# Patient Record
Sex: Male | Born: 1945 | Race: White | Hispanic: No | Marital: Married | Smoking: Former smoker
Health system: Southern US, Community
[De-identification: ages and names within clinical notes are randomized; demographics above are authoritative.]

## PROBLEM LIST (undated history)

## (undated) DIAGNOSIS — G809 Cerebral palsy, unspecified: Secondary | ICD-10-CM

## (undated) DIAGNOSIS — R1319 Other dysphagia: Secondary | ICD-10-CM

## (undated) DIAGNOSIS — G43909 Migraine, unspecified, not intractable, without status migrainosus: Secondary | ICD-10-CM

## (undated) DIAGNOSIS — A048 Other specified bacterial intestinal infections: Secondary | ICD-10-CM

## (undated) DIAGNOSIS — E7800 Pure hypercholesterolemia, unspecified: Secondary | ICD-10-CM

## (undated) DIAGNOSIS — I10 Essential (primary) hypertension: Secondary | ICD-10-CM

## (undated) HISTORY — DX: Other dysphagia: R13.19

## (undated) HISTORY — DX: Cerebral palsy, unspecified: G80.9

## (undated) HISTORY — DX: Pure hypercholesterolemia, unspecified: E78.00

## (undated) HISTORY — DX: Essential (primary) hypertension: I10

## (undated) HISTORY — DX: Other specified bacterial intestinal infections: A04.8

## (undated) HISTORY — PX: CHOLECYSTECTOMY: SHX55

## (undated) HISTORY — DX: Migraine, unspecified, not intractable, without status migrainosus: G43.909

---

## 2004-02-19 HISTORY — PX: LAPAROSCOPIC CHOLECYSTECTOMY: SUR755

## 2008-06-09 HISTORY — PX: PROSTATE SURGERY: SHX751

## 2009-12-15 HISTORY — PX: COLONOSCOPY: SHX174

## 2011-06-09 HISTORY — PX: TENDON REPAIR: SHX5111

## 2012-06-27 HISTORY — PX: ESOPHAGOGASTRODUODENOSCOPY: SHX1529

## 2015-09-06 DIAGNOSIS — M722 Plantar fascial fibromatosis: Secondary | ICD-10-CM

## 2015-09-06 HISTORY — DX: Plantar fascial fibromatosis: M72.2

## 2015-10-18 ENCOUNTER — Ambulatory Visit (INDEPENDENT_AMBULATORY_CARE_PROVIDER_SITE_OTHER): Payer: PRIVATE HEALTH INSURANCE | Admitting: Podiatry

## 2015-10-18 ENCOUNTER — Encounter: Payer: Self-pay | Admitting: Podiatry

## 2015-10-18 ENCOUNTER — Ambulatory Visit (INDEPENDENT_AMBULATORY_CARE_PROVIDER_SITE_OTHER): Payer: PRIVATE HEALTH INSURANCE

## 2015-10-18 VITALS — BP 150/86 | HR 67 | Resp 14

## 2015-10-18 DIAGNOSIS — R52 Pain, unspecified: Secondary | ICD-10-CM

## 2015-10-18 DIAGNOSIS — M722 Plantar fascial fibromatosis: Secondary | ICD-10-CM | POA: Diagnosis not present

## 2015-10-18 MED ORDER — MELOXICAM 15 MG PO TABS: 15.0000 mg | ORAL_TABLET | Freq: Every day | ORAL | Status: DC

## 2015-10-18 NOTE — Addendum Note (Signed)
Addended by: Ezzard Flax, Leidi Astle L on: 10/18/2015 04:59 PM   Modules accepted: Orders, Medications

## 2015-10-18 NOTE — Progress Notes (Addendum)
   Subjective:    Patient ID: Brian Sullivan, male    DOB: 1945/11/12, 70 y.o.   MRN: [id number]  HPI this patient presents to the office with chief complaint of pain in his left heel. Patient states that he started by having cramping through the plantar fascia extending into the big toe of the left foot initially. He then started to have pain around the outside of his left ankle. Finally, the pain has seemed to localize in the heel of his left foot. He states he has pain upon rising in the morning and standing from a sitting position. He says he was treated by Dr. Marcello Moores and was given an injection 2 months ago. He states  that did not resolve the problem. He was seen by physical therapist who provided needling  on his left leg and foot in an effort to relieve his pain. This patient says he worsened the condition. He presents the office today for an evaluation and treatment of this condition    Review of Systems  All other systems reviewed and are negative.      Objective:   Physical Exam GENERAL APPEARANCE: Alert, conversant. Appropriately groomed. No acute distress.  VASCULAR: Pedal pulses are  palpable at  [facility] and PT bilateral.  Capillary refill time is immediate to all digits,  Normal temperature gradient.  Digital hair growth is present bilateral  NEUROLOGIC: sensation is normal to 5.07 monofilament at 5/5 sites bilateral.  Light touch is intact bilateral, Muscle strength normal.  MUSCULOSKELETAL: acceptable muscle strength, tone and stability bilateral.  Intrinsic muscluature intact bilateral.  Rectus appearance of foot and digits noted bilateral. Palpable pain at the insertion plantar fascia left foot.  Mild hallux limitus 1st MPJ B/L. Midfoot exostosis B/L.  DERMATOLOGIC: skin color, texture, and turgor are within normal limits.  No preulcerative lesions or ulcers  are seen, no interdigital maceration noted.  No open lesions present.  Digital nails are asymptomatic. No drainage  noted.         Assessment & Plan:  Plantar fascitis left foot.   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  IE  Xray reveal no calcification at the insertion plantar fascia.  Injection therapy left heel.  Prescribe Mobic.  Powersteps were dispensed.   RTC 2 weeks.   Gardiner Barefoot DPM

## 2015-11-01 ENCOUNTER — Ambulatory Visit: Payer: PRIVATE HEALTH INSURANCE | Admitting: Podiatry

## 2017-09-25 ENCOUNTER — Ambulatory Visit: Payer: Self-pay | Admitting: Neurology

## 2017-11-02 ENCOUNTER — Encounter: Payer: Self-pay | Admitting: Neurology

## 2018-01-07 ENCOUNTER — Encounter: Payer: Self-pay | Admitting: Neurology

## 2018-01-07 ENCOUNTER — Ambulatory Visit: Payer: Commercial Managed Care - PPO | Admitting: Neurology

## 2018-01-07 ENCOUNTER — Encounter

## 2018-01-07 VITALS — BP 100/78 | HR 59 | Ht 71.0 in | Wt 201.0 lb

## 2018-01-07 DIAGNOSIS — G4440 Drug-induced headache, not elsewhere classified, not intractable: Secondary | ICD-10-CM | POA: Diagnosis not present

## 2018-01-07 DIAGNOSIS — G43109 Migraine with aura, not intractable, without status migrainosus: Secondary | ICD-10-CM | POA: Diagnosis not present

## 2018-01-07 DIAGNOSIS — G43809 Other migraine, not intractable, without status migrainosus: Secondary | ICD-10-CM

## 2018-01-07 DIAGNOSIS — M542 Cervicalgia: Secondary | ICD-10-CM

## 2018-01-07 DIAGNOSIS — M5030 Other cervical disc degeneration, unspecified cervical region: Secondary | ICD-10-CM | POA: Diagnosis not present

## 2018-01-07 NOTE — Patient Instructions (Signed)
1.  Continue topiramate 50mg  at bedtime 2.  When you get a vertigo attack, use the Zofran injection and propranolol if needed 3.  Limit use of pain relievers (Tylenol and ibuprofen for example) to no more than 2 days out of week to prevent rebound headache.  May also treat with caffeine (such as with Excedrin) also limited to no more than 2 days out of week. 4.  We will check MRI of cervical spine 5.  Keep headache diary 6.  Follow up in 3 months.
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
## 2018-01-08 ENCOUNTER — Encounter: Payer: Self-pay | Admitting: Neurology

## 2018-01-15 ENCOUNTER — Encounter: Payer: Self-pay | Admitting: Neurology

## 2018-05-01 ENCOUNTER — Ambulatory Visit: Payer: Commercial Managed Care - PPO | Admitting: Neurology
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
## 2018-05-03 ENCOUNTER — Ambulatory Visit (INDEPENDENT_AMBULATORY_CARE_PROVIDER_SITE_OTHER): Payer: Commercial Managed Care - PPO | Admitting: Neurology

## 2018-05-03 ENCOUNTER — Encounter: Payer: Self-pay | Admitting: Neurology

## 2018-05-03 VITALS — BP 118/86 | HR 63 | Ht 71.0 in | Wt 203.0 lb

## 2018-05-03 DIAGNOSIS — H8111 Benign paroxysmal vertigo, right ear: Secondary | ICD-10-CM

## 2018-05-03 DIAGNOSIS — G43109 Migraine with aura, not intractable, without status migrainosus: Secondary | ICD-10-CM | POA: Diagnosis not present

## 2018-05-03 DIAGNOSIS — G43809 Other migraine, not intractable, without status migrainosus: Secondary | ICD-10-CM

## 2018-05-03 NOTE — Patient Instructions (Signed)
1.  Continue topiramate 50mg  at bedtime 2.  Remember to limit use of pain relievers to no more than 2 days out of week to prevent risk of rebound or medication-overuse headache. 3.  Follow up as needed
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
## 2019-03-28 ENCOUNTER — Encounter: Payer: Self-pay | Admitting: Neurology

## 2019-03-28 ENCOUNTER — Telehealth (INDEPENDENT_AMBULATORY_CARE_PROVIDER_SITE_OTHER): Payer: Commercial Managed Care - PPO | Admitting: Neurology

## 2019-03-28 ENCOUNTER — Other Ambulatory Visit: Payer: Self-pay

## 2019-03-28 VITALS — BP 126/82 | HR 96 | Ht 71.0 in | Wt 203.0 lb

## 2019-03-28 DIAGNOSIS — G43109 Migraine with aura, not intractable, without status migrainosus: Secondary | ICD-10-CM

## 2019-03-28 DIAGNOSIS — G43809 Other migraine, not intractable, without status migrainosus: Secondary | ICD-10-CM

## 2019-03-28 MED ORDER — TOPIRAMATE 25 MG PO TABS: 25.0000 mg | ORAL_TABLET | Freq: Every day | ORAL | 1 refills | Status: DC
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
## 2019-04-24 ENCOUNTER — Other Ambulatory Visit: Payer: Self-pay | Admitting: Neurology

## 2019-04-24 ENCOUNTER — Telehealth (INDEPENDENT_AMBULATORY_CARE_PROVIDER_SITE_OTHER): Payer: Commercial Managed Care - PPO | Admitting: Neurology

## 2019-04-24 ENCOUNTER — Encounter: Payer: Self-pay | Admitting: Neurology

## 2019-04-24 ENCOUNTER — Other Ambulatory Visit: Payer: Self-pay

## 2019-04-24 VITALS — BP 118/64 | HR 80 | Ht 71.0 in | Wt 203.0 lb

## 2019-04-24 DIAGNOSIS — G43809 Other migraine, not intractable, without status migrainosus: Secondary | ICD-10-CM | POA: Diagnosis not present

## 2019-04-24 DIAGNOSIS — M542 Cervicalgia: Secondary | ICD-10-CM

## 2019-04-24 MED ORDER — TOPIRAMATE 50 MG PO TABS: 50.0000 mg | ORAL_TABLET | Freq: Every day | ORAL | 3 refills | Status: DC

## 2019-04-24 MED ORDER — TIZANIDINE HCL 2 MG PO TABS: ORAL_TABLET | ORAL | 3 refills | Status: DC

## 2019-04-24 MED ORDER — PROPRANOLOL HCL 10 MG PO TABS: ORAL_TABLET | ORAL | 3 refills | Status: DC

## 2019-04-24 NOTE — Telephone Encounter (Signed)
Requested Prescriptions   Pending Prescriptions Disp Refills  . propranolol (INDERAL) 10 MG tablet [Pharmacy Med Name: PROPRANOLOL 10MG ] 30 tablet 0    Sig: TAKE 1 TO 2 TABLETS ONCE DAILY AS NEEDED FOR TENSION HEADACHE.   Rx last filled: today #60 3 refills  Pt last seen:today  Follow up appt [id number]  Denied duplicate request

## 2019-04-25 DIAGNOSIS — M25461 Effusion, right knee: Secondary | ICD-10-CM

## 2019-04-25 HISTORY — PX: KNEE SURGERY: SHX244

## 2019-07-15 ENCOUNTER — Telehealth: Payer: Self-pay | Admitting: Neurology

## 2019-07-15 NOTE — Telephone Encounter (Signed)
Patient is sch for a virtual on 07-17-19

## 2019-07-15 NOTE — Telephone Encounter (Signed)
Patient's wife called to report a new symptom of dizziness with her husband's migraines. He'd like an in-person visit, per the caller. She is aware we are not seeing patients in the office at this time for the most part. She'd like to speak with a nurse about her concerns.

## 2019-07-15 NOTE — Telephone Encounter (Signed)
Dizziness started a month ago, blood pressure is normal. When patient lays down to go to sleep, when he turns his head quickly. New symptom and states weird feeling. No blurred vision, slight headache on occasion not at the same time. Please advise. Willing to do a VV.

## 2019-07-15 NOTE — Telephone Encounter (Signed)
Please schedule VV to discuss dizziness. Thank you

## 2019-07-15 NOTE — Telephone Encounter (Signed)
He has vestibular migraines which are migraines associated with dizziness. He can make a VV to discuss.

## 2019-07-16 ENCOUNTER — Encounter: Payer: Self-pay | Admitting: Neurology
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
## 2019-07-17 ENCOUNTER — Other Ambulatory Visit: Payer: Self-pay

## 2019-07-17 ENCOUNTER — Encounter: Payer: Self-pay | Admitting: Neurology

## 2019-07-17 ENCOUNTER — Telehealth (INDEPENDENT_AMBULATORY_CARE_PROVIDER_SITE_OTHER): Payer: Commercial Managed Care - PPO | Admitting: Neurology

## 2019-07-17 VITALS — Ht 71.0 in

## 2019-07-17 DIAGNOSIS — M542 Cervicalgia: Secondary | ICD-10-CM

## 2019-07-17 DIAGNOSIS — H8111 Benign paroxysmal vertigo, right ear: Secondary | ICD-10-CM

## 2019-07-17 DIAGNOSIS — G43809 Other migraine, not intractable, without status migrainosus: Secondary | ICD-10-CM

## 2019-09-03 ENCOUNTER — Telehealth: Payer: Commercial Managed Care - PPO | Admitting: Neurology

## 2019-11-07 ENCOUNTER — Ambulatory Visit: Payer: Commercial Managed Care - PPO | Admitting: Neurology

## 2020-02-09 ENCOUNTER — Telehealth: Payer: Self-pay | Admitting: Neurology

## 2020-02-09 NOTE — Telephone Encounter (Signed)
Patients wife called to schedule a follow up visit, was upset to find Dr. Georgie Sullivan first availability is December. She states her husband is having a flare up- he is vomiting and having headaches. They almost went to the ER/ She would like him to be seen "before he dies" -  I added patients appointment to the wait list.

## 2020-02-27 ENCOUNTER — Encounter: Payer: Self-pay | Admitting: Neurology

## 2020-04-05 ENCOUNTER — Encounter: Payer: Self-pay | Admitting: Cardiology

## 2020-04-05 DIAGNOSIS — S46219A Strain of muscle, fascia and tendon of other parts of biceps, unspecified arm, initial encounter: Secondary | ICD-10-CM

## 2020-04-05 DIAGNOSIS — K219 Gastro-esophageal reflux disease without esophagitis: Secondary | ICD-10-CM

## 2020-04-05 DIAGNOSIS — R2689 Other abnormalities of gait and mobility: Secondary | ICD-10-CM

## 2020-04-05 DIAGNOSIS — F419 Anxiety disorder, unspecified: Secondary | ICD-10-CM

## 2020-04-05 DIAGNOSIS — N3289 Other specified disorders of bladder: Secondary | ICD-10-CM

## 2020-04-05 DIAGNOSIS — N400 Enlarged prostate without lower urinary tract symptoms: Secondary | ICD-10-CM

## 2020-04-05 DIAGNOSIS — C4490 Unspecified malignant neoplasm of skin, unspecified: Secondary | ICD-10-CM | POA: Insufficient documentation

## 2020-04-05 DIAGNOSIS — N5314 Retrograde ejaculation: Secondary | ICD-10-CM

## 2020-04-05 DIAGNOSIS — G809 Cerebral palsy, unspecified: Secondary | ICD-10-CM

## 2020-04-05 DIAGNOSIS — K589 Irritable bowel syndrome without diarrhea: Secondary | ICD-10-CM

## 2020-04-05 DIAGNOSIS — N419 Inflammatory disease of prostate, unspecified: Secondary | ICD-10-CM

## 2020-04-05 DIAGNOSIS — E559 Vitamin D deficiency, unspecified: Secondary | ICD-10-CM

## 2020-04-05 DIAGNOSIS — S46219D Strain of muscle, fascia and tendon of other parts of biceps, unspecified arm, subsequent encounter: Secondary | ICD-10-CM

## 2020-04-05 DIAGNOSIS — I119 Hypertensive heart disease without heart failure: Secondary | ICD-10-CM

## 2020-04-05 DIAGNOSIS — E785 Hyperlipidemia, unspecified: Secondary | ICD-10-CM | POA: Insufficient documentation

## 2020-04-05 DIAGNOSIS — N401 Enlarged prostate with lower urinary tract symptoms: Secondary | ICD-10-CM

## 2020-04-05 DIAGNOSIS — Z79899 Other long term (current) drug therapy: Secondary | ICD-10-CM

## 2020-04-05 DIAGNOSIS — E291 Testicular hypofunction: Secondary | ICD-10-CM

## 2020-04-05 DIAGNOSIS — I2510 Atherosclerotic heart disease of native coronary artery without angina pectoris: Secondary | ICD-10-CM

## 2020-04-05 HISTORY — DX: Other specified disorders of bladder: N32.89

## 2020-04-05 HISTORY — DX: Gastro-esophageal reflux disease without esophagitis: K21.9

## 2020-04-05 HISTORY — DX: Strain of muscle, fascia and tendon of other parts of biceps, unspecified arm, initial encounter: S46.219A

## 2020-04-05 HISTORY — DX: Anxiety disorder, unspecified: F41.9

## 2020-04-05 HISTORY — DX: Hypertensive heart disease without heart failure: I11.9

## 2020-04-05 HISTORY — DX: Inflammatory disease of prostate, unspecified: N41.9

## 2020-04-05 HISTORY — DX: Benign prostatic hyperplasia with lower urinary tract symptoms: N40.1

## 2020-04-05 HISTORY — DX: Unspecified malignant neoplasm of skin, unspecified: C44.90

## 2020-04-05 HISTORY — DX: Atherosclerotic heart disease of native coronary artery without angina pectoris: I25.10

## 2020-04-05 HISTORY — DX: Irritable bowel syndrome, unspecified: K58.9

## 2020-04-05 HISTORY — DX: Other abnormalities of gait and mobility: R26.89

## 2020-04-05 HISTORY — DX: Benign prostatic hyperplasia without lower urinary tract symptoms: N40.0

## 2020-04-05 HISTORY — DX: Vitamin D deficiency, unspecified: E55.9

## 2020-04-05 HISTORY — DX: Retrograde ejaculation: N53.14

## 2020-04-05 HISTORY — DX: Hyperlipidemia, unspecified: E78.5

## 2020-04-05 HISTORY — DX: Other long term (current) drug therapy: Z79.899

## 2020-04-05 HISTORY — DX: Testicular hypofunction: E29.1

## 2020-04-06 ENCOUNTER — Telehealth: Payer: Self-pay

## 2020-04-06 DIAGNOSIS — I10 Essential (primary) hypertension: Secondary | ICD-10-CM | POA: Insufficient documentation

## 2020-04-06 DIAGNOSIS — E7800 Pure hypercholesterolemia, unspecified: Secondary | ICD-10-CM | POA: Insufficient documentation

## 2020-04-06 DIAGNOSIS — G43909 Migraine, unspecified, not intractable, without status migrainosus: Secondary | ICD-10-CM | POA: Insufficient documentation

## 2020-04-06 NOTE — Telephone Encounter (Signed)
   Primary Cardiologist: New  Chart reviewed as part of pre-operative protocol coverage. Because of Brian Sullivan past medical history and time since last visit, they will require a follow-up visit in order to better assess preoperative cardiovascular risk.  Pre-op covering staff: - Please clarify if he follows with Dr. Delena Bali in [place] (I see the name listed in his chart). If he does not have a cardiologist, please schedule appointment and call patient to inform them.  - Please contact requesting surgeon's office via preferred method (i.e, phone, fax) to inform them of need for appointment prior to surgery.  Abigail Butts, PA-C  04/06/2020, 4:59 PM

## 2020-04-06 NOTE — Telephone Encounter (Signed)
   [facility] Pre-operative Risk Assessment    HEARTCARE STAFF: - Please ensure there is not already an duplicate clearance open for this procedure. - Under Visit Info/Reason for Call, type in Other and utilize the format Clearance MM/DD/YY or Clearance TBD. Do not use dashes or single digits. - If request is for dental extraction, please clarify the # of teeth to be extracted.  Request for surgical clearance:  1. What type of surgery is being performed? Right total knee arthroplasty   2. When is this surgery scheduled? TBD   3. What type of clearance is required (medical clearance vs. Pharmacy clearance to hold med vs. Both)? Medical  4. Are there any medications that need to be held prior to surgery and how long?   5. Practice name and name of physician performing surgery? [facility]-Dr. Joya Salm  6. What is the office phone number? [phone]   7.   What is the office fax number? [phone]  8.   Anesthesia type (None, local, MAC, general) ?    Lowella Grip 04/06/2020, 4:01 PM  _________________________________________________________________   (provider comments below)

## 2020-04-06 NOTE — Telephone Encounter (Signed)
Patient has a new patient appointment scheduled for tomorrow 04/07/2020 with Dr Harriet Masson.

## 2020-04-06 NOTE — Telephone Encounter (Signed)
Faxed back to requesting surgeons office to make them aware.

## 2020-04-07 ENCOUNTER — Encounter: Payer: Self-pay | Admitting: Cardiology

## 2020-04-07 ENCOUNTER — Other Ambulatory Visit: Payer: Self-pay

## 2020-04-07 ENCOUNTER — Ambulatory Visit (INDEPENDENT_AMBULATORY_CARE_PROVIDER_SITE_OTHER): Payer: Commercial Managed Care - PPO | Admitting: Cardiology

## 2020-04-07 VITALS — BP 137/85 | HR 78 | Ht 71.0 in | Wt 199.4 lb

## 2020-04-07 DIAGNOSIS — I2510 Atherosclerotic heart disease of native coronary artery without angina pectoris: Secondary | ICD-10-CM

## 2020-04-07 DIAGNOSIS — R011 Cardiac murmur, unspecified: Secondary | ICD-10-CM | POA: Diagnosis not present

## 2020-04-07 DIAGNOSIS — R9431 Abnormal electrocardiogram [ECG] [EKG]: Secondary | ICD-10-CM | POA: Insufficient documentation

## 2020-04-07 DIAGNOSIS — I10 Essential (primary) hypertension: Secondary | ICD-10-CM

## 2020-04-07 DIAGNOSIS — Z01818 Encounter for other preprocedural examination: Secondary | ICD-10-CM | POA: Diagnosis not present

## 2020-04-07 DIAGNOSIS — E782 Mixed hyperlipidemia: Secondary | ICD-10-CM

## 2020-04-07 NOTE — Progress Notes (Signed)
Cardiology Office Note:    Date:  04/07/2020   ID:  Brian Sullivan, DOB [birth year], MRN [id number]  PCP:  Brian Dress, MD  Cardiologist:  No primary care provider on file.  Electrophysiologist:  None   Referring MD: Brian Salm, MD   " I am here for a preoperative clearance"  History of Present Illness:    Brian Sullivan is a 74 y.o. male with a hx of of CAD status post stents to the RCA back in [place] many years ago, at that time he was told that he had 40% circumflex and 40% LAD lesion, fast 14 2015 the patient did see Dr. Agustin Sullivan and had a left heart catheterization in 2015 which showed very minimal coronary artery disease with a patent RCA.  Other medical history includes hypertension, hyperlipidemia GERD.  The patient was recently vaccinated with COVID-19 vaccine.  He is here today at the request of his PCP as he is planning total knee replacement.  He is with his wife today.  He has had limited activity due to his knee pain.  Prior to this he was a very active man who ran pretty much every day.  He denies any chest pain.  He tells me that his chest pain from his first event the only other time he is ever had chest pain when he took high-dose Viagra.  No other complaints at this time.    Past Medical History:  Diagnosis Date  . Anxiety 04/05/2020  . Balance problem 04/05/2020  . Benign hypertensive heart disease without heart failure 04/05/2020  . Benign non-nodular prostatic hyperplasia 04/05/2020  . CAD (coronary artery disease) 04/05/2020  . Cerebral palsy ([place])   . Gastro-esophageal reflux 04/05/2020  . High risk medication use 04/05/2020  . Hypercholesteremia   . Hyperlipemia 04/05/2020  . Hyperplasia of prostate with lower urinary tract symptoms (LUTS) 04/05/2020  . Hypertension   . 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 IBS (irritable bowel syndrome) 04/05/2020  . Irritable bladder 04/05/2020  . Migraines   . Plantar fasciitis, left 09/06/2015  . Prostatitis  04/05/2020  . 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 Rupture of distal biceps tendon 04/05/2020  . Skin cancer 04/05/2020  . Vitamin D deficiency 04/05/2020    Past Surgical History:  Procedure Laterality Date  . CHOLECYSTECTOMY    . KNEE SURGERY Right 04/25/2019   1.Partial medial meniscectomy 2.Chondroplasty medical femoral  . TENDON REPAIR Right 06/09/2011   Open right distal biceps tendon repair    Current Medications: Current Meds  Medication Sig  . acetaminophen (TYLENOL) 325 MG tablet Take 650 mg by mouth as needed.  Marland Kitchen amitriptyline (ELAVIL) 25 MG tablet Take 25 mg by mouth daily.   Marland Kitchen aspirin EC 81 MG tablet Take 81 mg by mouth as needed.   . celecoxib (CELEBREX) 200 MG capsule Take 200 mg by mouth 2 (two) times daily.  . diclofenac (VOLTAREN) 75 MG EC tablet Take 75 mg by mouth 2 (two) times daily.  Marland Kitchen esomeprazole (NEXIUM) 40 MG capsule Take 40 mg by mouth 2 (two) times daily before a meal.   . levothyroxine (SYNTHROID) 50 MCG tablet Take 50 mcg by mouth daily.  . montelukast (SINGULAIR) 10 MG tablet Take 10 mg by mouth at bedtime.  . propranolol (INDERAL) 10 MG tablet TAKE 1 TO 2 TABLETS ONCE DAILY AS NEEDED FOR TENSION HEADACHE.  . tadalafil (CIALIS) 5 MG tablet Take 5 mg by mouth daily as needed for erectile dysfunction.  . tamsulosin (FLOMAX)  0.4 MG CAPS capsule Take 0.4 mg by mouth daily.  . Vitamin D, Ergocalciferol, (DRISDOL) 1.25 MG (50000 UT) CAPS capsule TAKE 1 CAPSULE ONCE A WEEK     Allergies:   Gemfibrozil, Paroxetine hcl, Statins, and Sulfamethoxazole   Social History   Socioeconomic History  . Marital status: Married    Spouse name: Brian Sullivan  . Number of children: 1  . Years of education: Not on file  . Highest education level: Some college, no degree  Occupational History  . Occupation: retired  Tobacco Use  . Smoking status: Former Smoker    Quit date: 04/05/1970    Years since quitting: 50.0  . Smokeless tobacco: Never Used  Vaping Use  . Vaping Use:  Never used  Substance and Sexual Activity  . Alcohol use: Yes    Alcohol/week: 0.0 standard drinks    Comment: occas.  . Drug use: No  . Sexual activity: Not on file  Other Topics Concern  . Not on file  Social History Narrative   Patient is right-handed. He lives with his wife in a 1 story house. He drinks 1 cup of coffee day, he does not exercise. Volunteers at hospital.    Social Determinants of Health   Financial Resource Strain:   . Difficulty of Paying Living Expenses: Not on file  Food Insecurity:   . Worried About Charity fundraiser in the Last Year: Not on file  . Ran Out of Food in the Last Year: Not on file  Transportation Needs:   . Lack of Transportation (Medical): Not on file  . Lack of Transportation (Non-Medical): Not on file  Physical Activity:   . Days of Exercise per Week: Not on file  . Minutes of Exercise per Session: Not on file  Stress:   . Feeling of Stress : Not on file  Social Connections:   . Frequency of Communication with Friends and Family: Not on file  . Frequency of Social Gatherings with Friends and Family: Not on file  . Attends Religious Services: Not on file  . Active Member of Clubs or Organizations: Not on file  . Attends Archivist Meetings: Not on file  . Marital Status: Not on file     Family History: The patient's family history includes CAD in his father and mother; COPD in his mother; Emphysema in his mother; Headache in his mother; Heart attack in his father; Hypertension in his father and mother.  ROS:   Review of Systems  Constitution: Negative for decreased appetite, fever and weight gain.  HENT: Negative for congestion, ear discharge, hoarse voice and sore throat.   Eyes: Negative for discharge, redness, vision loss in right eye and visual halos.  Cardiovascular: Negative for chest pain, dyspnea on exertion, leg swelling, orthopnea and palpitations.  Respiratory: Negative for cough, hemoptysis, shortness of  breath and snoring.   Endocrine: Negative for heat intolerance and polyphagia.  Hematologic/Lymphatic: Negative for bleeding problem. Does not bruise/bleed easily.  Skin: Negative for flushing, nail changes, rash and suspicious lesions.  Musculoskeletal: Negative for arthritis, joint pain, muscle cramps, myalgias, neck pain and stiffness.  Gastrointestinal: Negative for abdominal pain, bowel incontinence, diarrhea and excessive appetite.  Genitourinary: Negative for decreased libido, genital sores and incomplete emptying.  Neurological: Negative for brief paralysis, focal weakness, headaches and loss of balance.  Psychiatric/Behavioral: Negative for altered mental status, depression and suicidal ideas.  Allergic/Immunologic: Negative for HIV exposure and persistent infections.    EKGs/Labs/Other Studies Reviewed:  The following studies were reviewed today:   EKG:  The ekg ordered today demonstrates  sinus rhythm, heart rate 70 bpm with abnormal T waves in the lateral leads suggesting lateral wall ischemia.   Recent Labs: No results found for requested labs within last 8760 hours.  Recent Lipid Panel No results found for: CHOL, TRIG, HDL, CHOLHDL, VLDL, LDLCALC, LDLDIRECT  Physical Exam:    VS:  BP 137/85   Pulse 78   Ht 5\' 11"  (1.803 m)   Wt 199 lb 6.4 oz (90.4 kg)   SpO2 98%   BMI 27.81 kg/m     Wt Readings from Last 3 Encounters:  04/07/20 199 lb 6.4 oz (90.4 kg)  04/01/20 199 lb (90.3 kg)  04/24/19 203 lb (92.1 kg)     GEN: Well nourished, well developed in no acute distress HEENT: Normal NECK: No JVD; No carotid bruits LYMPHATICS: No lymphadenopathy CARDIAC: S1S2 noted,RRR, no murmurs, rubs, gallops RESPIRATORY:  Clear to auscultation without rales, wheezing or rhonchi  ABDOMEN: Soft, non-tender, non-distended, +bowel sounds, no guarding. EXTREMITIES: No edema, No cyanosis, no clubbing MUSCULOSKELETAL:  No deformity  SKIN: Warm and dry NEUROLOGIC:  Alert and  oriented x 3, non-focal PSYCHIATRIC:  Normal affect, good insight  ASSESSMENT:    1. Pre-op evaluation   2. Murmur, cardiac   3. Coronary artery disease involving native heart without angina pectoris, unspecified vessel or lesion type   4. Abnormal EKG   5. Essential hypertension   6. Mixed hyperlipidemia    PLAN:     The patient does have low at exercise capacity due to his knee pain as well as abnormal EKG.  Therefore I like to proceed with a stress test prior to his surgery to rule out any progression of his coronary artery disease prior to his surgery.  In addition his physical exam show evidence of low pitched systolic murmur like to get an echocardiogram to assess for any structural abnormalities.  He is statin intolerant and now is on Crestor 5 mg per patient.   He was on Repatha in the past which was stopped due to cost.  He recently had blood work with his PCP, I requested a copy so I can review it.  His LDL target is less than 70 if this is higher he will need to have Zetia added to his regimen and he may need to get LP(a) to reassess the need for Repatha use.  Blood pressures acceptable in the office no changes.   The patient is in agreement with the above plan. The patient left the office in stable condition.  The patient will follow up in     Medication Adjustments/Labs and Tests Ordered: Current medicines are reviewed at length with the patient today.  Concerns regarding medicines are outlined above.  Orders Placed This Encounter  Procedures  . MYOCARDIAL PERFUSION IMAGING  . EKG 12-Lead  . ECHOCARDIOGRAM COMPLETE   No orders of the defined types were placed in this encounter.   Patient Instructions  Medication Instructions:  No medication changes. *If you need a refill on your cardiac medications before your next appointment, please call your pharmacy*   Lab Work: None ordered If you have labs (blood work) drawn today and your tests are completely normal,  you will receive your results only by: Marland Kitchen MyChart Message (if you have MyChart) OR . A paper copy in the mail If you have any lab test that is abnormal or we need to change your treatment,  we will call you to review the results.   Testing/Procedures: Your physician has requested that you have an echocardiogram. Echocardiography is a painless test that uses sound waves to create images of your heart. It provides your doctor with information about the size and shape of your heart and how well your heart's chambers and valves are working. This procedure takes approximately one hour. There are no restrictions for this procedure.  Your physician has requested that you have a lexiscan myoview. For further information please visit [email or web address]. Please follow instruction sheet, as given.  The test will take approximately 3 to 4 hours to complete; you may bring reading material.  If someone comes with you to your appointment, they will need to remain in the main lobby due to limited space in the testing area.   How to prepare for your Myocardial Perfusion Test: . Do not eat or drink 3 hours prior to your test, except you may have water. . Do not consume products containing caffeine (regular or decaffeinated) 12 hours prior to your test. (ex: coffee, chocolate, sodas, tea). . Do bring a list of your current medications with you.  If not listed below, you may take your medications as normal. . Do wear comfortable clothes (no dresses or overalls) and walking shoes, tennis shoes preferred (No heels or open toe shoes are allowed). . Do NOT wear cologne, perfume, aftershave, or lotions (deodorant is allowed). . If these instructions are not followed, your test will have to be rescheduled.  Follow-Up: At [facility], you and your health needs are our priority.  As part of our continuing mission to provide you with exceptional heart care, we have created designated Provider Care Teams.  These Care  Teams include your primary Cardiologist (physician) and Advanced Practice Providers (APPs -  Physician Assistants and Nurse Practitioners) who all work together to provide you with the care you need, when you need it.  We recommend signing up for the patient portal called "MyChart".  Sign up information is provided on this After Visit Summary.  MyChart is used to connect with patients for Virtual Visits (Telemedicine).  Patients are able to view lab/test results, encounter notes, upcoming appointments, etc.  Non-urgent messages can be sent to your provider as well.   To learn more about what you can do with MyChart, go to [email or web address].    Your next appointment:   3 month(s)  The format for your next appointment:   In Person  Provider:   Berniece Salines, DO   Other Instructions  Echocardiogram An echocardiogram is a procedure that uses painless sound waves (ultrasound) to produce an image of the heart. Images from an echocardiogram can provide important information about:  Signs of coronary artery disease (CAD).  Aneurysm detection. An aneurysm is a weak or damaged part of an artery wall that bulges out from the normal force of blood pumping through the body.  Heart size and shape. Changes in the size or shape of the heart can be associated with certain conditions, including heart failure, aneurysm, and CAD.  Heart muscle function.  Heart valve function.  Signs of a past heart attack.  Fluid buildup around the heart.  Thickening of the heart muscle.  A tumor or infectious growth around the heart valves. Tell a health care provider about:  Any allergies you have.  All medicines you are taking, including vitamins, herbs, eye drops, creams, and over-the-counter medicines.  Any blood disorders you have.  Any surgeries you have had.  Any medical conditions you have.  Whether you are pregnant or may be pregnant. What are the risks? Generally, this is a safe  procedure. However, problems may occur, including:  Allergic reaction to dye (contrast) that may be used during the procedure. What happens before the procedure? No specific preparation is needed. You may eat and drink normally. What happens during the procedure?   An IV tube may be inserted into one of your veins.  You may receive contrast through this tube. A contrast is an injection that improves the quality of the pictures from your heart.  A gel will be applied to your chest.  A wand-like tool (transducer) will be moved over your chest. The gel will help to transmit the sound waves from the transducer.  The sound waves will harmlessly bounce off of your heart to allow the heart images to be captured in real-time motion. The images will be recorded on a computer. The procedure may vary among health care providers and hospitals. What happens after the procedure?  You may return to your normal, everyday life, including diet, activities, and medicines, unless your health care provider tells you not to do that. Summary  An echocardiogram is a procedure that uses painless sound waves (ultrasound) to produce an image of the heart.  Images from an echocardiogram can provide important information about the size and shape of your heart, heart muscle function, heart valve function, and fluid buildup around your heart.  You do not need to do anything to prepare before this procedure. You may eat and drink normally.  After the echocardiogram is completed, you may return to your normal, everyday life, unless your health care provider tells you not to do that. This information is not intended to replace advice given to you by your health care provider. Make sure you discuss any questions you have with your health care provider. Document Revised: 10/17/2018 Document Reviewed: 07/29/2016 Elsevier Patient Education  2020 [place].  Cardiac Nuclear Scan A cardiac nuclear scan is a test that  is done to check the flow of blood to your heart. It is done when you are resting and when you are exercising. The test looks for problems such as:  Not enough blood reaching a portion of the heart.  The heart muscle not working as it should. You may need this test if:  You have heart disease.  You have had lab results that are not normal.  You have had heart surgery or a balloon procedure to open up blocked arteries (angioplasty).  You have chest pain.  You have shortness of breath. In this test, a special dye (tracer) is put into your bloodstream. The tracer will travel to your heart. A camera will then take pictures of your heart to see how the tracer moves through your heart. This test is usually done at a hospital and takes 2-4 hours. Tell a doctor about:  Any allergies you have.  All medicines you are taking, including vitamins, herbs, eye drops, creams, and over-the-counter medicines.  Any problems you or family members have had with anesthetic medicines.  Any blood disorders you have.  Any surgeries you have had.  Any medical conditions you have.  Whether you are pregnant or may be pregnant. What are the risks? Generally, this is a safe test. However, problems may occur, such as:  Serious chest pain and heart attack. This is only a risk if the stress portion of the test is done.  Rapid heartbeat.  A feeling of warmth in your chest. This feeling usually does not last long.  Allergic reaction to the tracer. What happens before the test?  Ask your doctor about changing or stopping your normal medicines. This is important.  Follow instructions from your doctor about what you cannot eat or drink.  Remove your jewelry on the day of the test. What happens during the test?  An IV tube will be inserted into one of your veins.  Your doctor will give you a small amount of tracer through the IV tube.  You will wait for 20-40 minutes while the tracer moves through  your bloodstream.  Your heart will be monitored with an electrocardiogram (ECG).  You will lie down on an exam table.  Pictures of your heart will be taken for about 15-20 minutes.  You may also have a stress test. For this test, one of these things may be done: ? You will be asked to exercise on a treadmill or a stationary bike. ? You will be given medicines that will make your heart work harder. This is done if you are unable to exercise.  When blood flow to your heart has peaked, a tracer will again be given through the IV tube.  After 20-40 minutes, you will get back on the exam table. More pictures will be taken of your heart.  Depending on the tracer that is used, more pictures may need to be taken 3-4 hours later.  Your IV tube will be removed when the test is over. The test may vary among doctors and hospitals. What happens after the test?  Ask your doctor: ? Whether you can return to your normal schedule, including diet, activities, and medicines. ? Whether you should drink more fluids. This will help to remove the tracer from your body. Drink enough fluid to keep your pee (urine) pale yellow.  Ask your doctor, or the department that is doing the test: ? When will my results be ready? ? How will I get my results? Summary  A cardiac nuclear scan is a test that is done to check the flow of blood to your heart.  Tell your doctor whether you are pregnant or may be pregnant.  Before the test, ask your doctor about changing or stopping your normal medicines. This is important.  Ask your doctor whether you can return to your normal activities. You may be asked to drink more fluids. This information is not intended to replace advice given to you by your health care provider. Make sure you discuss any questions you have with your health care provider. Document Revised: 10/16/2018 Document Reviewed: 12/10/2017 Elsevier Patient Education  [place].      Adopting a  Healthy Lifestyle.  Know what a healthy weight is for you (roughly BMI <25) and aim to maintain this   Aim for 7+ servings of fruits and vegetables daily   65-80+ fluid ounces of water or unsweet tea for healthy kidneys   Limit to max 1 drink of alcohol per day; avoid smoking/tobacco   Limit animal fats in diet for cholesterol and heart health - choose grass fed whenever available   Avoid highly processed foods, and foods high in saturated/trans fats   Aim for low stress - take time to unwind and care for your mental health   Aim for 150 min of moderate intensity exercise weekly for heart health, and weights twice weekly for bone health   Aim for 7-9 hours of sleep daily  When it comes to diets, agreement about the perfect plan isnt easy to find, even among the experts. Experts at the [employer] developed an idea known as the Healthy Eating Plate. Just imagine a plate divided into logical, healthy portions.   The emphasis is on diet quality:   Load up on vegetables and fruits - one-half of your plate: Aim for color and variety, and remember that potatoes dont count.   Go for whole grains - one-quarter of your plate: Whole wheat, barley, wheat berries, quinoa, oats, brown rice, and foods made with them. If you want pasta, go with whole wheat pasta.   Protein power - one-quarter of your plate: Fish, chicken, beans, and nuts are all healthy, versatile protein sources. Limit red meat.   The diet, however, does go beyond the plate, offering a few other suggestions.   Use healthy plant oils, such as olive, canola, soy, corn, sunflower and peanut. Check the labels, and avoid partially hydrogenated oil, which have unhealthy trans fats.   If youre thirsty, drink water. Coffee and tea are good in moderation, but skip sugary drinks and limit milk and dairy products to one or two daily servings.   The type of carbohydrate in the diet is more important than the  amount. Some sources of carbohydrates, such as vegetables, fruits, whole grains, and beans-are healthier than others.   Finally, stay active  Signed, Berniece Salines, DO  04/07/2020 3:21 PM    [facility]

## 2020-04-07 NOTE — Patient Instructions (Signed)
Medication Instructions:  No medication changes. *If you need a refill on your cardiac medications before your next appointment, please call your pharmacy*   Lab Work: None ordered If you have labs (blood work) drawn today and your tests are completely normal, you will receive your results only by:  [place] (if you have MyChart) OR  A paper copy in the mail If you have any lab test that is abnormal or we need to change your treatment, we will call you to review the results.   Testing/Procedures: Your physician has requested that you have an echocardiogram. Echocardiography is a painless test that uses sound waves to create images of your heart. It provides your doctor with information about the size and shape of your heart and how well your hearts chambers and valves are working. This procedure takes approximately one hour. There are no restrictions for this procedure.  Your physician has requested that you have a lexiscan myoview. For further information please visit [email or web address]. Please follow instruction sheet, as given.  The test will take approximately 3 to 4 hours to complete; you may bring reading material.  If someone comes with you to your appointment, they will need to remain in the main lobby due to limited space in the testing area.   How to prepare for your Myocardial Perfusion Test:  Do not eat or drink 3 hours prior to your test, except you may have water.  Do not consume products containing caffeine (regular or decaffeinated) 12 hours prior to your test. (ex: coffee, chocolate, sodas, tea).  Do bring a list of your current medications with you.  If not listed below, you may take your medications as normal.  Do wear comfortable clothes (no dresses or overalls) and walking shoes, tennis shoes preferred (No heels or open toe shoes are allowed).  Do NOT wear cologne, perfume, aftershave, or lotions (deodorant is allowed).  If these instructions are not  followed, your test will have to be rescheduled.  Follow-Up: At [facility], you and your health needs are our priority.  As part of our continuing mission to provide you with exceptional heart care, we have created designated Provider Care Teams.  These Care Teams include your primary Cardiologist (physician) and Advanced Practice Providers (APPs -  Physician Assistants and Nurse Practitioners) who all work together to provide you with the care you need, when you need it.  We recommend signing up for the patient portal called "MyChart".  Sign up information is provided on this After Visit Summary.  MyChart is used to connect with patients for Virtual Visits (Telemedicine).  Patients are able to view lab/test results, encounter notes, upcoming appointments, etc.  Non-urgent messages can be sent to your provider as well.   To learn more about what you can do with MyChart, go to [email or web address].    Your next appointment:   3 month(s)  The format for your next appointment:   In Person  Provider:   Berniece Salines, DO   Other Instructions  Echocardiogram An echocardiogram is a procedure that uses painless sound waves (ultrasound) to produce an image of the heart. Images from an echocardiogram can provide important information about:  Signs of coronary artery disease (CAD).  Aneurysm detection. An aneurysm is a weak or damaged part of an artery wall that bulges out from the normal force of blood pumping through the body.  Heart size and shape. Changes in the size or shape of the heart can be associated with certain conditions,  including heart failure, aneurysm, and CAD.  Heart muscle function.  Heart valve function.  Signs of a past heart attack.  Fluid buildup around the heart.  Thickening of the heart muscle.  A tumor or infectious growth around the heart valves. Tell a health care provider about:  Any allergies you have.  All medicines you are taking, including  vitamins, herbs, eye drops, creams, and over-the-counter medicines.  Any blood disorders you have.  Any surgeries you have had.  Any medical conditions you have.  Whether you are pregnant or may be pregnant. What are the risks? Generally, this is a safe procedure. However, problems may occur, including:  Allergic reaction to dye (contrast) that may be used during the procedure. What happens before the procedure? No specific preparation is needed. You may eat and drink normally. What happens during the procedure?   An IV tube may be inserted into one of your veins.  You may receive contrast through this tube. A contrast is an injection that improves the quality of the pictures from your heart.  A gel will be applied to your chest.  A wand-like tool (transducer) will be moved over your chest. The gel will help to transmit the sound waves from the transducer.  The sound waves will harmlessly bounce off of your heart to allow the heart images to be captured in real-time motion. The images will be recorded on a computer. The procedure may vary among health care providers and hospitals. What happens after the procedure?  You may return to your normal, everyday life, including diet, activities, and medicines, unless your health care provider tells you not to do that. Summary  An echocardiogram is a procedure that uses painless sound waves (ultrasound) to produce an image of the heart.  Images from an echocardiogram can provide important information about the size and shape of your heart, heart muscle function, heart valve function, and fluid buildup around your heart.  You do not need to do anything to prepare before this procedure. You may eat and drink normally.  After the echocardiogram is completed, you may return to your normal, everyday life, unless your health care provider tells you not to do that. This information is not intended to replace advice given to you by your health  care provider. Make sure you discuss any questions you have with your health care provider. Document Revised: 10/17/2018 Document Reviewed: 07/29/2016 Elsevier Patient Education  2020 [place].  Cardiac Nuclear Scan A cardiac nuclear scan is a test that is done to check the flow of blood to your heart. It is done when you are resting and when you are exercising. The test looks for problems such as:  Not enough blood reaching a portion of the heart.  The heart muscle not working as it should. You may need this test if:  You have heart disease.  You have had lab results that are not normal.  You have had heart surgery or a balloon procedure to open up blocked arteries (angioplasty).  You have chest pain.  You have shortness of breath. In this test, a special dye (tracer) is put into your bloodstream. The tracer will travel to your heart. A camera will then take pictures of your heart to see how the tracer moves through your heart. This test is usually done at a hospital and takes 2-4 hours. Tell a doctor about:  Any allergies you have.  All medicines you are taking, including vitamins, herbs, eye drops, creams, and  over-the-counter medicines.  Any problems you or family members have had with anesthetic medicines.  Any blood disorders you have.  Any surgeries you have had.  Any medical conditions you have.  Whether you are pregnant or may be pregnant. What are the risks? Generally, this is a safe test. However, problems may occur, such as:  Serious chest pain and heart attack. This is only a risk if the stress portion of the test is done.  Rapid heartbeat.  A feeling of warmth in your chest. This feeling usually does not last long.  Allergic reaction to the tracer. What happens before the test?  Ask your doctor about changing or stopping your normal medicines. This is important.  Follow instructions from your doctor about what you cannot eat or drink.  Remove  your jewelry on the day of the test. What happens during the test?  An IV tube will be inserted into one of your veins.  Your doctor will give you a small amount of tracer through the IV tube.  You will wait for 20-40 minutes while the tracer moves through your bloodstream.  Your heart will be monitored with an electrocardiogram (ECG).  You will lie down on an exam table.  Pictures of your heart will be taken for about 15-20 minutes.  You may also have a stress test. For this test, one of these things may be done: ? You will be asked to exercise on a treadmill or a stationary bike. ? You will be given medicines that will make your heart work harder. This is done if you are unable to exercise.  When blood flow to your heart has peaked, a tracer will again be given through the IV tube.  After 20-40 minutes, you will get back on the exam table. More pictures will be taken of your heart.  Depending on the tracer that is used, more pictures may need to be taken 3-4 hours later.  Your IV tube will be removed when the test is over. The test may vary among doctors and hospitals. What happens after the test?  Ask your doctor: ? Whether you can return to your normal schedule, including diet, activities, and medicines. ? Whether you should drink more fluids. This will help to remove the tracer from your body. Drink enough fluid to keep your pee (urine) pale yellow.  Ask your doctor, or the department that is doing the test: ? When will my results be ready? ? How will I get my results? Summary  A cardiac nuclear scan is a test that is done to check the flow of blood to your heart.  Tell your doctor whether you are pregnant or may be pregnant.  Before the test, ask your doctor about changing or stopping your normal medicines. This is important.  Ask your doctor whether you can return to your normal activities. You may be asked to drink more fluids. This information is not intended to  replace advice given to you by your health care provider. Make sure you discuss any questions you have with your health care provider. Document Revised: 10/16/2018 Document Reviewed: 12/10/2017 Elsevier Patient Education  [place].

## 2020-04-08 ENCOUNTER — Telehealth: Payer: Self-pay

## 2020-04-08 NOTE — Telephone Encounter (Signed)
Spoke with the patient, detailed instructions given. He stated that he understood and would be here for his test. Asked to call back with any questions. S.Asahel Risden EMTP 

## 2020-04-09 ENCOUNTER — Other Ambulatory Visit: Payer: Self-pay

## 2020-04-09 ENCOUNTER — Ambulatory Visit (INDEPENDENT_AMBULATORY_CARE_PROVIDER_SITE_OTHER): Payer: Commercial Managed Care - PPO

## 2020-04-09 DIAGNOSIS — R011 Cardiac murmur, unspecified: Secondary | ICD-10-CM

## 2020-04-09 DIAGNOSIS — Z01810 Encounter for preprocedural cardiovascular examination: Secondary | ICD-10-CM

## 2020-04-09 DIAGNOSIS — Z01818 Encounter for other preprocedural examination: Secondary | ICD-10-CM

## 2020-04-09 HISTORY — PX: HIP SURGERY: SHX245

## 2020-04-09 LAB — ECHOCARDIOGRAM COMPLETE
AR max vel: 1.41 cm2
AV Area VTI: 1.4 cm2
AV Area mean vel: 1.4 cm2
AV Mean grad: 11.5 mmHg
AV Peak grad: 20.6 mmHg
Ao pk vel: 2.27 m/s
Area-P 1/2: 4.49 cm2
Calc EF: 43.2 %
S' Lateral: 4.1 cm
Single Plane A2C EF: 41.3 %
Single Plane A4C EF: 47.3 %

## 2020-04-09 NOTE — Progress Notes (Signed)
Complete echocardiogram performed.  Jimmy Yevette Knust RDCS, RVT  

## 2020-04-15 ENCOUNTER — Telehealth: Payer: Self-pay | Admitting: Cardiology

## 2020-04-15 ENCOUNTER — Telehealth: Payer: Self-pay

## 2020-04-15 ENCOUNTER — Other Ambulatory Visit: Payer: Self-pay

## 2020-04-15 ENCOUNTER — Ambulatory Visit (INDEPENDENT_AMBULATORY_CARE_PROVIDER_SITE_OTHER): Payer: Commercial Managed Care - PPO

## 2020-04-15 DIAGNOSIS — Z01810 Encounter for preprocedural cardiovascular examination: Secondary | ICD-10-CM | POA: Diagnosis not present

## 2020-04-15 DIAGNOSIS — I77819 Aortic ectasia, unspecified site: Secondary | ICD-10-CM

## 2020-04-15 DIAGNOSIS — I10 Essential (primary) hypertension: Secondary | ICD-10-CM

## 2020-04-15 DIAGNOSIS — I2510 Atherosclerotic heart disease of native coronary artery without angina pectoris: Secondary | ICD-10-CM | POA: Diagnosis not present

## 2020-04-15 DIAGNOSIS — Z01818 Encounter for other preprocedural examination: Secondary | ICD-10-CM

## 2020-04-15 LAB — MYOCARDIAL PERFUSION IMAGING
LV dias vol: 102 mL (ref 62–150)
LV sys vol: 60 mL
Peak HR: 96 {beats}/min
Rest HR: 73 {beats}/min
SDS: 0
SRS: 3
SSS: 3
TID: 1.08

## 2020-04-15 MED ORDER — TECHNETIUM TC 99M TETROFOSMIN IV KIT
32.1000 | PACK | Freq: Once | INTRAVENOUS | Status: AC | PRN
  Administered 2020-04-15: 32.1 via INTRAVENOUS

## 2020-04-15 MED ORDER — REGADENOSON 0.4 MG/5ML IV SOLN
0.4000 mg | Freq: Once | INTRAVENOUS | Status: AC
  Administered 2020-04-15: 0.4 mg via INTRAVENOUS

## 2020-04-15 MED ORDER — TECHNETIUM TC 99M TETROFOSMIN IV KIT
10.9000 | PACK | Freq: Once | INTRAVENOUS | Status: AC | PRN
  Administered 2020-04-15: 10.9 via INTRAVENOUS

## 2020-04-15 NOTE — Progress Notes (Signed)
ches CTA

## 2020-04-15 NOTE — Telephone Encounter (Signed)
Spoke with patient regarding results and recommendation.  Patient verbalizes understanding and is agreeable to plan of care. Advised patient to call back with any issues or concerns.   Patient states that he would like to speak to his wife in regards to the chest CTA before deciding to schedule it. He states that he will call back if they have any questions or other concerns.

## 2020-04-15 NOTE — Telephone Encounter (Signed)
-----   Message from Berniece Salines, DO sent at 04/14/2020  6:53 PM EDT ----- Normal ejection fraction, there is moderate dilatation of the ascending aorta. I will like to get a chest CTA to confirm the size.   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

## 2020-04-15 NOTE — Telephone Encounter (Signed)
Follow Up:    Pt is returning your call, concerning his results.

## 2020-04-15 NOTE — Telephone Encounter (Signed)
Left message on patients voicemail to please return our call.   

## 2020-04-16 ENCOUNTER — Telehealth: Payer: Self-pay

## 2020-04-16 NOTE — Telephone Encounter (Signed)
Left message on patients voicemail to please return our call.   

## 2020-04-16 NOTE — Telephone Encounter (Signed)
-----   Message from Berniece Salines, DO sent at 04/15/2020  7:07 PM EDT ----- Your stress test did not show any evidence of ischemia.  During the stress test her EF was reported as abnormal but thankfully we have a recent echocardiogram and that echo shows a normal heart function so there is no need for any further work-up in this regards.

## 2020-04-19 ENCOUNTER — Telehealth: Payer: Self-pay

## 2020-04-19 NOTE — Telephone Encounter (Signed)
-----   Message from Berniece Salines, DO sent at 04/15/2020  7:07 PM EDT ----- Your stress test did not show any evidence of ischemia.  During the stress test her EF was reported as abnormal but thankfully we have a recent echocardiogram and that echo shows a normal heart function so there is no need for any further work-up in this regards.

## 2020-04-19 NOTE — Telephone Encounter (Signed)
Spoke with patients wife regarding results and recommendation. ° °She verbalizes understanding and is agreeable to plan of care. Advised patient to call back with any issues or concerns.  °

## 2020-06-17 ENCOUNTER — Ambulatory Visit: Payer: Commercial Managed Care - PPO | Admitting: Neurology

## 2020-06-18 ENCOUNTER — Ambulatory Visit: Payer: Commercial Managed Care - PPO | Admitting: Neurology

## 2020-07-08 ENCOUNTER — Other Ambulatory Visit: Payer: Self-pay

## 2020-07-12 ENCOUNTER — Ambulatory Visit: Payer: Commercial Managed Care - PPO | Admitting: Cardiology

## 2020-07-12 ENCOUNTER — Encounter: Payer: Self-pay | Admitting: Cardiology

## 2020-07-12 ENCOUNTER — Other Ambulatory Visit: Payer: Self-pay

## 2020-07-12 VITALS — BP 120/80 | HR 88 | Ht 71.0 in | Wt 199.2 lb

## 2020-07-12 DIAGNOSIS — I119 Hypertensive heart disease without heart failure: Secondary | ICD-10-CM

## 2020-07-12 DIAGNOSIS — I2510 Atherosclerotic heart disease of native coronary artery without angina pectoris: Secondary | ICD-10-CM

## 2020-07-12 DIAGNOSIS — E782 Mixed hyperlipidemia: Secondary | ICD-10-CM | POA: Diagnosis not present

## 2020-07-12 DIAGNOSIS — R011 Cardiac murmur, unspecified: Secondary | ICD-10-CM | POA: Diagnosis not present

## 2020-07-12 NOTE — Progress Notes (Signed)
Cardiology Office Note:    Date:  07/12/2020   ID:  Brian Sullivan, DOB 03-01-1946, MRN [id number]  PCP:  Nicoletta Dress, MD  Cardiologist:  Berniece Salines, DO  Electrophysiologist:  None   Referring MD: Nicoletta Dress, MD   " I am doing well"  History of Present Illness:    Brian Sullivan is a 75 y.o. male with a hx of .  Coronary artery disease, hypertension, hyperlipidemia, is here today for follow-up visit.  I saw the patient in September 2021 at that time he was pending surgery therefore and a pharmacologic nuclear stress test was ordered.  Due to a systolic murmur and echocardiogram was also ordered.  The patient is here today for follow-up visit he appears to be doing well.  But he has had an unfortunate time over the last 9 weeks he tells me.  He fell and broke his hip and had to undergo hip replacement.  This has pushback his knee surgery.  But he is grateful and is doing well with rehab.  He also has had his cataract surgery as well.  No chest pain, no shortness of breath.   Past Medical History:  Diagnosis Date  . Anxiety 04/05/2020  . Balance problem 04/05/2020  . Benign hypertensive heart disease without heart failure 04/05/2020  . Benign non-nodular prostatic hyperplasia 04/05/2020  . CAD (coronary artery disease) 04/05/2020  . Cerebral palsy ([place])   . Gastro-esophageal reflux 04/05/2020  . High risk medication use 04/05/2020  . Hypercholesteremia   . Hyperlipemia 04/05/2020  . Hyperplasia of prostate with lower urinary tract symptoms (LUTS) 04/05/2020  . Hypertension   . 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 IBS (irritable bowel syndrome) 04/05/2020  . Irritable bladder 04/05/2020  . Migraines   . Plantar fasciitis, left 09/06/2015  . Prostatitis 04/05/2020  . 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 Rupture of distal biceps tendon 04/05/2020  . Skin cancer 04/05/2020  . Vitamin D deficiency 04/05/2020    Past Surgical History:  Procedure Laterality Date  . CHOLECYSTECTOMY    .  KNEE SURGERY Right 04/25/2019   1.Partial medial meniscectomy 2.Chondroplasty medical femoral  . TENDON REPAIR Right 06/09/2011   Open right distal biceps tendon repair    Current Medications: Current Meds  Medication Sig  . amitriptyline (ELAVIL) 25 MG tablet Take 25 mg by mouth daily.   Marland Kitchen aspirin EC 81 MG tablet Take 81 mg by mouth as needed.   . diclofenac (VOLTAREN) 75 MG EC tablet Take 75 mg by mouth 2 (two) times daily.  Marland Kitchen esomeprazole (NEXIUM) 40 MG capsule Take 40 mg by mouth 2 (two) times daily before a meal.   . levothyroxine (SYNTHROID) 50 MCG tablet Take 50 mcg by mouth daily.  . montelukast (SINGULAIR) 10 MG tablet Take 10 mg by mouth at bedtime.  . propranolol (INDERAL) 10 MG tablet TAKE 1 TO 2 TABLETS ONCE DAILY AS NEEDED FOR TENSION HEADACHE.  . tadalafil (CIALIS) 5 MG tablet Take 5 mg by mouth daily as needed for erectile dysfunction.  . tamsulosin (FLOMAX) 0.4 MG CAPS capsule Take 0.4 mg by mouth daily.  . Vitamin D, Ergocalciferol, (DRISDOL) 1.25 MG (50000 UT) CAPS capsule TAKE 1 CAPSULE ONCE A WEEK     Allergies:   Gemfibrozil, Paroxetine hcl, Statins, and Sulfamethoxazole   Social History   Socioeconomic History  . Marital status: Married    Spouse name: [name]  . Number of children: 1  . Years of education: Not on file  .  Highest education level: Some college, no degree  Occupational History  . Occupation: retired  Tobacco Use  . Smoking status: Former Smoker    Quit date: 04/05/1970    Years since quitting: 50.3  . Smokeless tobacco: Never Used  Vaping Use  . Vaping Use: Never used  Substance and Sexual Activity  . Alcohol use: Yes    Alcohol/week: 0.0 standard drinks    Comment: occas.  . Drug use: No  . Sexual activity: Not on file  Other Topics Concern  . Not on file  Social History Narrative   Patient is right-handed. He lives with his wife in a 1 story house. He drinks 1 cup of coffee day, he does not exercise. Volunteers at hospital.     Social Determinants of Health   Financial Resource Strain: Not on file  Food Insecurity: Not on file  Transportation Needs: Not on file  Physical Activity: Not on file  Stress: Not on file  Social Connections: Not on file     Family History: The patient's family history includes CAD in his father and mother; COPD in his mother; Emphysema in his mother; Headache in his mother; Heart attack in his father; Hypertension in his father and mother.  ROS:   Review of Systems  Constitution: Negative for decreased appetite, fever and weight gain.  HENT: Negative for congestion, ear discharge, hoarse voice and sore throat.   Eyes: Negative for discharge, redness, vision loss in right eye and visual halos.  Cardiovascular: Negative for chest pain, dyspnea on exertion, leg swelling, orthopnea and palpitations.  Respiratory: Negative for cough, hemoptysis, shortness of breath and snoring.   Endocrine: Negative for heat intolerance and polyphagia.  Hematologic/Lymphatic: Negative for bleeding problem. Does not bruise/bleed easily.  Skin: Negative for flushing, nail changes, rash and suspicious lesions.  Musculoskeletal: Negative for arthritis, joint pain, muscle cramps, myalgias, neck pain and stiffness.  Gastrointestinal: Negative for abdominal pain, bowel incontinence, diarrhea and excessive appetite.  Genitourinary: Negative for decreased libido, genital sores and incomplete emptying.  Neurological: Negative for brief paralysis, focal weakness, headaches and loss of balance.  Psychiatric/Behavioral: Negative for altered mental status, depression and suicidal ideas.  Allergic/Immunologic: Negative for HIV exposure and persistent infections.    EKGs/Labs/Other Studies Reviewed:    The following studies were reviewed today:   EKG: None today  Pharmacologic nuclear stress test.  The left ventricular ejection fraction is moderately decreased (30-44%).  Nuclear stress EF: 41%.  There  was no ST segment deviation noted during stress.  Defect 1: There is a medium defect, in lateral segment of LV. Artifactual  No evidence of ischemia   Transthoracic echocardiogram IMPRESSIONS  1. Left ventricular ejection fraction, by estimation, is 55 to 60%. The  left ventricle has normal function. The left ventricle has no regional  wall motion abnormalities. Left ventricular diastolic parameters are  consistent with Grade I diastolic  dysfunction (impaired relaxation).  2. Right ventricular systolic function is normal. The right ventricular  size is normal. There is normal pulmonary artery systolic pressure.  3. The mitral valve is degenerative. Trivial mitral valve regurgitation.  No evidence of mitral stenosis. Moderate mitral annular calcification.  4. The aortic valve has an indeterminant number of cusps. Aortic valve  regurgitation is not visualized. Mild to moderate aortic valve  sclerosis/calcification is present, without any evidence of aortic  stenosis.  5. There is moderate dilatation of the ascending aorta, measuring 42 mm.  6. The inferior vena cava is normal in size  with greater than 50%  respiratory variability, suggesting right atrial pressure of 3 mmHg.   Recent Labs: No results found for requested labs within last 8760 hours.  Recent Lipid Panel No results found for: CHOL, TRIG, HDL, CHOLHDL, VLDL, LDLCALC, LDLDIRECT  Physical Exam:    VS:  BP 120/80   Pulse 88   Ht 5\' 11"  (1.803 m)   Wt 199 lb 3.2 oz (90.4 kg)   SpO2 97%   BMI 27.78 kg/m     Wt Readings from Last 3 Encounters:  07/12/20 199 lb 3.2 oz (90.4 kg)  04/15/20 199 lb (90.3 kg)  04/07/20 199 lb 6.4 oz (90.4 kg)     GEN: Well nourished, well developed in no acute distress HEENT: Normal NECK: No JVD; No carotid bruits LYMPHATICS: No lymphadenopathy CARDIAC: S1S2 noted,RRR, 4/6 midsystolic murmurs, rubs, gallops RESPIRATORY:  Clear to auscultation without rales, wheezing or rhonchi   ABDOMEN: Soft, non-tender, non-distended, +bowel sounds, no guarding. EXTREMITIES: No edema, No cyanosis, no clubbing MUSCULOSKELETAL:  No deformity  SKIN: Warm and dry NEUROLOGIC:  Alert and oriented x 3, non-focal PSYCHIATRIC:  Normal affect, good insight  ASSESSMENT:    1. Benign hypertensive heart disease without heart failure   2. Coronary artery disease involving native coronary artery of native heart without angina pectoris   3. Murmur, cardiac   4. Mixed hyperlipidemia    PLAN:     1.  We discussed his testing results.  All his questions has been answered from his echocardiogram as well as his nuclear stress test.  Explained to the patient what it means to have dilatation of his aorta.  We will plan to repeat imaging in 1 year.  2.  His blood pressure is acceptable in the office no changes will be made.  3.  He is recovering from his hip surgery and is looking forward to once recovered to be able to undergo his knee surgery.  4.  No angina symptoms.  We will continue to monitor.  Recent stress test with no ischemia.  The patient is in agreement with the above plan. The patient left the office in stable condition.  The patient will follow up in 1 year or sooner if needed.   Medication Adjustments/Labs and Tests Ordered: Current medicines are reviewed at length with the patient today.  Concerns regarding medicines are outlined above.  No orders of the defined types were placed in this encounter.  No orders of the defined types were placed in this encounter.   Patient Instructions  Medication Instructions:  Your physician recommends that you continue on your current medications as directed. Please refer to the Current Medication list given to you today.  *If you need a refill on your cardiac medications before your next appointment, please call your pharmacy*   Lab Work: NONE If you have labs (blood work) drawn today and your tests are completely normal, you will  receive your results only by: Marland Kitchen MyChart Message (if you have MyChart) OR . A paper copy in the mail If you have any lab test that is abnormal or we need to change your treatment, we will call you to review the results.   Testing/Procedures: NONE   Follow-Up: At [facility], you and your health needs are our priority.  As part of our continuing mission to provide you with exceptional heart care, we have created designated Provider Care Teams.  These Care Teams include your primary Cardiologist (physician) and Advanced Practice Providers (APPs -  Physician Assistants and Nurse Practitioners) who all work together to provide you with the care you need, when you need it.  We recommend signing up for the patient portal called "MyChart".  Sign up information is provided on this After Visit Summary.  MyChart is used to connect with patients for Virtual Visits (Telemedicine).  Patients are able to view lab/test results, encounter notes, upcoming appointments, etc.  Non-urgent messages can be sent to your provider as well.   To learn more about what you can do with MyChart, go to [email or web address].    Your next appointment:   1 year(s)  The format for your next appointment:   In Person  Provider:   Thomasene Ripple, DO   Other Instructions      Adopting a Healthy Lifestyle.  Know what a healthy weight is for you (roughly BMI <25) and aim to maintain this   Aim for 7+ servings of fruits and vegetables daily   65-80+ fluid ounces of water or unsweet tea for healthy kidneys   Limit to max 1 drink of alcohol per day; avoid smoking/tobacco   Limit animal fats in diet for cholesterol and heart health - choose grass fed whenever available   Avoid highly processed foods, and foods high in saturated/trans fats   Aim for low stress - take time to unwind and care for your mental health   Aim for 150 min of moderate intensity exercise weekly for heart health, and weights twice weekly  for bone health   Aim for 7-9 hours of sleep daily   When it comes to diets, agreement about the perfect plan isnt easy to find, even among the experts. Experts at the [facility] of [employer] developed an idea known as the Healthy Eating Plate. Just imagine a plate divided into logical, healthy portions.   The emphasis is on diet quality:   Load up on vegetables and fruits - one-half of your plate: Aim for color and variety, and remember that potatoes dont count.   Go for whole grains - one-quarter of your plate: Whole wheat, barley, wheat berries, quinoa, oats, brown rice, and foods made with them. If you want pasta, go with whole wheat pasta.   Protein power - one-quarter of your plate: Fish, chicken, beans, and nuts are all healthy, versatile protein sources. Limit red meat.   The diet, however, does go beyond the plate, offering a few other suggestions.   Use healthy plant oils, such as olive, canola, soy, corn, sunflower and peanut. Check the labels, and avoid partially hydrogenated oil, which have unhealthy trans fats.   If youre thirsty, drink water. Coffee and tea are good in moderation, but skip sugary drinks and limit milk and dairy products to one or two daily servings.   The type of carbohydrate in the diet is more important than the amount. Some sources of carbohydrates, such as vegetables, fruits, whole grains, and beans-are healthier than others.   Finally, stay active  Signed, Thomasene Ripple, DO  07/12/2020 11:33 AM     [facility]

## 2020-07-12 NOTE — Patient Instructions (Signed)

## 2020-07-21 ENCOUNTER — Telehealth: Payer: Self-pay | Admitting: Cardiology

## 2020-07-21 NOTE — Telephone Encounter (Signed)
Patient's wife calling to pay a bill that she knows that they owe. She states her insurance just made the EOB available to her last week and she is ready to pay it. She says that she has come to the office to pay but we show that he does not owe a balance. She said she just needs [place] to know that he does owe money and she is willing to pay it now. Please advise.

## 2020-11-04 ENCOUNTER — Encounter: Payer: Self-pay | Admitting: Gastroenterology

## 2020-11-30 ENCOUNTER — Encounter: Payer: Self-pay | Admitting: Gastroenterology

## 2020-11-30 ENCOUNTER — Ambulatory Visit (INDEPENDENT_AMBULATORY_CARE_PROVIDER_SITE_OTHER): Payer: Medicare Other | Admitting: Gastroenterology

## 2020-11-30 ENCOUNTER — Other Ambulatory Visit: Payer: Self-pay

## 2020-11-30 VITALS — BP 142/82 | HR 79 | Ht 71.0 in | Wt 197.4 lb

## 2020-11-30 DIAGNOSIS — R1310 Dysphagia, unspecified: Secondary | ICD-10-CM

## 2020-11-30 DIAGNOSIS — K219 Gastro-esophageal reflux disease without esophagitis: Secondary | ICD-10-CM | POA: Diagnosis not present

## 2020-11-30 DIAGNOSIS — K582 Mixed irritable bowel syndrome: Secondary | ICD-10-CM

## 2020-11-30 MED ORDER — NA SULFATE-K SULFATE-MG SULF 17.5-3.13-1.6 GM/177ML PO SOLN: 1.0000 | Freq: Once | ORAL | 0 refills | Status: DC

## 2020-11-30 NOTE — Progress Notes (Signed)
Chief Complaint: GI eval  Referring Provider:  Nicoletta Dress, MD      ASSESSMENT AND PLAN;   #1. GERD with dysphagia.  #2. IBS with diarrhea and constipation on amitriptyline.  Due for screening colon.   Plan: -EGD with dil/colon. -Nexium 40 mg p.o. BID to continue. -Chew foods especially meats and breads well and eat slowly.  Take all pills in sitting or standing position.    I discussed the nature of the recommended EGD/Colonoscopy , as well as the indications, risks, alternatives and potential complications including, but not limited to, bleeding, infection, reaction to medication, damage to internal organs, cardiac and/or pulmonary problems, and perforation requiring surgery (1 to 2 in 1000). The possibility that significant findings could be missed was explained. All ? were answered. The patient gives consent for the procedures.     HPI:    Brian Sullivan is a 75 y.o. male   Dysphagia x 2 to 3 months, mostly with solids, food especially breads getting hung up in the lower neck/upper chest.  No odynophagia.  At times he has to wash it with liquids.  No significant heartburn as long as he takes Nexium 40 mg p.o. once a day.  No recent weight loss.  No melena or hematochezia.  He has history of IBS with alternating diarrhea and constipation.  Some lower abdominal discomfort which gets better with defecation.  #L hip Oct 2021 after fall, requiring left hip replacement.  Underwent extensive rehab.  Doing better.  He would eventually need left knee replacement as well.  No nonsteroidals.  Past GI procedures EGD 06/2012: Presbyesophagus s/p esophageal dilatation, small HH, mild gastritis.  Esophageal biopsies negative for eosinophilic esophagitis.  CLOtest was neg. Colonoscopy 12/2009 ([facility]) normal to TI.  Repeat in 10 years. S/P lap chole d/t biliary dyskinesia [facility] [place]  SH-married to [name] Past Medical History:  Diagnosis Date  . Anxiety 04/05/2020  .  Balance problem 04/05/2020  . Benign hypertensive heart disease without heart failure 04/05/2020  . Benign non-nodular prostatic hyperplasia 04/05/2020  . CAD (coronary artery disease) s/p RCA stent 2000 04/05/2020  . Cerebral palsy ([place])   . Esophageal dysphagia   . Gastro-esophageal reflux 04/05/2020  . H. pylori infection   . High risk medication use 04/05/2020  . Hypercholesteremia   . Hyperlipemia 04/05/2020  . Hyperplasia of prostate with lower urinary tract symptoms (LUTS) 04/05/2020  . Hypertension   . 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 IBS (irritable bowel syndrome) 04/05/2020  . Irritable bladder 04/05/2020  . Migraines   . Plantar fasciitis, left 09/06/2015  . Prostatitis 04/05/2020  . 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 Rupture of distal biceps tendon 04/05/2020  . Skin cancer 04/05/2020  . Vitamin D deficiency 04/05/2020    Past Surgical History:  Procedure Laterality Date  . CHOLECYSTECTOMY    . COLONOSCOPY  12/15/2009   Normal colonoscopy to terminal ileum  . ESOPHAGOGASTRODUODENOSCOPY  06/27/2012   Presbyesophagus status post esophageal dilatation. Small hiatal hernia. Mild gastritis  . HIP SURGERY Left 04/2020  . KNEE SURGERY Right 04/25/2019   1.Partial medial meniscectomy 2.Chondroplasty medical femoral  . LAPAROSCOPIC CHOLECYSTECTOMY  02/19/2004   [facility]  06/2008  . TENDON REPAIR Right 06/09/2011   Open right distal biceps tendon repair    Family History  Problem Relation Age of Onset  . COPD Mother   . Emphysema Mother   . CAD Mother   .  Hypertension Mother   . Headache Mother   . Heart attack Father   . CAD Father   . Hypertension Father   . Colon cancer Neg Hx   . Pancreatic cancer Neg Hx   . Esophageal cancer Neg Hx   . Stomach cancer Neg Hx     Social History   Tobacco Use  . Smoking status: Former Smoker    Quit date: 04/05/1970    Years since quitting: 50.6  . Smokeless tobacco: Never Used  Vaping Use  .  Vaping Use: Never used  Substance Use Topics  . Alcohol use: Yes    Alcohol/week: 0.0 standard drinks    Comment: occas.  . Drug use: No    Current Outpatient Medications  Medication Sig Dispense Refill  . amitriptyline (ELAVIL) 25 MG tablet Take 25 mg by mouth daily.     Marland Kitchen aspirin EC 81 MG tablet Take 81 mg by mouth as needed.     . diclofenac (VOLTAREN) 75 MG EC tablet Take 75 mg by mouth 2 (two) times daily as needed.    Marland Kitchen esomeprazole (NEXIUM) 40 MG capsule Take 40 mg by mouth 2 (two) times daily before a meal.     . levothyroxine (SYNTHROID) 50 MCG tablet Take 50 mcg by mouth daily.    . montelukast (SINGULAIR) 10 MG tablet Take 10 mg by mouth at bedtime.    . propranolol (INDERAL) 10 MG tablet TAKE 1 TO 2 TABLETS ONCE DAILY AS NEEDED FOR TENSION HEADACHE. (Patient taking differently: Take 10 mg by mouth 2 (two) times daily. TAKE 1 BID AS NEEDED FOR T MIGRAINE) 60 tablet 3  . tamsulosin (FLOMAX) 0.4 MG CAPS capsule Take 0.4 mg by mouth daily.  3  . Vitamin D, Ergocalciferol, (DRISDOL) 1.25 MG (50000 UT) CAPS capsule TAKE 1 CAPSULE ONCE A WEEK     No current facility-administered medications for this visit.    Allergies  Allergen Reactions  . Gemfibrozil Other (See Comments)  . Paroxetine Hcl Itching  . Statins Other (See Comments)  . Sulfamethoxazole Other (See Comments)    Review of Systems:  Constitutional: Denies fever, chills, diaphoresis, appetite change and fatigue.  HEENT: Denies photophobia, eye pain, redness, hearing loss, ear pain, congestion, sore throat, rhinorrhea, sneezing, mouth sores, neck pain, neck stiffness and tinnitus.   Respiratory: Denies SOB, DOE, cough, chest tightness,  and wheezing.   Cardiovascular: Denies chest pain, palpitations and leg swelling.  Genitourinary: Denies dysuria, urgency, frequency, hematuria, flank pain and difficulty urinating.  Musculoskeletal: Denies myalgias, back pain, has joint swelling, arthralgias and gait problem.   Skin: No rash.  Neurological: Denies dizziness, seizures, syncope, weakness, light-headedness, numbness and headaches.  Hematological: Denies adenopathy. Easy bruising, personal or family bleeding history  Psychiatric/Behavioral: Has anxiety, no depression     Physical Exam:    BP (!) 142/82   Pulse 79   Ht 5\' 11"  (1.803 m)   Wt 197 lb 6 oz (89.5 kg)   SpO2 98%   BMI 27.53 kg/m  Wt Readings from Last 3 Encounters:  11/30/20 197 lb 6 oz (89.5 kg)  07/12/20 199 lb 3.2 oz (90.4 kg)  04/15/20 199 lb (90.3 kg)   Constitutional:  Well-developed, in no acute distress. Psychiatric: Normal mood and affect. Behavior is normal. HEENT: Pupils normal.  Conjunctivae are normal. No scleral icterus. Neck supple.  Cardiovascular: Normal rate, regular rhythm. No edema Pulmonary/chest: Effort normal and breath sounds normal. No wheezing, rales or rhonchi. Abdominal: Soft, nondistended. Nontender.  Bowel sounds active throughout. There are no masses palpable. No hepatomegaly. Rectal: Deferred Neurological: Alert and oriented to person place and time. Skin: Skin is warm and dry. No rashes noted.   Carmell Austria, MD 11/30/2020, 9:36 AM  Cc: Nicoletta Dress, MD

## 2020-11-30 NOTE — Patient Instructions (Addendum)
If you are age 77 or older, your body mass index should be between 23-30. Your Body mass index is 27.53 kg/m. If this is out of the aforementioned range listed, please consider follow up with your Primary Care Provider.  If you are age 6 or younger, your body mass index should be between 19-25. Your Body mass index is 27.53 kg/m. If this is out of the aformentioned range listed, please consider follow up with your Primary Care Provider.   You have been scheduled for an endoscopy and colonoscopy. Please follow the written instructions given to you at your visit today. Please pick up your prep supplies at the pharmacy within the next 1-3 days. If you use inhalers (even only as needed), please bring them with you on the day of your procedure.  Continue Nexium daily.  We have given you samples of the following medication to take: Clenpiq  Please call with any questions or concerns.  Thank you,  Dr. Jackquline Denmark

## 2021-01-20 ENCOUNTER — Ambulatory Visit (AMBULATORY_SURGERY_CENTER): Payer: Medicare Other | Admitting: Gastroenterology

## 2021-01-20 ENCOUNTER — Other Ambulatory Visit: Payer: Self-pay

## 2021-01-20 ENCOUNTER — Encounter: Payer: Self-pay | Admitting: Gastroenterology

## 2021-01-20 VITALS — BP 118/71 | HR 64 | Temp 95.3°F | Resp 15 | Ht 71.0 in | Wt 197.0 lb

## 2021-01-20 DIAGNOSIS — K449 Diaphragmatic hernia without obstruction or gangrene: Secondary | ICD-10-CM | POA: Diagnosis not present

## 2021-01-20 DIAGNOSIS — K2970 Gastritis, unspecified, without bleeding: Secondary | ICD-10-CM

## 2021-01-20 DIAGNOSIS — K219 Gastro-esophageal reflux disease without esophagitis: Secondary | ICD-10-CM

## 2021-01-20 DIAGNOSIS — R1310 Dysphagia, unspecified: Secondary | ICD-10-CM

## 2021-01-20 DIAGNOSIS — K319 Disease of stomach and duodenum, unspecified: Secondary | ICD-10-CM

## 2021-01-20 DIAGNOSIS — K582 Mixed irritable bowel syndrome: Secondary | ICD-10-CM

## 2021-01-20 DIAGNOSIS — D120 Benign neoplasm of cecum: Secondary | ICD-10-CM

## 2021-01-20 DIAGNOSIS — Z1211 Encounter for screening for malignant neoplasm of colon: Secondary | ICD-10-CM

## 2021-01-20 DIAGNOSIS — D123 Benign neoplasm of transverse colon: Secondary | ICD-10-CM

## 2021-01-20 MED ORDER — SODIUM CHLORIDE 0.9 % IV SOLN: 500.0000 mL | Freq: Once | INTRAVENOUS | Status: DC

## 2021-01-20 NOTE — Patient Instructions (Signed)
YOU HAD AN ENDOSCOPIC PROCEDURE TODAY AT THE [facility]:   Refer to the procedure report that was given to you for any specific questions about what was found during the examination.  If the procedure report does not answer your questions, please call your gastroenterologist to clarify.  If you requested that your care partner not be given the details of your procedure findings, then the procedure report has been included in a sealed envelope for you to review at your convenience later.  YOU SHOULD EXPECT: Some feelings of bloating in the abdomen. Passage of more gas than usual.  Walking can help get rid of the air that was put into your GI tract during the procedure and reduce the bloating. If you had a lower endoscopy (such as a colonoscopy or flexible sigmoidoscopy) you may notice spotting of blood in your stool or on the toilet paper. If you underwent a bowel prep for your procedure, you may not have a normal bowel movement for a few days.  Please Note:  You might notice some irritation and congestion in your nose or some drainage.  This is from the oxygen used during your procedure.  There is no need for concern and it should clear up in a day or so.  SYMPTOMS TO REPORT IMMEDIATELY:   Following lower endoscopy (colonoscopy or flexible sigmoidoscopy):  Excessive amounts of blood in the stool  Significant tenderness or worsening of abdominal pains  Swelling of the abdomen that is new, acute  Fever of 100F or higher   Following upper endoscopy (EGD)  Vomiting of blood or coffee ground material  New chest pain or pain under the shoulder blades  Painful or persistently difficult swallowing  New shortness of breath  Fever of 100F or higher  Black, tarry-looking stools  For urgent or emergent issues, a gastroenterologist can be reached at any hour by calling [phone]. Do not use MyChart messaging for urgent concerns.    DIET:  We do recommend a small meal at first, but  then you may proceed to your regular diet.  Drink plenty of fluids but you should avoid alcoholic beverages for 24 hours.  ACTIVITY:  You should plan to take it easy for the rest of today and you should NOT DRIVE or use heavy machinery until tomorrow (because of the sedation medicines used during the test).    FOLLOW UP: Our staff will call the number listed on your records 48-72 hours following your procedure to check on you and address any questions or concerns that you may have regarding the information given to you following your procedure. If we do not reach you, we will leave a message.  We will attempt to reach you two times.  During this call, we will ask if you have developed any symptoms of COVID 19. If you develop any symptoms (ie: fever, flu-like symptoms, shortness of breath, cough etc.) before then, please call [phone].  If you test positive for Covid 19 in the 2 weeks post procedure, please call and report this information to us.    If any biopsies were taken you will be contacted by phone or by letter within the next 1-3 weeks.  Please call us at [phone] if you have not heard about the biopsies in 3 weeks.    SIGNATURES/CONFIDENTIALITY: You and/or your care partner have signed paperwork which will be entered into your electronic medical record.  These signatures attest to the fact that that the information above on   your After Visit Summary has been reviewed and is understood.  Full responsibility of the confidentiality of this discharge information lies with you and/or your care-partner. 

## 2021-01-20 NOTE — Progress Notes (Signed)
Called to room to assist during endoscopic procedure.  Patient ID and intended procedure confirmed with present staff. Received instructions for my participation in the procedure from the performing physician.  

## 2021-01-20 NOTE — Progress Notes (Signed)
pt tolerated well. VSS. awake and to recovery. Report given to RN.  

## 2021-01-20 NOTE — Op Note (Signed)
[facility] Patient Name: Brian Sullivan Procedure Date: 01/20/2021 1:58 PM MRN: [id number] Endoscopist: Jackquline Denmark , MD Age: 75 Referring MD:  Date of Birth: 1946/01/13 Gender: Male Account #: [id number] Procedure:                Upper GI endoscopy Indications:              Dysphagia Medicines:                Monitored Anesthesia Care Procedure:                Pre-Anesthesia Assessment:                           - Prior to the procedure, a History and Physical                            was performed, and patient medications and                            allergies were reviewed. The patient's tolerance of                            previous anesthesia was also reviewed. The risks                            and benefits of the procedure and the sedation                            options and risks were discussed with the patient.                            All questions were answered, and informed consent                            was obtained. Prior Anticoagulants: The patient has                            taken no previous anticoagulant or antiplatelet                            agents. ASA Grade Assessment: II - A patient with                            mild systemic disease. After reviewing the risks                            and benefits, the patient was deemed in                            satisfactory condition to undergo the procedure.                           After obtaining informed consent, the endoscope was  passed under direct vision. Throughout the                            procedure, the patient's blood pressure, pulse, and                            oxygen saturations were monitored continuously. The                            Endoscope was introduced through the mouth, and                            advanced to the second part of duodenum. The upper                            GI endoscopy was accomplished without difficulty.                             The patient tolerated the procedure well. Scope In: Scope Out: Findings:                 The examined esophagus was mildly tortuous. Z-line                            well-defined at 38 cm, examined by NBI. The scope                            was withdrawn. Dilation was performed with a                            Maloney dilator with mild resistance at 59 Fr, 52                            Fr and 64 Fr. esophageal biopsies were not                            performed as previous biopsies were negative for                            eosinophilic esophagitis.                           A small hiatal hernia was present.                           Diffuse minimal inflammation characterized by                            erythema was found in the gastric body. Biopsies                            were taken with a cold forceps for histology.  The examined duodenum was normal. Complications:            No immediate complications. Estimated Blood Loss:     Estimated blood loss: none. Impression:               - Presbyesophagus s/p esophageal dilatation.                           - Small hiatal hernia.                           - Minimal gastritis. Recommendation:           - Patient has a contact number available for                            emergencies. The signs and symptoms of potential                            delayed complications were discussed with the                            patient. Return to normal activities tomorrow.                            Written discharge instructions were provided to the                            patient.                           - Post dilatation diet.                           - Continue present medications.                           - Await pathology results.                           - The findings and recommendations were discussed                            with family. Jackquline Denmark,  MD 01/20/2021 2:39:26 PM This report has been signed electronically.

## 2021-01-20 NOTE — Op Note (Signed)
[facility] Patient Name: Brian Sullivan Procedure Date: 01/20/2021 1:52 PM MRN: [id number] Endoscopist: Jackquline Denmark , MD Age: 75 Referring MD:  Date of Birth: Oct 12, 1945 Gender: Male Account #: [id number] Procedure:                Colonoscopy Indications:              Screening for colorectal malignant neoplasm Medicines:                Monitored Anesthesia Care Procedure:                Pre-Anesthesia Assessment:                           - Prior to the procedure, a History and Physical                            was performed, and patient medications and                            allergies were reviewed. The patient's tolerance of                            previous anesthesia was also reviewed. The risks                            and benefits of the procedure and the sedation                            options and risks were discussed with the patient.                            All questions were answered, and informed consent                            was obtained. Prior Anticoagulants: The patient has                            taken no previous anticoagulant or antiplatelet                            agents. ASA Grade Assessment: II - A patient with                            mild systemic disease. After reviewing the risks                            and benefits, the patient was deemed in                            satisfactory condition to undergo the procedure.                           After obtaining informed consent, the colonoscope  was passed under direct vision. Throughout the                            procedure, the patient's blood pressure, pulse, and                            oxygen saturations were monitored continuously. The                            CF [id number] #[id number] was introduced through the anus                            and advanced to the 2 cm into the ileum. The                            colonoscopy was performed  without difficulty. The                            patient tolerated the procedure well. The quality                            of the bowel preparation was good. The terminal                            ileum, ileocecal valve, appendiceal orifice, and                            rectum were photographed. Scope In: 2:22:10 PM Scope Out: 2:34:53 PM Scope Withdrawal Time: 0 hours 9 minutes 23 seconds  Total Procedure Duration: 0 hours 12 minutes 43 seconds  Findings:                 Three sessile polyps were found in the proximal                            transverse colon and cecum. The polyps were 2 to 6                            mm in size. These polyps were removed with a cold                            snare. Resection and retrieval were complete.                           A few small-mouthed diverticula were found in the                            sigmoid colon.                           Non-bleeding internal hemorrhoids were found during                            retroflexion. The hemorrhoids were small.  The terminal ileum appeared normal.                           The exam was otherwise without abnormality on                            direct and retroflexion views. Complications:            No immediate complications. Estimated Blood Loss:     Estimated blood loss: none. Impression:               - Three 2 to 6 mm polyps in the proximal transverse                            colon and in the cecum, removed with a cold snare.                            Resected and retrieved.                           - Mild sigmoid diverticulosis.                           - Non-bleeding internal hemorrhoids.                           - The examined portion of the ileum was normal.                           - The examination was otherwise normal on direct                            and retroflexion views. Recommendation:           - Patient has a contact number available  for                            emergencies. The signs and symptoms of potential                            delayed complications were discussed with the                            patient. Return to normal activities tomorrow.                            Written discharge instructions were provided to the                            patient.                           - Resume previous diet.                           - Continue present medications.                           -  Await pathology results.                           - Repeat colonoscopy is not recommended for                            surveillance.                           - Return to GI clinic PRN.                           - The findings and recommendations were discussed                            with the patient's family. Jackquline Denmark, MD 01/20/2021 2:42:39 PM This report has been signed electronically.

## 2021-01-24 ENCOUNTER — Telehealth: Payer: Self-pay | Admitting: *Deleted

## 2021-01-24 NOTE — Telephone Encounter (Signed)
Follow up phone call made/

## 2021-01-29 ENCOUNTER — Encounter: Payer: Self-pay | Admitting: Gastroenterology

## 2021-07-11 ENCOUNTER — Inpatient Hospital Stay (HOSPITAL_COMMUNITY)
Admission: EM | Admit: 2021-07-11 | Discharge: 2021-07-21 | DRG: 234 | Disposition: A | Discharge status: Home Health | Payer: Medicare Other | Attending: Cardiothoracic Surgery | Admitting: Cardiothoracic Surgery

## 2021-07-11 ENCOUNTER — Emergency Department (HOSPITAL_COMMUNITY): Payer: Medicare Other

## 2021-07-11 ENCOUNTER — Observation Stay (HOSPITAL_COMMUNITY): Payer: Medicare Other

## 2021-07-11 ENCOUNTER — Encounter (HOSPITAL_COMMUNITY): Payer: Self-pay | Admitting: Emergency Medicine

## 2021-07-11 DIAGNOSIS — Z8249 Family history of ischemic heart disease and other diseases of the circulatory system: Secondary | ICD-10-CM

## 2021-07-11 DIAGNOSIS — I480 Paroxysmal atrial fibrillation: Secondary | ICD-10-CM | POA: Diagnosis not present

## 2021-07-11 DIAGNOSIS — K589 Irritable bowel syndrome without diarrhea: Secondary | ICD-10-CM | POA: Diagnosis present

## 2021-07-11 DIAGNOSIS — I119 Hypertensive heart disease without heart failure: Secondary | ICD-10-CM | POA: Diagnosis present

## 2021-07-11 DIAGNOSIS — I493 Ventricular premature depolarization: Secondary | ICD-10-CM | POA: Diagnosis present

## 2021-07-11 DIAGNOSIS — Z951 Presence of aortocoronary bypass graft: Secondary | ICD-10-CM

## 2021-07-11 DIAGNOSIS — I10 Essential (primary) hypertension: Secondary | ICD-10-CM | POA: Diagnosis not present

## 2021-07-11 DIAGNOSIS — N400 Enlarged prostate without lower urinary tract symptoms: Secondary | ICD-10-CM | POA: Diagnosis present

## 2021-07-11 DIAGNOSIS — I2510 Atherosclerotic heart disease of native coronary artery without angina pectoris: Secondary | ICD-10-CM | POA: Diagnosis present

## 2021-07-11 DIAGNOSIS — I350 Nonrheumatic aortic (valve) stenosis: Secondary | ICD-10-CM | POA: Diagnosis present

## 2021-07-11 DIAGNOSIS — G809 Cerebral palsy, unspecified: Secondary | ICD-10-CM | POA: Diagnosis present

## 2021-07-11 DIAGNOSIS — K219 Gastro-esophageal reflux disease without esophagitis: Secondary | ICD-10-CM | POA: Diagnosis present

## 2021-07-11 DIAGNOSIS — Z825 Family history of asthma and other chronic lower respiratory diseases: Secondary | ICD-10-CM

## 2021-07-11 DIAGNOSIS — Z79899 Other long term (current) drug therapy: Secondary | ICD-10-CM

## 2021-07-11 DIAGNOSIS — I25110 Atherosclerotic heart disease of native coronary artery with unstable angina pectoris: Secondary | ICD-10-CM | POA: Diagnosis not present

## 2021-07-11 DIAGNOSIS — E785 Hyperlipidemia, unspecified: Secondary | ICD-10-CM | POA: Diagnosis not present

## 2021-07-11 DIAGNOSIS — Z96642 Presence of left artificial hip joint: Secondary | ICD-10-CM | POA: Diagnosis present

## 2021-07-11 DIAGNOSIS — Z20822 Contact with and (suspected) exposure to covid-19: Secondary | ICD-10-CM | POA: Diagnosis present

## 2021-07-11 DIAGNOSIS — E039 Hypothyroidism, unspecified: Secondary | ICD-10-CM | POA: Diagnosis present

## 2021-07-11 DIAGNOSIS — R079 Chest pain, unspecified: Secondary | ICD-10-CM | POA: Diagnosis present

## 2021-07-11 DIAGNOSIS — E7800 Pure hypercholesterolemia, unspecified: Secondary | ICD-10-CM | POA: Diagnosis present

## 2021-07-11 DIAGNOSIS — Z955 Presence of coronary angioplasty implant and graft: Secondary | ICD-10-CM

## 2021-07-11 DIAGNOSIS — I200 Unstable angina: Secondary | ICD-10-CM

## 2021-07-11 DIAGNOSIS — Z87891 Personal history of nicotine dependence: Secondary | ICD-10-CM

## 2021-07-11 DIAGNOSIS — R0789 Other chest pain: Secondary | ICD-10-CM | POA: Diagnosis not present

## 2021-07-11 DIAGNOSIS — Z85828 Personal history of other malignant neoplasm of skin: Secondary | ICD-10-CM

## 2021-07-11 DIAGNOSIS — Z79890 Hormone replacement therapy (postmenopausal): Secondary | ICD-10-CM

## 2021-07-11 DIAGNOSIS — E559 Vitamin D deficiency, unspecified: Secondary | ICD-10-CM | POA: Diagnosis present

## 2021-07-11 DIAGNOSIS — R109 Unspecified abdominal pain: Secondary | ICD-10-CM

## 2021-07-11 DIAGNOSIS — Z800 Family history of malignant neoplasm of digestive organs: Secondary | ICD-10-CM

## 2021-07-11 DIAGNOSIS — D62 Acute posthemorrhagic anemia: Secondary | ICD-10-CM | POA: Diagnosis not present

## 2021-07-11 LAB — TROPONIN I (HIGH SENSITIVITY)
Troponin I (High Sensitivity): 7 ng/L (ref <18)
Troponin I (High Sensitivity): 9 ng/L (ref <18)
Troponin I (High Sensitivity): 7 ng/L (ref <18)

## 2021-07-11 LAB — CBC
HCT: 46.9 % (ref 39.0–52.0)
Hemoglobin: 15.3 g/dL (ref 13.0–17.0)
MCH: 30.2 pg (ref 26.0–34.0)
MCHC: 32.6 g/dL (ref 30.0–36.0)
MCV: 92.7 fL (ref 80.0–100.0)
Platelets: 155 10*3/uL (ref 150–400)
RBC: 5.06 MIL/uL (ref 4.22–5.81)
RDW: 13.1 % (ref 11.5–15.5)
WBC: 8.7 10*3/uL (ref 4.0–10.5)
nRBC: 0 % (ref 0.0–0.2)

## 2021-07-11 LAB — BASIC METABOLIC PANEL
Anion gap: 9 (ref 5–15)
BUN: 13 mg/dL (ref 8–23)
CO2: 24 mmol/L (ref 22–32)
Calcium: 9 mg/dL (ref 8.9–10.3)
Chloride: 104 mmol/L (ref 98–111)
Creatinine, Ser: 1.17 mg/dL (ref 0.61–1.24)
GFR, Estimated: >60 mL/min (ref >60)
Glucose, Bld: 91 mg/dL (ref 70–99)
Potassium: 4.1 mmol/L (ref 3.5–5.1)
Sodium: 137 mmol/L (ref 135–145)

## 2021-07-11 MED ORDER — LOSARTAN POTASSIUM 50 MG PO TABS
50.0000 mg | ORAL_TABLET | Freq: Once | ORAL | Status: AC
  Administered 2021-07-11: 50 mg via ORAL
  Filled 2021-07-11: qty 1

## 2021-07-11 MED ORDER — PRAVASTATIN SODIUM 10 MG PO TABS
20.0000 mg | ORAL_TABLET | Freq: Every day | ORAL | Status: DC
  Administered 2021-07-12 – 2021-07-14 (×3): 20 mg via ORAL
  Filled 2021-07-11 (×3): qty 2

## 2021-07-11 MED ORDER — AMITRIPTYLINE HCL 50 MG PO TABS
25.0000 mg | ORAL_TABLET | Freq: Two times a day (BID) | ORAL | Status: DC
  Administered 2021-07-11 – 2021-07-13 (×4): 25 mg via ORAL
  Filled 2021-07-11 (×6): qty 1

## 2021-07-11 MED ORDER — LOSARTAN POTASSIUM 50 MG PO TABS: 50.0000 mg | ORAL_TABLET | Freq: Every day | ORAL | Status: DC

## 2021-07-11 MED ORDER — PANTOPRAZOLE SODIUM 40 MG PO TBEC
40.0000 mg | DELAYED_RELEASE_TABLET | ORAL | Status: AC
  Administered 2021-07-11: 40 mg via ORAL
  Filled 2021-07-11: qty 1

## 2021-07-11 MED ORDER — ALUM & MAG HYDROXIDE-SIMETH 200-200-20 MG/5ML PO SUSP
30.0000 mL | Freq: Once | ORAL | Status: AC
  Administered 2021-07-11: 30 mL via ORAL
  Filled 2021-07-11: qty 30

## 2021-07-11 MED ORDER — PROPRANOLOL HCL 10 MG PO TABS
10.0000 mg | ORAL_TABLET | Freq: Two times a day (BID) | ORAL | Status: DC
  Administered 2021-07-11: 10 mg via ORAL
  Filled 2021-07-11 (×2): qty 1

## 2021-07-11 MED ORDER — LOSARTAN POTASSIUM 50 MG PO TABS: 50.0000 mg | ORAL_TABLET | Freq: Once | ORAL | Status: DC

## 2021-07-11 MED ORDER — LEVOTHYROXINE SODIUM 50 MCG PO TABS
50.0000 ug | ORAL_TABLET | Freq: Every day | ORAL | Status: DC
  Administered 2021-07-12 – 2021-07-15 (×4): 50 ug via ORAL
  Filled 2021-07-11: qty 1
  Filled 2021-07-11: qty 2
  Filled 2021-07-11 (×2): qty 1

## 2021-07-11 MED ORDER — EZETIMIBE 10 MG PO TABS
10.0000 mg | ORAL_TABLET | Freq: Every day | ORAL | Status: DC
  Administered 2021-07-12 – 2021-07-14 (×3): 10 mg via ORAL
  Filled 2021-07-11 (×3): qty 1

## 2021-07-11 NOTE — ED Provider Notes (Signed)
[facility] EMERGENCY DEPARTMENT Provider Note   CSN: [id number] Arrival date & time: 07/11/21  1450     History  Chief Complaint  Patient presents with   Chest Pain    Brian Sullivan is a 76 y.o. male.  76 yo M with a cc chest pain. Occurred while he was walking at [employer].  Felt a pressure across his chest had some difficulty breathing with it.  Tried to sit down but did not improve his symptoms.  Called 911 had morphine with some improvement.  Since then he has been pain-free until about an hour ago.  Had recurrence of his symptoms while at rest waiting to come back to be seen in the ER.  He denies history of an MI but did have stents placed in the past.  At that time his chest discomfort was more of a shooting pain.  He had a stress test done about 14 months ago that was reassuring prior to having hip replaced.  The history is provided by the patient.  Chest Pain Pain radiates to:  Does not radiate Pain severity:  Moderate Onset quality:  Gradual Duration:  2 days Timing:  Constant Progression:  Worsening Chronicity:  New Relieved by:  Nothing Worsened by:  Nothing Ineffective treatments:  None tried Associated symptoms: shortness of breath   Associated symptoms: no abdominal pain, no fever, no headache, no palpitations and no vomiting       Home Medications Prior to Admission medications   Medication Sig Start Date End Date Taking? Authorizing Provider  amitriptyline (ELAVIL) 25 MG tablet Take 25 mg by mouth in the morning and at bedtime.   Yes [provider]  diclofenac (VOLTAREN) 75 MG EC tablet Take 75 mg by mouth 2 (two) times daily as needed for moderate pain. 12/25/18  Yes [provider]  esomeprazole (NEXIUM) 40 MG capsule Take 40 mg by mouth 2 (two) times daily before a meal.    Yes [provider]  levothyroxine (SYNTHROID) 50 MCG tablet Take 50 mcg by mouth daily. 02/03/19  Yes [provider]  losartan  (COZAAR) 50 MG tablet Take 50 mg by mouth daily.   Yes [provider]  pravastatin (PRAVACHOL) 20 MG tablet Take 20 mg by mouth daily. 06/15/21  Yes [provider]  propranolol (INDERAL) 10 MG tablet TAKE 1 TO 2 TABLETS ONCE DAILY AS NEEDED FOR TENSION HEADACHE. Patient taking differently: Take 10 mg by mouth 2 (two) times daily. 04/24/19  Yes Jaffe, Adam R, DO  Vitamin D, Ergocalciferol, (DRISDOL) 1.25 MG (50000 UT) CAPS capsule Take 50,000 Units by mouth every 7 (seven) days. 02/14/19   [provider]      Allergies    Gemfibrozil, Paroxetine hcl, Statins, and Sulfamethoxazole    Review of Systems   Review of Systems  Constitutional:  Negative for chills and fever.  HENT:  Negative for congestion and facial swelling.   Eyes:  Negative for discharge and visual disturbance.  Respiratory:  Positive for shortness of breath.   Cardiovascular:  Positive for chest pain. Negative for palpitations.  Gastrointestinal:  Negative for abdominal pain, diarrhea and vomiting.  Musculoskeletal:  Negative for arthralgias and myalgias.  Skin:  Negative for color change and rash.  Neurological:  Negative for tremors, syncope and headaches.  Psychiatric/Behavioral:  Negative for confusion and dysphoric mood.    Physical Exam Updated Vital Signs BP (!) 155/96    Pulse 91    Temp 99.8 F (37.7  C) (Oral)    Resp (!) 23    SpO2 93%  Physical Exam Vitals and nursing note reviewed.  Constitutional:      Appearance: He is well-developed.  HENT:     Head: Normocephalic and atraumatic.  Eyes:     Pupils: Pupils are equal, round, and reactive to light.  Neck:     Vascular: No JVD.  Cardiovascular:     Rate and Rhythm: Normal rate and regular rhythm.     Heart sounds: No murmur heard.   No friction rub. No gallop.  Pulmonary:     Effort: No respiratory distress.     Breath sounds: No wheezing.  Abdominal:     General: There is no distension.     Tenderness: There is no  abdominal tenderness. There is no guarding or rebound.  Musculoskeletal:        General: Normal range of motion.     Cervical back: Normal range of motion and neck supple.  Skin:    Coloration: Skin is not pale.     Findings: No rash.  Neurological:     Mental Status: He is alert and oriented to person, place, and time.  Psychiatric:        Behavior: Behavior normal.    ED Results / Procedures / Treatments   Labs (all labs ordered are listed, but only abnormal results are displayed) Labs Reviewed  BASIC METABOLIC PANEL  CBC  TSH  HEMOGLOBIN A1C  LIPID PANEL  TROPONIN I (HIGH SENSITIVITY)  TROPONIN I (HIGH SENSITIVITY)  TROPONIN I (HIGH SENSITIVITY)    EKG EKG Interpretation  Date/Time:  Monday July 11 2021 19:41:51 EST Ventricular Rate:  72 PR Interval:  160 QRS Duration: 94 QT Interval:  402 QTC Calculation: 440 R Axis:   -7 Text Interpretation: Normal sinus rhythm Minimal voltage criteria for LVH, may be normal variant ( R in aVL ) Borderline ECG When compared with ECG of 11-Jul-2021 15:02, PREVIOUS ECG IS PRESENT No significant change since last tracing Confirmed by Deno Etienne [phone]) on 07/11/2021 8:00:46 PM  Radiology DG Chest 2 View  Result Date: 07/11/2021 CLINICAL DATA:  Sudden onset chest pain radiating to back EXAM: CHEST - 2 VIEW COMPARISON:  04/18/2020 FINDINGS: The heart size and mediastinal contours are within normal limits. Both lungs are clear. The visualized skeletal structures are unremarkable. IMPRESSION: No active cardiopulmonary disease. Electronically Signed   By: Randa Ngo M.D.   On: 07/11/2021 15:23    Procedures .1-3 Lead EKG Interpretation Performed by: Deno Etienne, DO Authorized by: Deno Etienne, DO     Interpretation: non-specific     ECG rate:  80   ECG rate assessment: normal     Rhythm: sinus rhythm     Ectopy: PVCs     Conduction: normal      Medications Ordered in ED Medications  losartan (COZAAR) tablet 50 mg (has no  administration in time range)  pantoprazole (PROTONIX) EC tablet 40 mg (has no administration in time range)  alum & mag hydroxide-simeth (MAALOX/MYLANTA) 200-200-20 MG/5ML suspension 30 mL (30 mLs Oral Given 07/11/21 2027)    ED Course/ Medical Decision Making/ A&P                           Medical Decision Making  76 yo M with a significant past medical history of coronary artery disease with stents placed comes in with a chief complaint of chest discomfort that occurred while  he was exerting himself at a store.  Since then he has had recurrence of his pain.  Currently pain-free.  EKG without concerning ischemic findings.  2 troponins are negative.  With his history of heart disease I discussed the case with the cardiology fellow on-call, Dr. Renella Cunas.  Recommended obtaining a third troponin as he had that recurrence of pain.  Will come and evaluate the patient at bedside.  Cards evaled patient, recommending admission.  NPO at midnight for consideration of cath in the morning.   The patients results and plan were reviewed and discussed.   Any x-rays performed were independently reviewed by myself.   Differential diagnosis were considered with the presenting HPI.  Medications  losartan (COZAAR) tablet 50 mg (has no administration in time range)  pantoprazole (PROTONIX) EC tablet 40 mg (has no administration in time range)  alum & mag hydroxide-simeth (MAALOX/MYLANTA) 200-200-20 MG/5ML suspension 30 mL (30 mLs Oral Given 07/11/21 2027)    Vitals:   07/11/21 1640 07/11/21 1911 07/11/21 2030 07/11/21 2145  BP: (!) 144/86 (!) 148/89 (!) 168/85 (!) 155/96  Pulse: 73 65 (!) 48 91  Resp: 16 18 20  (!) 23  Temp:      TempSrc:      SpO2: 95% 96% 97% 93%    Final diagnoses:  Chest pain with high risk for cardiac etiology    Admission/ observation were discussed with the admitting physician, patient and/or family and they are comfortable with the plan.          Final Clinical  Impression(s) / ED Diagnoses Final diagnoses:  Chest pain with high risk for cardiac etiology    Rx / DC Orders ED Discharge Orders     None         Deno Etienne, DO 07/11/21 2208

## 2021-07-11 NOTE — ED Provider Notes (Signed)
Emergency Medicine Provider Triage Evaluation Note  Brian Sullivan , a 76 y.o. male  was evaluated in triage.  Pt complains of episode of chest pain while he was shopping at [employer].  Arrived via EMS her their note, that showed atrial fibrillation on the monitor.  He received 325 aspirin, Zofran, and morphine which resolved his chest pain.  Patient does take erectile dysfunction medications.  Currently asymptomatic.  No fever, chills, shortness of breath  Review of Systems  Positive:  Negative: See above  Physical Exam  Pulse 87    Temp 99.8 F (37.7 C) (Oral)    Resp 16    SpO2 98%  Gen:   Awake, no distress   Resp:  Normal effort  MSK:   Moves extremities without difficulty Other:  Normal rate with irregular rhythm.  Medical Decision Making  Medically screening exam initiated at 3:02 PM.  Appropriate orders placed.  Brian Sullivan was informed that the remainder of the evaluation will be completed by another provider, this initial triage assessment does not replace that evaluation, and the importance of remaining in the ED until their evaluation is complete.     Myna [place], PA-C 07/11/21 1504    Godfrey Pick, MD 07/13/21 [phone]

## 2021-07-11 NOTE — ED Triage Notes (Addendum)
[facility] BIB [facility] with complaint of sudden onset of chest pain radiating into his back while he was shopping with his wife today. Patient reports pain was 10/10 squeezing at onset. 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 Received 324mg  ASA, takes cialis every day, 4mg  zofran, and 4mg  morphine resolved pain. Paitent alert, oriented, and in no apparent distress at this time.

## 2021-07-11 NOTE — ED Notes (Signed)
MSE was signed incorrectly by another patient.

## 2021-07-11 NOTE — ED Notes (Signed)
PT c/o returning CP at this time. PT taken into triage where repeat EKG was obtained. Triage RN notified.

## 2021-07-11 NOTE — H&P (Addendum)
Cardiology Admission History and Physical:   Patient ID: Tallin Hart MRN: [id number]; DOB: Jan 17, 1946   Admission date: 07/11/2021  Primary Care Provider: Nicoletta Dress, MD [facility] Cardiologist: Berniece Salines, DO  [place] Electrophysiologist:  None   Chief Complaint: chest pain   Patient Profile:   Jameal Razzano is a 76 y.o. male with CAD s/p RCA PCI ([place]), GERD, HTN, HLD, who is being seen today for the evaluation of chest pain at the request of Tyrone Nine.   History of Present Illness:   Prior cardiac history  Mr. Thebeau was first evaluated by Dr. Harriet Masson in September 2021 for preop evaluation prior to planned total knee replacement.  Cardiac history includes CAD s/p RCA PCI in [place] (2003) at that time he was told he also had LCx 40% lesion and LAD 40% lesion.  He underwent repeat coronary evaluation in 2015 which showed patent RCA stent and minimal CAD.  He was evaluated with SPECT imaging along with echo as his knee pain limited his exercise capacity and he had baseline abnormal ECG from prior along with a low pitched systolic murmur.  He is statin intolerant and was on Crestor 5 mg per his report, he had been on Repatha in the past but was stopped secondary to cost issues.  Zetia was added to his regimen at that time.  Echo in October 2021 showed a normal EF with moderate dilation of the ascending aorta and mild grade 1 diastolic dysfunction.  Lexiscan had a moderately decreased EF of 30 minutes 44% with stress EF 41% and a medium defect in the lateral LV which was thought to be artifact with no evidence of ischemia.  No further coronary evaluation was performed and he was planned to undergo right total knee replacement however prior to the procedure unfortunately suffered a fall and left hip fracture and so his knee surgery was never completed as he had a rehab from hip surgery first.  He still occasionally uses a cane to assist with ambulation.  During my  evaluation patient is resting comfortably in bed with his wife at bedside. VS: P 71, BP 172/94 (117), RR 17, O2 98%/RA  His wife was able to provide a lot of his medical history as she works as a Designer, jewellery in urology currently and worked in family practice for a number of years.  In 2003 he had a similar episode of chest pain while at work which persisted for 20-30 minutes and went away on its own.  He was sitting at his desk when this happened and reported a fairly severe sudden onset chest pain starting at the left and radiated to the right side of his chest.  He went home later that day and told his wife about it and the next day she had him evaluated and his ECG was reportedly abnormal.  He underwent cardiac catheterization that same day and was found to have "90-95% RCA stenosis with a dominant RCA" per his wife's report.  He had a single BMS placed with no recurrence of symptoms and at that time was also told he had 40% distal LCx and distal LAD blockages without intervention.  He was following with cardiology in [facility] after that episode and had occasional stress test which were normal until 2015 at which point he was referred for repeat coronary evaluation for an abnormal stress test by Dr. Agustin Cree although he had no recurrent symptoms at that time.  He was told that his RCA  was still patent and that his LCx/LAD disease was only ~20%.  He had no recurrence of chest symptoms from 2003 until today's presentation.  Today (07/11/21) he was walking around [place] with his wife and noticed a sudden onset fairly severe 7/10 chest pressure with some radiation to his back but no radiation to arms or jaw.  This lasted for 5-10 minutes and his wife called EMS and by the time they arrived his chest discomfort had improved to 1/10 in severity.  He was given aspirin 324 mg and morphine with complete resolution of symptoms.  Initial ECG did not reveal ischemia and had an isolated PVC. hsT (7->9).   While in the waiting area he had recurrent chest discomfort that was similar to prior in severity (7/10) without any radiation and lasted for an additional 15-20 minutes.  No radiation to back, arms, or jaw.  He was not given any additional medication and it went away on its own.   He denies any orthopnea, PND, SOB at rest, or DOE.  He has had some (15 lb) intentional weight gain over the past year but denies any significant lower extremity swelling other than his left foot status post hip surgery.  He does have history of GERD and takes esomeprazole twice daily but this usually presents with throat burning is never present with chest discomfort before.  He has not missed any of his recent medications.  Medications reviewed  Review of his medications with losartan 50 mg qhs, tadalafil 5 mg qs (wife to verify), propranolol 10 mg bid (for migraines, no migraines recently), esomeprazole 40 mg bid, flomax (wife unsure), amitriptyline 25 mg bid (IBS-C/D), pravastatin 20 mg daily. Not on ASA 2/2 bruising and hasn't been for a number of years. Intermittent voltaren gel.   Code status: FULL code, reviewed with him and wife.   Social history  Lives with his wife in [place] who still works full-time as a Designer, jewellery in urology.  1 daughter through previous marriage with 3 grandchildren who all live locally (18-21-22 yo).  He worked as a Psychologist, educational in a Civil engineer, contracting retired at age 82.  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  He completes all of his ADLs and still drives on his own.  Distant past smoking history in his 52s but only for around 4 pack years.  Family history  Father died in his 108s of "angina" and thought may have been related to heart attack. Mother died at 32 yo from lung disease related to longstanding tobacco abuse.  No other history of cardiac disease.  Past Medical History:  Diagnosis Date   Anxiety 04/05/2020    Balance problem 04/05/2020   Benign hypertensive heart disease without heart failure 04/05/2020   Benign non-nodular prostatic hyperplasia 04/05/2020   CAD (coronary artery disease) 04/05/2020   Cerebral palsy (HCC)    Esophageal dysphagia    Gastro-esophageal reflux 04/05/2020   H. 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 Small hiatal hernia. Mild gastritis   HIP SURGERY Left 04/2020   KNEE SURGERY Right 04/25/2019   1.Partial medial meniscectomy 2.Chondroplasty medical femoral   LAPAROSCOPIC CHOLECYSTECTOMY  02/19/2004   Done at [facility] [place]  06/2008   TENDON REPAIR Right 06/09/2011   Open right distal biceps tendon repair    Medications Prior to Admission: Prior to Admission medications   Medication Sig Start Date End Date Taking? Authorizing Provider  amitriptyline (ELAVIL) 25 MG tablet Take 25 mg by mouth in the morning and at bedtime.   Yes [provider]  diclofenac (VOLTAREN) 75 MG EC tablet Take 75 mg by mouth 2 (two) times daily as needed for moderate pain. 12/25/18  Yes [provider]  esomeprazole (NEXIUM) 40 MG capsule Take 40 mg by mouth 2 (two) times daily before a meal.    Yes [provider]  levothyroxine (SYNTHROID) 50  MCG tablet Take 50 mcg by mouth daily. 02/03/19  Yes [provider]  losartan (COZAAR) 50 MG tablet Take 50 mg by mouth daily.   Yes [provider]  pravastatin (PRAVACHOL) 20 MG tablet Take 20 mg by mouth daily. 06/15/21  Yes [provider]  propranolol (INDERAL) 10 MG tablet TAKE 1 TO 2 TABLETS ONCE DAILY AS NEEDED FOR TENSION HEADACHE. Patient taking differently: Take 10 mg by mouth 2 (two) times daily. 04/24/19  Yes Jaffe, Adam R, DO  Vitamin D, Ergocalciferol, (DRISDOL) 1.25 MG (50000 UT) CAPS capsule Take 50,000 Units by mouth every 7 (seven) days. 02/14/19   [provider]    Allergies:    Allergies  Allergen Reactions   Gemfibrozil Other (See Comments)   Paroxetine Hcl Itching   Statins Other (See Comments)    Leg pain   Sulfamethoxazole Other (See Comments)    Nausea, leg pain    Social History:   Social History   Socioeconomic History   Marital status: Married    Spouse name: [name]   Number of children: 1   Years of education: Not on file   Highest education level: Some college, no degree  Occupational History   Occupation: retired  Tobacco Use   Smoking status: Former    Types: Cigarettes    Quit date: 04/05/1970    Years since quitting: 51.3   Smokeless tobacco: Never  Vaping Use   Vaping Use: Never used  Substance and Sexual Activity   Alcohol use: Yes    Alcohol/week: 0.0 standard drinks    Comment: occas.   Drug use: No   Sexual activity: Not on file  Other Topics Concern   Not on file  Social History Narrative   Patient is right-handed. He lives with his wife in a 1 story house. He drinks 1 cup of coffee day, he does not exercise. Volunteers at hospital.    Social Determinants of Health   Financial Resource Strain: Not on file  Food Insecurity: Not on file  Transportation Needs: Not on file  Physical Activity: Not on file  Stress: Not on file  Social Connections: Not on file  Intimate Partner Violence: Not on  file    Family History:   The patient's family history includes CAD in his father and mother;  COPD in his mother; Emphysema in his mother; Esophageal cancer in his brother; Headache in his mother; Heart attack in his father; Hypertension in his father and mother. There is no history of Colon cancer, Pancreatic cancer, or Stomach cancer.    ROS:   Review of Systems: [y] = yes, [ ]  = no      General: Weight gain [ ] ; Weight loss [ ] ; Anorexia [ ] ; Fatigue [ ] ; Fever [ ] ; Chills [ ] ; Weakness [ ]    Cardiac: Chest pain/pressure [y]; Resting SOB [ ] ; Exertional SOB [ ] ; Orthopnea [ ] ; Pedal Edema [ ] ; Palpitations [ ] ; Syncope [ ] ; Presyncope [ ] ; Paroxysmal nocturnal dyspnea [ ]    Pulmonary: Cough [ ] ; Wheezing [ ] ; Hemoptysis [ ] ; Sputum [ ] ; Snoring [ ]    GI: Vomiting [ ] ; Dysphagia [ ] ; Melena [ ] ; Hematochezia [ ] ; Heartburn [ ] ; Abdominal pain [ ] ; Constipation [ ] ; Diarrhea [ ] ; BRBPR [ ]    GU: Hematuria [ ] ; Dysuria [ ] ; Nocturia [ ]  Vascular: Pain in legs with walking [ ] ; Pain in feet with lying flat [ ] ; Non-healing sores [ ] ; Stroke [ ] ; TIA [ ] ; Slurred speech [ ] ;   Neuro: Headaches [ ] ; Vertigo [ ] ; Seizures [ ] ; Paresthesias [ ] ;Blurred vision [ ] ; Diplopia [ ] ; Vision changes [ ]    Ortho/Skin: Arthritis [ ] ; Joint pain [ ] ; Muscle pain [ ] ; Joint swelling [ ] ; Back Pain [ ] ; Rash [ ]    Psych: Depression [ ] ; Anxiety [ ]    Heme: Bleeding problems [ ] ; Clotting disorders [ ] ; Anemia [ ]    Endocrine: Diabetes [ ] ; Thyroid dysfunction [ ]    Physical Exam/Data:   Vitals:   07/11/21 2030 07/11/21 2145 07/11/21 2330 07/11/21 2345  BP: (!) 168/85 (!) 155/96 137/82 128/85  Pulse: (!) 48 91 79 74  Resp: 20 (!) 23 17 16   Temp:      TempSrc:      SpO2: 97% 93% 93% 93%   No intake or output data in the 24 hours ending 07/11/21 2357 Last 3 Weights 01/20/2021 11/30/2020 07/12/2020  Weight (lbs) 197 lb 197 lb 6 oz 199 lb 3.2 oz  Weight (kg) 89.359 kg 89.529 kg 90.357 kg     There is no  height or weight on file to calculate BMI.  General:  Well nourished, well developed, in no acute distress HEENT: normal Lymph: no adenopathy Neck: no JVD Endocrine:  No thryomegaly Vascular: No carotid bruits; FA pulses 2+ bilaterally without bruits  Cardiac:  normal S1, S2; RRR; 2/6 murmur most prominent at RUSB Lungs:  clear to auscultation bilaterally, no wheezing, rhonchi or rales  Abd: soft, nontender, no hepatomegaly  Ext: no edema Musculoskeletal:  No deformities, BUE and BLE strength normal and equal Skin: warm and dry  Neuro:  CNs 2-12 intact, no focal abnormalities noted Psych:  Normal affect   EKG:  The EKG was personally reviewed and demonstrates:   07/11/21 (19:41:51): NSR 92, PR 160, QRS 94, Qtc 440, no dynamic ischemic changes 07/11/21 (15:02:28): NSR 87, isolated PVCs, PR 146, QRS 88, Qtc 445, no dynamic ischemic changes  Telemetry:  Telemetry was personally reviewed and demonstrates: sinus rhythm with occasional isolated PVCs   Relevant CV Studies:  SPECT Result date: 04/15/20 The left ventricular ejection fraction is moderately decreased (30-44%). Nuclear stress EF: 41%. There was no ST segment deviation noted during stress. Defect 1: There is a  medium defect, in lateral segment of LV. Artifactual No evidence of ischemia   TTE Result date: 04/09/20  1. Left ventricular ejection fraction, by estimation, is 55 to 60%. The  left ventricle has normal function. The left ventricle has no regional  wall motion abnormalities. Left ventricular diastolic parameters are  consistent with Grade I diastolic  dysfunction (impaired relaxation).   2. Right ventricular systolic function is normal. The right ventricular  size is normal. There is normal pulmonary artery systolic pressure.   3. The mitral valve is degenerative. Trivial mitral valve regurgitation.  No evidence of mitral stenosis. Moderate mitral annular calcification.   4. The aortic valve has an indeterminant  number of cusps. Aortic valve  regurgitation is not visualized. Mild to moderate aortic valve  sclerosis/calcification is present, without any evidence of aortic  stenosis.   5. There is moderate dilatation of the ascending aorta, measuring 42 mm.   6. The inferior vena cava is normal in size with greater than 50%  respiratory variability, suggesting right atrial pressure of 3 mmHg.    Laboratory Data:  High Sensitivity Troponin:   Recent Labs  Lab 07/11/21 1510 07/11/21 1703 07/11/21 2039  TROPONINIHS 7 9 7       Chemistry Recent Labs  Lab 07/11/21 1510  NA 137  K 4.1  CL 104  CO2 24  GLUCOSE 91  BUN 13  CREATININE 1.17  CALCIUM 9.0  GFRNONAA >60  ANIONGAP 9    No results for input(s): PROT, ALBUMIN, AST, ALT, ALKPHOS, BILITOT in the last 168 hours. Hematology Recent Labs  Lab 07/11/21 1510  WBC 8.7  RBC 5.06  HGB 15.3  HCT 46.9  MCV 92.7  MCH 30.2  MCHC 32.6  RDW 13.1  PLT 155   BNPNo results for input(s): BNP, PROBNP in the last 168 hours.  DDimer No results for input(s): DDIMER in the last 168 hours.  Radiology/Studies:  DG Chest 2 View  Result Date: 07/11/2021 CLINICAL DATA:  Sudden onset chest pain radiating to back EXAM: CHEST - 2 VIEW COMPARISON:  04/18/2020 FINDINGS: The heart size and mediastinal contours are within normal limits. Both lungs are clear. The visualized skeletal structures are unremarkable. IMPRESSION: No active cardiopulmonary disease. Electronically Signed   By: Randa Ngo M.D.   On: 07/11/2021 15:23    HEART score 5-6 mainly driven by RF and concerning story.   Assessment and Plan:   Possible cardiac chest pain  CAD s/p RCA PCI with BMS (2003)  Mr. Maslow presents with a concerning story for exertional angina with now a rest component and known prior CAD.  Despite his normal troponins I think he warrants observation potentially repeat coronary angiography as his symptoms are somewhat similar to his distant prior episode and he  has no known poorly controlled HLD and HTN.  Most recent coronary evaluation was in 2015 and showed patent RCA stent and nonobstructive CAD in his LCx and LAD.  He had a Lexiscan last year which was thought to have an artifactual defect unrelated to his prior CAD and although his echo EF was normal, his stress EF did have a decline.  Given that he was asymptomatic and this was preop clearance no further evaluation was performed.  Of note his wife is a Designer, jewellery reports that during his distant past episode his troponins were also negative although this was not in the hsT period.  He does have GERD but takes twice daily PPI in this does not seem similar  to these episodes.  Of note he was trialed on Repatha in the past and had no side effects but was upset about how expensive the drug was and although he and his wife could not afford the co-pay he just did not feel like it was necessary at that cost ($400/mo copay).  He is currently on pravastatin 20 mg daily.  We discussed different management options including up titration of his medical therapy versus expedited coronary evaluation and that although there is not mortality benefit there could be symptom improvement if obstructive lesion present.  He and his wife are very reasonable about work-up and are okay deferring evaluation if he has no recurrent symptoms that are also fine proceeding with coronary evaluation. Prior stent, and age with known CAD makes coronary CT less ideal. He cannot exercise as he never had his right knee repair following hip surgery and Lexiscan last year was thought to have an artifactual defect however in the context of recurrent chest pain I would not feel that this would be adequate to rule out obstructive disease. My bias given that he is very functional and does not have significant comorbidities would be to proceed with repeat coronary angiography.  - hsT ruled out, ECG reassuring - NPO MN, likely repeat coronary angiography  if scheduling available for expedited IP eval, otherwise uptitrate GDMT and repeat echo to ensure no new WMAs - start ASA 81 mg daily, s/p ASA 324 mg with EMS  - no P2Y12i yet - continue home losartan 50 mg qhs  HTN BP 170s initially with systolics improving to 696E prior to meds being given - continue losartan 50 mg qhs - add amlodipine if BP still uncontrolled overnight  - can add imdur if recurrent sx with HTN  HLD - continue pravastatin 20 mg daily  - add zetia 10 mg daily  - explained that he will likely need to go back on Repatha with known CAD and possible progression of CAD in the setting of uncontrolled HLD, he understands this and is ok with copay, need to coordinate for OP  - f/u repeat lipids (wife says LDL 150-200 for years) but none recently in our charts  Migraines - continue propranolol 10 mg bid   Mild cerebral palsy  Mild R sided weakness, no medications   IBS C/D - continue amitriptyline 25 mg bid   Hypothyroidism  - TSH pending  - continue home synthroid 50 mcg qAM   *if patient proceeds with coronary evaluation and obstructive disease is identified please change OBSERVATION to INPATIENT admission for further medication management.  Severity of Illness: The appropriate patient status for this patient is OBSERVATION. Observation status is judged to be reasonable and necessary in order to provide the required intensity of service to ensure the patient's safety. The patient's presenting symptoms, physical exam findings, and initial radiographic and laboratory data in the context of their medical condition is felt to place them at decreased risk for further clinical deterioration. Furthermore, it is anticipated that the patient will be medically stable for discharge from the hospital within 2 midnights of admission.    For questions or updates, please contact [place] Please consult [email or web address] for contact info under   Signed, Dion Body, MD   07/11/2021 11:57 PM

## 2021-07-12 ENCOUNTER — Other Ambulatory Visit: Payer: Self-pay

## 2021-07-12 ENCOUNTER — Ambulatory Visit (HOSPITAL_COMMUNITY): Payer: Medicare Other

## 2021-07-12 DIAGNOSIS — K219 Gastro-esophageal reflux disease without esophagitis: Secondary | ICD-10-CM | POA: Diagnosis present

## 2021-07-12 DIAGNOSIS — Z01810 Encounter for preprocedural cardiovascular examination: Secondary | ICD-10-CM | POA: Diagnosis not present

## 2021-07-12 DIAGNOSIS — E559 Vitamin D deficiency, unspecified: Secondary | ICD-10-CM | POA: Diagnosis present

## 2021-07-12 DIAGNOSIS — N400 Enlarged prostate without lower urinary tract symptoms: Secondary | ICD-10-CM | POA: Diagnosis present

## 2021-07-12 DIAGNOSIS — Z87891 Personal history of nicotine dependence: Secondary | ICD-10-CM | POA: Diagnosis not present

## 2021-07-12 DIAGNOSIS — E039 Hypothyroidism, unspecified: Secondary | ICD-10-CM | POA: Diagnosis present

## 2021-07-12 DIAGNOSIS — Z20822 Contact with and (suspected) exposure to covid-19: Secondary | ICD-10-CM | POA: Diagnosis present

## 2021-07-12 DIAGNOSIS — I25110 Atherosclerotic heart disease of native coronary artery with unstable angina pectoris: Secondary | ICD-10-CM | POA: Diagnosis present

## 2021-07-12 DIAGNOSIS — Z825 Family history of asthma and other chronic lower respiratory diseases: Secondary | ICD-10-CM | POA: Diagnosis not present

## 2021-07-12 DIAGNOSIS — I200 Unstable angina: Secondary | ICD-10-CM | POA: Diagnosis not present

## 2021-07-12 DIAGNOSIS — I493 Ventricular premature depolarization: Secondary | ICD-10-CM | POA: Diagnosis present

## 2021-07-12 DIAGNOSIS — E785 Hyperlipidemia, unspecified: Secondary | ICD-10-CM | POA: Diagnosis not present

## 2021-07-12 DIAGNOSIS — G809 Cerebral palsy, unspecified: Secondary | ICD-10-CM | POA: Diagnosis present

## 2021-07-12 DIAGNOSIS — Z79890 Hormone replacement therapy (postmenopausal): Secondary | ICD-10-CM | POA: Diagnosis not present

## 2021-07-12 DIAGNOSIS — Z800 Family history of malignant neoplasm of digestive organs: Secondary | ICD-10-CM | POA: Diagnosis not present

## 2021-07-12 DIAGNOSIS — Z96642 Presence of left artificial hip joint: Secondary | ICD-10-CM | POA: Diagnosis present

## 2021-07-12 DIAGNOSIS — R0789 Other chest pain: Secondary | ICD-10-CM | POA: Diagnosis present

## 2021-07-12 DIAGNOSIS — Z8249 Family history of ischemic heart disease and other diseases of the circulatory system: Secondary | ICD-10-CM | POA: Diagnosis not present

## 2021-07-12 DIAGNOSIS — I214 Non-ST elevation (NSTEMI) myocardial infarction: Secondary | ICD-10-CM | POA: Diagnosis not present

## 2021-07-12 DIAGNOSIS — E7800 Pure hypercholesterolemia, unspecified: Secondary | ICD-10-CM | POA: Diagnosis present

## 2021-07-12 DIAGNOSIS — I119 Hypertensive heart disease without heart failure: Secondary | ICD-10-CM | POA: Diagnosis present

## 2021-07-12 DIAGNOSIS — Z955 Presence of coronary angioplasty implant and graft: Secondary | ICD-10-CM | POA: Diagnosis not present

## 2021-07-12 DIAGNOSIS — I480 Paroxysmal atrial fibrillation: Secondary | ICD-10-CM | POA: Diagnosis not present

## 2021-07-12 DIAGNOSIS — K589 Irritable bowel syndrome without diarrhea: Secondary | ICD-10-CM | POA: Diagnosis present

## 2021-07-12 DIAGNOSIS — Z85828 Personal history of other malignant neoplasm of skin: Secondary | ICD-10-CM | POA: Diagnosis not present

## 2021-07-12 DIAGNOSIS — Z79899 Other long term (current) drug therapy: Secondary | ICD-10-CM | POA: Diagnosis not present

## 2021-07-12 DIAGNOSIS — I10 Essential (primary) hypertension: Secondary | ICD-10-CM | POA: Diagnosis not present

## 2021-07-12 DIAGNOSIS — I350 Nonrheumatic aortic (valve) stenosis: Secondary | ICD-10-CM | POA: Diagnosis present

## 2021-07-12 DIAGNOSIS — R079 Chest pain, unspecified: Secondary | ICD-10-CM | POA: Diagnosis not present

## 2021-07-12 DIAGNOSIS — D62 Acute posthemorrhagic anemia: Secondary | ICD-10-CM | POA: Diagnosis not present

## 2021-07-12 DIAGNOSIS — I2510 Atherosclerotic heart disease of native coronary artery without angina pectoris: Secondary | ICD-10-CM | POA: Diagnosis not present

## 2021-07-12 LAB — ECHOCARDIOGRAM COMPLETE
AV Mean grad: 6.7 mmHg
AV Peak grad: 12.5 mmHg
Ao pk vel: 1.77 m/s
Area-P 1/2: 3.33 cm2
Height: 71 in
Weight: 3248 oz

## 2021-07-12 LAB — LIPID PANEL
Cholesterol: 191 mg/dL (ref 0–200)
HDL: 45 mg/dL (ref >40)
LDL Cholesterol: 118 mg/dL — ABNORMAL HIGH (ref 0–99)
Total CHOL/HDL Ratio: 4.2 RATIO
Triglycerides: 140 mg/dL (ref <150)
VLDL: 28 mg/dL (ref 0–40)

## 2021-07-12 LAB — RESP PANEL BY RT-PCR (FLU A&B, COVID) ARPGX2
Influenza A by PCR: NEGATIVE
Influenza B by PCR: NEGATIVE
SARS Coronavirus 2 by RT PCR: NEGATIVE

## 2021-07-12 LAB — HEPARIN LEVEL (UNFRACTIONATED): Heparin Unfractionated: 0.49 IU/mL (ref 0.30–0.70)

## 2021-07-12 LAB — HEMOGLOBIN A1C
Hgb A1c MFr Bld: 5.4 % (ref 4.8–5.6)
Mean Plasma Glucose: 108.28 mg/dL

## 2021-07-12 LAB — PROTIME-INR
INR: 1 (ref 0.8–1.2)
Prothrombin Time: 13.6 seconds (ref 11.4–15.2)

## 2021-07-12 LAB — TSH: TSH: 2.228 u[IU]/mL (ref 0.350–4.500)

## 2021-07-12 MED ORDER — ASPIRIN 81 MG PO CHEW
81.0000 mg | CHEWABLE_TABLET | ORAL | Status: AC
  Administered 2021-07-13: 81 mg via ORAL
  Filled 2021-07-12: qty 1

## 2021-07-12 MED ORDER — ISOSORBIDE MONONITRATE ER 30 MG PO TB24
30.0000 mg | ORAL_TABLET | Freq: Every day | ORAL | Status: DC
  Administered 2021-07-12 – 2021-07-14 (×3): 30 mg via ORAL
  Filled 2021-07-12 (×3): qty 1

## 2021-07-12 MED ORDER — SODIUM CHLORIDE 0.9 % WEIGHT BASED INFUSION: 1.0000 mL/kg/h | INTRAVENOUS | Status: DC

## 2021-07-12 MED ORDER — CARVEDILOL 12.5 MG PO TABS
12.5000 mg | ORAL_TABLET | Freq: Two times a day (BID) | ORAL | Status: DC
  Administered 2021-07-12 (×2): 12.5 mg via ORAL
  Filled 2021-07-12 (×2): qty 1

## 2021-07-12 MED ORDER — SODIUM CHLORIDE 0.9 % IV SOLN: 250.0000 mL | INTRAVENOUS | Status: DC | PRN

## 2021-07-12 MED ORDER — PERFLUTREN LIPID MICROSPHERE
1.0000 mL | INTRAVENOUS | Status: AC | PRN
  Administered 2021-07-12: 2 mL via INTRAVENOUS
  Filled 2021-07-12: qty 10

## 2021-07-12 MED ORDER — SODIUM CHLORIDE 0.9% FLUSH
3.0000 mL | Freq: Two times a day (BID) | INTRAVENOUS | Status: DC
  Administered 2021-07-12: 3 mL via INTRAVENOUS

## 2021-07-12 MED ORDER — LOSARTAN POTASSIUM 50 MG PO TABS
100.0000 mg | ORAL_TABLET | Freq: Every day | ORAL | Status: DC
  Administered 2021-07-12 – 2021-07-14 (×2): 100 mg via ORAL
  Filled 2021-07-12 (×2): qty 2

## 2021-07-12 MED ORDER — HEPARIN BOLUS VIA INFUSION
4000.0000 [IU] | Freq: Once | INTRAVENOUS | Status: AC
  Administered 2021-07-12: 4000 [IU] via INTRAVENOUS
  Filled 2021-07-12: qty 4000

## 2021-07-12 MED ORDER — CARVEDILOL 6.25 MG PO TABS
6.2500 mg | ORAL_TABLET | Freq: Two times a day (BID) | ORAL | Status: DC
  Administered 2021-07-13 – 2021-07-14 (×4): 6.25 mg via ORAL
  Filled 2021-07-12 (×4): qty 1

## 2021-07-12 MED ORDER — ASPIRIN 81 MG PO CHEW
81.0000 mg | CHEWABLE_TABLET | Freq: Every day | ORAL | Status: DC
  Administered 2021-07-14: 81 mg via ORAL
  Filled 2021-07-12: qty 1

## 2021-07-12 MED ORDER — ASPIRIN 81 MG PO CHEW
81.0000 mg | CHEWABLE_TABLET | Freq: Every day | ORAL | Status: DC
  Administered 2021-07-12: 81 mg via ORAL
  Filled 2021-07-12: qty 1

## 2021-07-12 MED ORDER — HEPARIN (PORCINE) 25000 UT/250ML-% IV SOLN
1250.0000 [IU]/h | INTRAVENOUS | Status: DC
  Administered 2021-07-12: 1250 [IU]/h via INTRAVENOUS
  Filled 2021-07-12 (×2): qty 250

## 2021-07-12 MED ORDER — SODIUM CHLORIDE 0.9% FLUSH: 3.0000 mL | INTRAVENOUS | Status: DC | PRN

## 2021-07-12 MED ORDER — SODIUM CHLORIDE 0.9 % WEIGHT BASED INFUSION
3.0000 mL/kg/h | INTRAVENOUS | Status: DC
  Administered 2021-07-13: 3 mL/kg/h via INTRAVENOUS

## 2021-07-12 NOTE — Progress Notes (Addendum)
ANTICOAGULATION CONSULT NOTE - Initial Consult  Pharmacy Consult for Heparin Indication: chest pain/ACS  Allergies  Allergen Reactions   Gemfibrozil Other (See Comments)   Paroxetine Hcl Itching   Statins Other (See Comments)    Leg pain   Sulfamethoxazole Other (See Comments)    Nausea, leg pain     Patient Measurements: Height: 5\' 11"  (180.3 cm) Weight: 92.1 kg (203 lb) IBW/kg (Calculated) : 75.3 Heparin Dosing Weight:  92.1 kg  Vital Signs: Temp: 97.9 F (36.6 C) (01/03 0758) Temp Source: Oral (01/03 0758) BP: 145/85 (01/03 0848) Pulse Rate: 63 (01/03 0848)  Labs: Recent Labs    07/11/21 1510 07/11/21 1703 07/11/21 2039 07/12/21 0615  HGB 15.3  --   --   --   HCT 46.9  --   --   --   PLT 155  --   --   --   LABPROT  --   --   --  13.6  INR  --   --   --  1.0  CREATININE 1.17  --   --   --   TROPONINIHS 7 9 7   --     Estimated Creatinine Clearance: 61.3 mL/min (by C-G formula based on SCr of 1.17 mg/dL).   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 ED noted in afib. - takes daily Cialis per ED note but med rec says last dose last week, no immediate rescue nitrates given. -  EKG without concerning ischemic findings.  2 troponins are negative.   PMH: history of near-complete RCA occlusion and has cardiac stents.GERD, HTN, HLD, anxiety, BPH, cerbral palsy, dysphagia, h/o HPylori, hypogonadism, IBS, irritable bladder, migraines, plantar fascitis, Rupture of distal biceps tendon, skin cancer, Vit D def  Anticoag: CP. Start IV heparin. Baseline CBC WNL  Goal of Therapy:  Heparin level 0.3-0.7 units/ml Monitor platelets by anticoagulation protocol: Yes   Plan:  Cath 1/4 Iv heparin 4000 unit IV bolus Heparin infusion at 1250 units/hr Check hep level in 6-8 hrs Daily HL and CBC   Amiree No S. Alford Highland, PharmD, BCPS Clinical Staff Pharmacist [email or web address] Alford Highland, Dereka Lueras Stillinger 07/12/2021,8:54 AM

## 2021-07-12 NOTE — Progress Notes (Signed)
ANTICOAGULATION CONSULT NOTE - Follow Up Consult  Pharmacy Consult for IV Heparin Indication: chest pain/ACS  Allergies  Allergen Reactions   Gemfibrozil Other (See Comments)   Paroxetine Hcl Itching   Statins Other (See Comments)    Leg pain   Sulfamethoxazole Other (See Comments)    Nausea, leg pain     Patient Measurements: Height: 5\' 11"  (180.3 cm) Weight: 92.1 kg (203 lb) IBW/kg (Calculated) : 75.3 Heparin Dosing Weight:  92.1 kg  Vital Signs: Temp: 98.4 F (36.9 C) (01/03 1529) Temp Source: Oral (01/03 1529) BP: 106/83 (01/03 1724) Pulse Rate: 75 (01/03 1529)  Labs: Recent Labs    07/11/21 1510 07/11/21 1703 07/11/21 2039 07/12/21 0615 07/12/21 1654  HGB 15.3  --   --   --   --   HCT 46.9  --   --   --   --   PLT 155  --   --   --   --   LABPROT  --   --   --  13.6  --   INR  --   --   --  1.0  --   HEPARINUNFRC  --   --   --   --  0.49  CREATININE 1.17  --   --   --   --   TROPONINIHS 7 9 7   --   --     Estimated Creatinine Clearance: 61.3 mL/min (by C-G formula based on SCr of 1.17 mg/dL).   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 Pharmacy was consulted to dose IV heparin for ACS. Pt was not on anticoagulant PTA. Cath planned for 07/13/21.  Initial heparin level ~8 hrs after heparin 4000 units IV bolus X 1, followed by heparin infusion at 1250 units/hr, was 0.49 units/ml, which is within the goal range for this pt. H/H 15.3/46.9, plt 155, INR 1.0. Per RN, no issues with IV or bleeding observed.  Goal of Therapy:  Heparin level 0.3-0.7 units/ml Monitor platelets by anticoagulation protocol: Yes   Plan:  Continue heparin infusion at 1250 units/hr Check confirmatory heparin level in ~7 hrs Monitor daily heparin level, CBC Monitor for bleeding F/U after cath tomorrow, 07/13/21  Gillermina Hu, PharmD, BCPS, [facility] Clinical Pharmacist 07/12/2021,5:53 PM

## 2021-07-12 NOTE — Progress Notes (Signed)
Cardiology Progress Note  Patient ID: Brian Sullivan MRN: [id number] DOB: [birth year] Date of Encounter: 07/12/2021  Primary Cardiologist: Berniece Salines, DO  Subjective   Chief Complaint: Chest pain  HPI: Admitted with 2 episodes of substernal chest pressure.  Prolonged episodes lasting up to 15 to 20 minutes.  Not exertional but similar in character to prior chest pain prior to RCA stent.  Symptoms are concerning for unstable angina.  EKG unremarkable.  Enzymes are negative.  Left heart catheterization cannot be performed today.  Plan for tomorrow.  ROS:  All other ROS reviewed and negative. Pertinent positives noted in the HPI.     Inpatient Medications  Scheduled Meds:  amitriptyline  25 mg Oral BID   carvedilol  12.5 mg Oral BID WC   ezetimibe  10 mg Oral Daily   isosorbide mononitrate  30 mg Oral Daily   levothyroxine  50 mcg Oral Q0600   losartan  100 mg Oral Daily   pravastatin  20 mg Oral Daily   Continuous Infusions:  PRN Meds:    Vital Signs   Vitals:   07/12/21 0400 07/12/21 0715 07/12/21 0758 07/12/21 0800  BP: (!) 145/75 135/90    Pulse: 62 (!) 58    Resp: 14 16    Temp:   97.9 F (36.6 C)   TempSrc:   Oral   SpO2: 94% 94%    Weight:    92.1 kg  Height:    5\' 11"  (1.803 m)    Intake/Output Summary (Last 24 hours) at 07/12/2021 0833 Last data filed at 07/12/2021 0739 Gross per 24 hour  Intake --  Output 600 ml  Net -600 ml   Last 3 Weights 07/12/2021 01/20/2021 11/30/2020  Weight (lbs) 203 lb 197 lb 197 lb 6 oz  Weight (kg) 92.08 kg 89.359 kg 89.529 kg      Telemetry  Overnight telemetry shows sinus rhythm in the 60s with PVCs, which I personally reviewed.   ECG  The most recent ECG shows normal sinus rhythm heart rate 72, LVH by voltage, nonspecific ST-T changes, which I personally reviewed.   Physical Exam   Vitals:   07/12/21 0400 07/12/21 0715 07/12/21 0758 07/12/21 0800  BP: (!) 145/75 135/90    Pulse: 62 (!) 58    Resp: 14 16    Temp:   97.9  F (36.6 C)   TempSrc:   Oral   SpO2: 94% 94%    Weight:    92.1 kg  Height:    5\' 11"  (1.803 m)    Intake/Output Summary (Last 24 hours) at 07/12/2021 0833 Last data filed at 07/12/2021 0739 Gross per 24 hour  Intake --  Output 600 ml  Net -600 ml    Last 3 Weights 07/12/2021 01/20/2021 11/30/2020  Weight (lbs) 203 lb 197 lb 197 lb 6 oz  Weight (kg) 92.08 kg 89.359 kg 89.529 kg    Body mass index is 28.31 kg/m.  General: Well nourished, well developed, in no acute distress Head: Atraumatic, normal size  Eyes: PEERLA, EOMI  Neck: Supple, no JVD Endocrine: No thryomegaly Cardiac: Normal S1, S2; RRR; no murmurs, rubs, or gallops Lungs: Clear to auscultation bilaterally, no wheezing, rhonchi or rales  Abd: Soft, nontender, no hepatomegaly  Ext: No edema, pulses 2+ Musculoskeletal: No deformities, BUE and BLE strength normal and equal Skin: Warm and dry, no rashes   Neuro: Alert and oriented to person, place, time, and situation, CNII-XII grossly intact, no focal deficits  Psych: Normal  mood and affect   Labs  High Sensitivity Troponin:   Recent Labs  Lab 07/11/21 1510 07/11/21 1703 07/11/21 2039  TROPONINIHS 7 9 7      Cardiac EnzymesNo results for input(s): TROPONINI in the last 168 hours. No results for input(s): TROPIPOC in the last 168 hours.  Chemistry Recent Labs  Lab 07/11/21 1510  NA 137  K 4.1  CL 104  CO2 24  GLUCOSE 91  BUN 13  CREATININE 1.17  CALCIUM 9.0  GFRNONAA >60  ANIONGAP 9    Hematology Recent Labs  Lab 07/11/21 1510  WBC 8.7  RBC 5.06  HGB 15.3  HCT 46.9  MCV 92.7  MCH 30.2  MCHC 32.6  RDW 13.1  PLT 155   BNPNo results for input(s): BNP, PROBNP in the last 168 hours.  DDimer No results for input(s): DDIMER in the last 168 hours.   Radiology  DG Chest 2 View  Result Date: 07/11/2021 CLINICAL DATA:  Sudden onset chest pain radiating to back EXAM: CHEST - 2 VIEW COMPARISON:  04/18/2020 FINDINGS: The heart size and mediastinal contours  are within normal limits. Both lungs are clear. The visualized skeletal structures are unremarkable. IMPRESSION: No active cardiopulmonary disease. Electronically Signed   By: Randa Ngo M.D.   On: 07/11/2021 15:23    Cardiac Studies  NM Stress 04/15/2020 The left ventricular ejection fraction is moderately decreased (30-44%). Nuclear stress EF: 41%. There was no ST segment deviation noted during stress. Defect 1: There is a medium defect, in lateral segment of LV. Artifactual No evidence of ischemia  Patient Profile  Brian Sullivan is a 76 y.o. male with CAD status post RCA PCI in 2003, GERD, hypertension, hypothyroidism, hyperlipidemia who was admitted on 07/11/2021 for unstable angina.  Assessment & Plan   #Unstable angina -Developed 15 minutes of substernal chest pressure while walking yesterday.  Symptoms resolved without intervention.  Had a similar episode in the emergency room here while waiting. -Cardiac enzymes are negative.  EKG with nonspecific ST-T changes.  Telemetry with PVCs. -History of CAD with PCI to the RCA in 2003.  He had mild disease at a heart catheterization that was repeated in 2015.  Recent stress test in 2021 demonstrated possible lateral wall infarct. -Cardiac examination normal. -Overall symptoms are concerning for unstable angina.  Cardiac enzymes and EKG can be negative still in the setting of severe stenosis.  Given his symptoms that occurred at rest I recommend left heart catheterization.  He unfortunately cannot have this done today due to scheduling.  N.p.o. at midnight for left heart catheterization.  Risk and benefits explained. -Echo pending. -TSH 2.2. -A1c 5.4. -LDL 118.  Apparently cannot take Crestor or Lipitor.  Zetia 10 mg daily started.  He was on Repatha in the past.  Should consider this moving forward versus inclusirin.  Started on pravastatin 20 mg daily overnight.  We will continue to see if he tolerates this. -We will go ahead and add  heparin.  Continue heparin drip until Tomorrow. -Change propranolol to Coreg 12.5 mg twice daily.  Continue home BP medications. -Add Imdur 30 mg daily.  #HTN -Change propranolol to Coreg 12.5 mg twice daily, losartan 100 mg daily.  Add Imdur 30 mg daily.  #HLD -Intolerant of Crestor and Lipitor.  Add Zetia 10 mg daily.  Challenge on pravastatin 20 mg daily.  Outpatient consideration for PCSK9 inhibitor therapy versus inclusirin.   #Hypothyroidism -home synthroid  #FEN -no IVF -diet: heart healthy.  N.p.o. at midnight  for left heart catheterization. -VTE Ppx: Heparin drip -Code: Full  For questions or updates, please contact [facility] [facility] [place] [facility] Please consult [email or web address] for contact info under   Time Spent with Patient: I have spent a total of 35 minutes with patient reviewing hospital notes, telemetry, EKGs, labs and examining the patient as well as establishing an assessment and plan that was discussed with the patient.  > 50% of time was spent in direct patient care.    Signed, Addison Naegeli. Audie Box, MD, [place]  07/12/2021 8:33 AM

## 2021-07-13 ENCOUNTER — Encounter (HOSPITAL_COMMUNITY): Payer: Self-pay | Admitting: Cardiovascular Disease

## 2021-07-13 ENCOUNTER — Encounter (HOSPITAL_COMMUNITY)
Admission: EM | Disposition: A | Discharge status: Home Health | Payer: Self-pay | Source: Home / Self Care | Attending: Cardiothoracic Surgery

## 2021-07-13 ENCOUNTER — Inpatient Hospital Stay (HOSPITAL_COMMUNITY): Payer: Medicare Other

## 2021-07-13 DIAGNOSIS — I214 Non-ST elevation (NSTEMI) myocardial infarction: Secondary | ICD-10-CM

## 2021-07-13 DIAGNOSIS — E785 Hyperlipidemia, unspecified: Secondary | ICD-10-CM

## 2021-07-13 DIAGNOSIS — I10 Essential (primary) hypertension: Secondary | ICD-10-CM

## 2021-07-13 DIAGNOSIS — I25110 Atherosclerotic heart disease of native coronary artery with unstable angina pectoris: Secondary | ICD-10-CM

## 2021-07-13 DIAGNOSIS — I200 Unstable angina: Secondary | ICD-10-CM | POA: Diagnosis not present

## 2021-07-13 HISTORY — PX: LEFT HEART CATH AND CORONARY ANGIOGRAPHY: CATH118249

## 2021-07-13 LAB — URINALYSIS, ROUTINE W REFLEX MICROSCOPIC
Bilirubin Urine: NEGATIVE
Glucose, UA: NEGATIVE mg/dL
Hgb urine dipstick: NEGATIVE
Ketones, ur: NEGATIVE mg/dL
Leukocytes,Ua: NEGATIVE
Nitrite: NEGATIVE
Protein, ur: NEGATIVE mg/dL
Specific Gravity, Urine: 1.033 — ABNORMAL HIGH (ref 1.005–1.030)
pH: 5 (ref 5.0–8.0)

## 2021-07-13 LAB — PULMONARY FUNCTION TEST
FEF 25-75 Pre: 2.78 L/sec
FEF2575-%Pred-Pre: 130 %
FEV1-%Pred-Pre: 93 %
FEV1-Pre: 2.79 L
FEV1FVC-%Pred-Pre: 110 %
FEV6-%Pred-Pre: 90 %
FEV6-Pre: 3.51 L
FEV6FVC-%Pred-Pre: 106 %
FVC-%Pred-Pre: 84 %
FVC-Pre: 3.51 L
Pre FEV1/FVC ratio: 80 %
Pre FEV6/FVC Ratio: 100 %

## 2021-07-13 LAB — CBC
HCT: 43.7 % (ref 39.0–52.0)
Hemoglobin: 14.6 g/dL (ref 13.0–17.0)
MCH: 30.9 pg (ref 26.0–34.0)
MCHC: 33.4 g/dL (ref 30.0–36.0)
MCV: 92.6 fL (ref 80.0–100.0)
Platelets: 122 10*3/uL — ABNORMAL LOW (ref 150–400)
RBC: 4.72 MIL/uL (ref 4.22–5.81)
RDW: 13.1 % (ref 11.5–15.5)
WBC: 8.3 10*3/uL (ref 4.0–10.5)
nRBC: 0 % (ref 0.0–0.2)

## 2021-07-13 LAB — HEPARIN LEVEL (UNFRACTIONATED)
Heparin Unfractionated: 0.56 IU/mL (ref 0.30–0.70)
Heparin Unfractionated: 0.19 IU/mL — ABNORMAL LOW (ref 0.30–0.70)

## 2021-07-13 SURGERY — LEFT HEART CATH AND CORONARY ANGIOGRAPHY
Anesthesia: LOCAL

## 2021-07-13 MED ORDER — LIDOCAINE HCL (PF) 1 % IJ SOLN
INTRAMUSCULAR | Status: AC
  Filled 2021-07-13: qty 30

## 2021-07-13 MED ORDER — FENTANYL CITRATE (PF) 100 MCG/2ML IJ SOLN
INTRAMUSCULAR | Status: DC | PRN
  Administered 2021-07-13: 25 ug via INTRAVENOUS

## 2021-07-13 MED ORDER — ALUM & MAG HYDROXIDE-SIMETH 200-200-20 MG/5ML PO SUSP
30.0000 mL | Freq: Four times a day (QID) | ORAL | Status: DC | PRN
  Administered 2021-07-13: 30 mL via ORAL
  Filled 2021-07-13: qty 30

## 2021-07-13 MED ORDER — HEPARIN SODIUM (PORCINE) 1000 UNIT/ML IJ SOLN
INTRAMUSCULAR | Status: AC
  Filled 2021-07-13: qty 10

## 2021-07-13 MED ORDER — LIDOCAINE HCL (PF) 1 % IJ SOLN
INTRAMUSCULAR | Status: DC | PRN
  Administered 2021-07-13: 2 mL

## 2021-07-13 MED ORDER — VERAPAMIL HCL 2.5 MG/ML IV SOLN
INTRAVENOUS | Status: DC | PRN
  Administered 2021-07-13 (×2): 10 mL via INTRA_ARTERIAL

## 2021-07-13 MED ORDER — SODIUM CHLORIDE 0.9 % IV SOLN: INTRAVENOUS | Status: AC

## 2021-07-13 MED ORDER — SODIUM CHLORIDE 0.9% FLUSH
3.0000 mL | Freq: Two times a day (BID) | INTRAVENOUS | Status: DC
  Administered 2021-07-13 – 2021-07-14 (×3): 3 mL via INTRAVENOUS

## 2021-07-13 MED ORDER — SODIUM CHLORIDE 0.9% FLUSH: 3.0000 mL | INTRAVENOUS | Status: DC | PRN

## 2021-07-13 MED ORDER — HEPARIN (PORCINE) 25000 UT/250ML-% IV SOLN
1300.0000 [IU]/h | INTRAVENOUS | Status: DC
  Administered 2021-07-13: 19:00:00 1250 [IU]/h via INTRAVENOUS
  Administered 2021-07-14: 1300 [IU]/h via INTRAVENOUS
  Filled 2021-07-13: qty 250

## 2021-07-13 MED ORDER — MIDAZOLAM HCL 2 MG/2ML IJ SOLN
INTRAMUSCULAR | Status: DC | PRN
  Administered 2021-07-13: 1 mg via INTRAVENOUS

## 2021-07-13 MED ORDER — ACETAMINOPHEN 325 MG PO TABS
650.0000 mg | ORAL_TABLET | ORAL | Status: DC | PRN
  Administered 2021-07-14: 650 mg via ORAL
  Filled 2021-07-13: qty 2

## 2021-07-13 MED ORDER — HEPARIN (PORCINE) IN NACL 1000-0.9 UT/500ML-% IV SOLN
INTRAVENOUS | Status: AC
  Filled 2021-07-13: qty 1000

## 2021-07-13 MED ORDER — VERAPAMIL HCL 2.5 MG/ML IV SOLN
INTRAVENOUS | Status: AC
  Filled 2021-07-13: qty 2

## 2021-07-13 MED ORDER — IOHEXOL 350 MG/ML SOLN
INTRAVENOUS | Status: DC | PRN
  Administered 2021-07-13: 150 mL

## 2021-07-13 MED ORDER — HEPARIN (PORCINE) IN NACL 1000-0.9 UT/500ML-% IV SOLN
INTRAVENOUS | Status: DC | PRN
  Administered 2021-07-13 (×2): 500 mL

## 2021-07-13 MED ORDER — FENTANYL CITRATE (PF) 100 MCG/2ML IJ SOLN
INTRAMUSCULAR | Status: AC
  Filled 2021-07-13: qty 2

## 2021-07-13 MED ORDER — HEPARIN SODIUM (PORCINE) 1000 UNIT/ML IJ SOLN
INTRAMUSCULAR | Status: DC | PRN
  Administered 2021-07-13: 5000 [IU] via INTRAVENOUS
  Administered 2021-07-13: 2000 [IU] via INTRAVENOUS

## 2021-07-13 MED ORDER — MIDAZOLAM HCL 2 MG/2ML IJ SOLN
INTRAMUSCULAR | Status: AC
  Filled 2021-07-13: qty 2

## 2021-07-13 MED ORDER — ONDANSETRON HCL 4 MG/2ML IJ SOLN
4.0000 mg | Freq: Four times a day (QID) | INTRAMUSCULAR | Status: DC | PRN
  Administered 2021-07-13: 4 mg via INTRAVENOUS
  Filled 2021-07-13: qty 2

## 2021-07-13 MED ORDER — SODIUM CHLORIDE 0.9 % IV SOLN: 250.0000 mL | INTRAVENOUS | Status: DC | PRN

## 2021-07-13 MED ORDER — ESCITALOPRAM OXALATE 10 MG PO TABS
10.0000 mg | ORAL_TABLET | Freq: Every day | ORAL | Status: DC
  Administered 2021-07-13 – 2021-07-14 (×2): 10 mg via ORAL
  Filled 2021-07-13 (×2): qty 1

## 2021-07-13 SURGICAL SUPPLY — 14 items
CATH 5FR JL3.5 JR4 ANG PIG MP (CATHETERS) ×1 IMPLANT
CATH INFINITI 5 FR AR1 MOD (CATHETERS) ×1 IMPLANT
CATH INFINITI 5FR JK (CATHETERS) ×1 IMPLANT
CATH LAUNCHER 5F EBU3.5 (CATHETERS) ×1 IMPLANT
DEVICE RAD COMP TR BAND LRG (VASCULAR PRODUCTS) ×1 IMPLANT
GLIDESHEATH SLEND SS 6F .021 (SHEATH) ×1 IMPLANT
GUIDEWIRE INQWIRE 1.5J.035X260 (WIRE) IMPLANT
INQWIRE 1.5J .035X260CM (WIRE) ×2
KIT HEART LEFT (KITS) ×2 IMPLANT
PACK CARDIAC CATHETERIZATION (CUSTOM PROCEDURE TRAY) ×2 IMPLANT
SHEATH 6FR 85 DEST SLENDER (SHEATH) ×1 IMPLANT
SYR MEDRAD MARK 7 150ML (SYRINGE) ×2 IMPLANT
TRANSDUCER W/STOPCOCK (MISCELLANEOUS) ×2 IMPLANT
TUBING CIL FLEX 10 FLL-RA (TUBING) ×2 IMPLANT

## 2021-07-13 NOTE — Progress Notes (Signed)
Cardiology Progress Note  Patient ID: Brian Sullivan MRN: [id number] DOB: Jan 16, 1946 Date of Encounter: 07/13/2021  Primary Cardiologist: Berniece Salines, DO  Subjective   Chief Complaint: None.   HPI: Denies chest pain.  No trouble breathing.  N.p.o. for left heart catheterization today.  ROS:  All other ROS reviewed and negative. Pertinent positives noted in the HPI.     Inpatient Medications  Scheduled Meds:  amitriptyline  25 mg Oral BID   [START ON 07/14/2021] aspirin  81 mg Oral Daily   carvedilol  6.25 mg Oral BID WC   ezetimibe  10 mg Oral Daily   isosorbide mononitrate  30 mg Oral Daily   levothyroxine  50 mcg Oral Q0600   losartan  100 mg Oral Daily   pravastatin  20 mg Oral Daily   sodium chloride flush  3 mL Intravenous Q12H   Continuous Infusions:  sodium chloride     sodium chloride 1 mL/kg/hr (07/13/21 0457)   heparin 1,250 Units/hr (07/13/21 0313)   PRN Meds: sodium chloride, sodium chloride flush   Vital Signs   Vitals:   07/12/21 2025 07/12/21 2345 07/13/21 0348 07/13/21 0732  BP: 106/77 114/80 122/66 (!) 148/86  Pulse: 85 71 68 73  Resp: 20 18 19 17   Temp: 98.4 F (36.9 C) 97.7 F (36.5 C) 98 F (36.7 C) 97.9 F (36.6 C)  TempSrc: Oral Oral Oral Oral  SpO2: 96% 100% 97% 98%  Weight:      Height:        Intake/Output Summary (Last 24 hours) at 07/13/2021 0922 Last data filed at 07/13/2021 0313 Gross per 24 hour  Intake 504.83 ml  Output 700 ml  Net -195.17 ml   Last 3 Weights 07/12/2021 07/12/2021 01/20/2021  Weight (lbs) 203 lb 203 lb 197 lb  Weight (kg) 92.08 kg 92.08 kg 89.359 kg      Telemetry  Overnight telemetry shows sinus rhythm in the 60s, which I personally reviewed.   Physical Exam   Vitals:   07/12/21 2025 07/12/21 2345 07/13/21 0348 07/13/21 0732  BP: 106/77 114/80 122/66 (!) 148/86  Pulse: 85 71 68 73  Resp: 20 18 19 17   Temp: 98.4 F (36.9 C) 97.7 F (36.5 C) 98 F (36.7 C) 97.9 F (36.6 C)  TempSrc: Oral Oral Oral Oral   SpO2: 96% 100% 97% 98%  Weight:      Height:        Intake/Output Summary (Last 24 hours) at 07/13/2021 0922 Last data filed at 07/13/2021 0313 Gross per 24 hour  Intake 504.83 ml  Output 700 ml  Net -195.17 ml    Last 3 Weights 07/12/2021 07/12/2021 01/20/2021  Weight (lbs) 203 lb 203 lb 197 lb  Weight (kg) 92.08 kg 92.08 kg 89.359 kg    Body mass index is 28.31 kg/m.  General: Well nourished, well developed, in no acute distress Head: Atraumatic, normal size  Eyes: PEERLA, EOMI  Neck: Supple, no JVD Endocrine: No thryomegaly Cardiac: Normal S1, S2; RRR; no murmurs, rubs, or gallops Lungs: Clear to auscultation bilaterally, no wheezing, rhonchi or rales  Abd: Soft, nontender, no hepatomegaly  Ext: No edema, pulses 2+ Musculoskeletal: No deformities, BUE and BLE strength normal and equal Skin: Warm and dry, no rashes   Neuro: Alert and oriented to person, place, time, and situation, CNII-XII grossly intact, no focal deficits  Psych: Normal mood and affect   Labs  High Sensitivity Troponin:   Recent Labs  Lab 07/11/21 1510 07/11/21 1703  07/11/21 2039  TROPONINIHS 7 9 7      Cardiac EnzymesNo results for input(s): TROPONINI in the last 168 hours. No results for input(s): TROPIPOC in the last 168 hours.  Chemistry Recent Labs  Lab 07/11/21 1510  NA 137  K 4.1  CL 104  CO2 24  GLUCOSE 91  BUN 13  CREATININE 1.17  CALCIUM 9.0  GFRNONAA >60  ANIONGAP 9    Hematology Recent Labs  Lab 07/11/21 1510 07/13/21 0515  WBC 8.7 8.3  RBC 5.06 4.72  HGB 15.3 14.6  HCT 46.9 43.7  MCV 92.7 92.6  MCH 30.2 30.9  MCHC 32.6 33.4  RDW 13.1 13.1  PLT 155 122*   BNPNo results for input(s): BNP, PROBNP in the last 168 hours.  DDimer No results for input(s): DDIMER in the last 168 hours.   Radiology  DG Chest 2 View  Result Date: 07/11/2021 CLINICAL DATA:  Sudden onset chest pain radiating to back EXAM: CHEST - 2 VIEW COMPARISON:  04/18/2020 FINDINGS: The heart size and  mediastinal contours are within normal limits. Both lungs are clear. The visualized skeletal structures are unremarkable. IMPRESSION: No active cardiopulmonary disease. Electronically Signed   By: Randa Ngo M.D.   On: 07/11/2021 15:23   ECHOCARDIOGRAM COMPLETE  Result Date: 07/12/2021    ECHOCARDIOGRAM REPORT   Patient Name:   Brian Sullivan Date of Exam: 07/12/2021 Medical Rec #:  [id number]    Height:       71.0 in Accession #:    [id number]   Weight:       203.0 lb Date of Birth:  [birth year]     BSA:          2.122 m Patient Age:    76 years     BP:           135/90 mmHg Patient Gender: M            HR:           68 bpm. Exam Location:  Inpatient Procedure: 2D Echo, Cardiac Doppler, Color Doppler and Intracardiac            Opacification Agent Indications:    R07.9* Chest pain, unspecified  History:        Patient has prior history of Echocardiogram examinations, most                 recent 04/09/2020. CAD, Abnormal ECG; Signs/Symptoms:Chest Pain.  Sonographer:    Glo Herring Referring Phys: [id number] [place]  1. Left ventricular ejection fraction, by estimation, is 50 to 55%. The left ventricle has low normal function. The left ventricle has no regional wall motion abnormalities. Left ventricular diastolic parameters are indeterminate.  2. Right ventricular systolic function is normal. The right ventricular size is normal.  3. The mitral valve is normal in structure. No evidence of mitral valve regurgitation. No evidence of mitral stenosis.  4. The aortic valve is thickened and moderately calcified. Unable to determine level of aortic stenosis, AV doppler not well assessed. Unable to perform planimetry due to poor visibility of the aortic valve leaflet tips. May need a repeat imaging for better interogation of the aortic valve if clinical picture depicts. The aortic valve is calcified. Aortic valve regurgitation is not visualized. No aortic stenosis is present.  5. Aortic dilatation  noted. There is mild dilatation of the aortic root, measuring 39 mm. There is borderline dilatation of the ascending aorta, measuring 37 mm.  6. The  inferior vena cava is normal in size with greater than 50% respiratory variability, suggesting right atrial pressure of 3 mmHg. FINDINGS  Left Ventricle: Left ventricular ejection fraction, by estimation, is 50 to 55%. The left ventricle has low normal function. The left ventricle has no regional wall motion abnormalities. The left ventricular internal cavity size was normal in size. There is no left ventricular hypertrophy. Left ventricular diastolic parameters are indeterminate. Right Ventricle: The right ventricular size is normal. No increase in right ventricular wall thickness. Right ventricular systolic function is normal. Left Atrium: Left atrial size was normal in size. Right Atrium: Right atrial size was normal in size. Pericardium: There is no evidence of pericardial effusion. Mitral Valve: The mitral valve is normal in structure. No evidence of mitral valve regurgitation. No evidence of mitral valve stenosis. MV peak gradient, 3.5 mmHg. The mean mitral valve gradient is 1.0 mmHg. Tricuspid Valve: The tricuspid valve is normal in structure. Tricuspid valve regurgitation is not demonstrated. No evidence of tricuspid stenosis. Aortic Valve: The aortic valve is thickened and moderately calcified. Unable to determine level of aortic stenosis, AV doppler not well assessed. Unable to perform planimetry due to poor visibility of the aortic valve leaflet tips. May need a repeat imaging for better interogation of the aortic valve if clinical picture depicts. The aortic valve is calcified. Aortic valve regurgitation is not visualized. No aortic stenosis is present. Aortic valve mean gradient measures 6.7 mmHg. Aortic valve peak gradient measures 12.5 mmHg. Pulmonic Valve: The pulmonic valve was normal in structure. Pulmonic valve regurgitation is not visualized. No  evidence of pulmonic stenosis. Aorta: Aortic dilatation noted. There is mild dilatation of the aortic root, measuring 39 mm. There is borderline dilatation of the ascending aorta, measuring 37 mm. Venous: The inferior vena cava is normal in size with greater than 50% respiratory variability, suggesting right atrial pressure of 3 mmHg. IAS/Shunts: No atrial level shunt detected by color flow Doppler.   Diastology LV e' medial:    4.65 cm/s LV E/e' medial:  14.5 LV e' lateral:   5.48 cm/s LV E/e' lateral: 12.3  RIGHT VENTRICLE RV Basal diam:  4.00 cm RV S prime:     13.70 cm/s LEFT ATRIUM           Index        RIGHT ATRIUM           Index LA Vol (A2C): 50.0 ml 23.56 ml/m  RA Area:     19.30 cm LA Vol (A4C): 42.0 ml 19.79 ml/m  RA Volume:   61.70 ml  29.08 ml/m  AORTIC VALVE AV Vmax:           176.67 cm/s AV Vmean:          122.333 cm/s AV VTI:            0.341 m AV Peak Grad:      12.5 mmHg AV Mean Grad:      6.7 mmHg LVOT Vmax:         63.15 cm/s LVOT Vmean:        45.250 cm/s LVOT VTI:          0.133 m LVOT/AV VTI ratio: 0.39  AORTA Ao Root diam: 3.90 cm Ao Asc diam:  3.70 cm MITRAL VALVE MV Area (PHT): 3.33 cm    SHUNTS MV Peak grad:  3.5 mmHg    Systemic VTI: 0.13 m MV Mean grad:  1.0 mmHg MV Vmax:  0.94 m/s MV Vmean:      50.0 cm/s MV Decel Time: 228 msec MV E velocity: 67.30 cm/s MV A velocity: 93.00 cm/s MV E/A ratio:  0.72 Kardie Tobb DO Electronically signed by Berniece Salines DO Signature Date/Time: 07/12/2021/2:24:59 PM    Final     Cardiac Studies  TTE 07/12/2021  1. Left ventricular ejection fraction, by estimation, is 50 to 55%. The  left ventricle has low normal function. The left ventricle has no regional  wall motion abnormalities. Left ventricular diastolic parameters are  indeterminate.   2. Right ventricular systolic function is normal. The right ventricular  size is normal.   3. The mitral valve is normal in structure. No evidence of mitral valve  regurgitation. No evidence of mitral  stenosis.   4. The aortic valve is thickened and moderately calcified. Unable to  determine level of aortic stenosis, AV doppler not well assessed. Unable  to perform planimetry due to poor visibility of the aortic valve leaflet  tips. May need a repeat imaging for  better interogation of the aortic valve if clinical picture depicts. The  aortic valve is calcified. Aortic valve regurgitation is not visualized.  No aortic stenosis is present.   5. Aortic dilatation noted. There is mild dilatation of the aortic root,  measuring 39 mm. There is borderline dilatation of the ascending aorta,  measuring 37 mm.   6. The inferior vena cava is normal in size with greater than 50%  respiratory variability, suggesting right atrial pressure of 3 mmHg.   Patient Profile  Nour Scalise is a 76 y.o. male with CAD status post RCA PCI in 2003, GERD, hypertension, hypothyroidism, hyperlipidemia who was admitted on 07/11/2021 for unstable angina.  Assessment & Plan   #Unstable angina -Has had 2 recurrent episodes of alarming chest pressure.  Troponins negative.  EKG with nonspecific ST-T changes.  Echo shows no wall motion normalities with preserved LV function. -On heparin drip. -On aspirin and Zetia.  Challenged on pravastatin 20 mg daily.  Needs PCSK9 inhibitor therapy as an outpatient. -N.p.o. for left heart catheterization today.  Symptoms are highly concerning for unstable angina. -Transition from propranolol to Coreg 12.5 mg twice daily. -Imdur 30 mg daily added yesterday as well.  #Hypertension -Change propranolol to Coreg.  Can transition metipranolol if no PCI was performed. -Continue losartan 100 mg daily. -Imdur 30 mg daily added.  #HLD -Continue Zetia.  Rechallenged on pravastatin 20 mg daily.  Outpatient consideration for PCSK9 inhibitor.  #Hypothyroidism -home synthroid  FEN -pre cath IVF -diet: NPO -dvt ppx: heparin drip -code: full   For questions or updates, please contact  [place] Please consult [email or web address] for contact info under    Time Spent with Patient: I have spent a total of 35 minutes with patient reviewing hospital notes, telemetry, EKGs, labs and examining the patient as well as establishing an assessment and plan that was discussed with the patient.  > 50% of time was spent in direct patient care.    Signed, Addison Naegeli. Audie Box, MD, [place]  07/13/2021 9:22 AM

## 2021-07-13 NOTE — H&P (View-Only) (Signed)
Cardiology Progress Note  Patient ID: Brian Sullivan MRN: [id number] DOB: March 27, 1946 Date of Encounter: 07/13/2021  Primary Cardiologist: Berniece Salines, DO  Subjective   Chief Complaint: None.   HPI: Denies chest pain.  No trouble breathing.  N.p.o. for left heart catheterization today.  ROS:  All other ROS reviewed and negative. Pertinent positives noted in the HPI.     Inpatient Medications  Scheduled Meds:  amitriptyline  25 mg Oral BID   [START ON 07/14/2021] aspirin  81 mg Oral Daily   carvedilol  6.25 mg Oral BID WC   ezetimibe  10 mg Oral Daily   isosorbide mononitrate  30 mg Oral Daily   levothyroxine  50 mcg Oral Q0600   losartan  100 mg Oral Daily   pravastatin  20 mg Oral Daily   sodium chloride flush  3 mL Intravenous Q12H   Continuous Infusions:  sodium chloride     sodium chloride 1 mL/kg/hr (07/13/21 0457)   heparin 1,250 Units/hr (07/13/21 0313)   PRN Meds: sodium chloride, sodium chloride flush   Vital Signs   Vitals:   07/12/21 2025 07/12/21 2345 07/13/21 0348 07/13/21 0732  BP: 106/77 114/80 122/66 (!) 148/86  Pulse: 85 71 68 73  Resp: 20 18 19 17   Temp: 98.4 F (36.9 C) 97.7 F (36.5 C) 98 F (36.7 C) 97.9 F (36.6 C)  TempSrc: Oral Oral Oral Oral  SpO2: 96% 100% 97% 98%  Weight:      Height:        Intake/Output Summary (Last 24 hours) at 07/13/2021 0922 Last data filed at 07/13/2021 0313 Gross per 24 hour  Intake 504.83 ml  Output 700 ml  Net -195.17 ml   Last 3 Weights 07/12/2021 07/12/2021 01/20/2021  Weight (lbs) 203 lb 203 lb 197 lb  Weight (kg) 92.08 kg 92.08 kg 89.359 kg      Telemetry  Overnight telemetry shows sinus rhythm in the 60s, which I personally reviewed.   Physical Exam   Vitals:   07/12/21 2025 07/12/21 2345 07/13/21 0348 07/13/21 0732  BP: 106/77 114/80 122/66 (!) 148/86  Pulse: 85 71 68 73  Resp: 20 18 19 17   Temp: 98.4 F (36.9 C) 97.7 F (36.5 C) 98 F (36.7 C) 97.9 F (36.6 C)  TempSrc: Oral Oral Oral Oral   SpO2: 96% 100% 97% 98%  Weight:      Height:        Intake/Output Summary (Last 24 hours) at 07/13/2021 0922 Last data filed at 07/13/2021 0313 Gross per 24 hour  Intake 504.83 ml  Output 700 ml  Net -195.17 ml    Last 3 Weights 07/12/2021 07/12/2021 01/20/2021  Weight (lbs) 203 lb 203 lb 197 lb  Weight (kg) 92.08 kg 92.08 kg 89.359 kg    Body mass index is 28.31 kg/m.  General: Well nourished, well developed, in no acute distress Head: Atraumatic, normal size  Eyes: PEERLA, EOMI  Neck: Supple, no JVD Endocrine: No thryomegaly Cardiac: Normal S1, S2; RRR; no murmurs, rubs, or gallops Lungs: Clear to auscultation bilaterally, no wheezing, rhonchi or rales  Abd: Soft, nontender, no hepatomegaly  Ext: No edema, pulses 2+ Musculoskeletal: No deformities, BUE and BLE strength normal and equal Skin: Warm and dry, no rashes   Neuro: Alert and oriented to person, place, time, and situation, CNII-XII grossly intact, no focal deficits  Psych: Normal mood and affect   Labs  High Sensitivity Troponin:   Recent Labs  Lab 07/11/21 1510 07/11/21 1703  07/11/21 2039  TROPONINIHS 7 9 7      Cardiac EnzymesNo results for input(s): TROPONINI in the last 168 hours. No results for input(s): TROPIPOC in the last 168 hours.  Chemistry Recent Labs  Lab 07/11/21 1510  NA 137  K 4.1  CL 104  CO2 24  GLUCOSE 91  BUN 13  CREATININE 1.17  CALCIUM 9.0  GFRNONAA >60  ANIONGAP 9    Hematology Recent Labs  Lab 07/11/21 1510 07/13/21 0515  WBC 8.7 8.3  RBC 5.06 4.72  HGB 15.3 14.6  HCT 46.9 43.7  MCV 92.7 92.6  MCH 30.2 30.9  MCHC 32.6 33.4  RDW 13.1 13.1  PLT 155 122*   BNPNo results for input(s): BNP, PROBNP in the last 168 hours.  DDimer No results for input(s): DDIMER in the last 168 hours.   Radiology  DG Chest 2 View  Result Date: 07/11/2021 CLINICAL DATA:  Sudden onset chest pain radiating to back EXAM: CHEST - 2 VIEW COMPARISON:  04/18/2020 FINDINGS: The heart size and  mediastinal contours are within normal limits. Both lungs are clear. The visualized skeletal structures are unremarkable. IMPRESSION: No active cardiopulmonary disease. Electronically Signed   By: Randa Ngo M.D.   On: 07/11/2021 15:23   ECHOCARDIOGRAM COMPLETE  Result Date: 07/12/2021    ECHOCARDIOGRAM REPORT   Patient Name:   Brian Sullivan Date of Exam: 07/12/2021 Medical Rec #:  [id number]    Height:       71.0 in Accession #:    [id number]   Weight:       203.0 lb Date of Birth:  [birth year]     BSA:          2.122 m Patient Age:    76 years     BP:           135/90 mmHg Patient Gender: M            HR:           68 bpm. Exam Location:  Inpatient Procedure: 2D Echo, Cardiac Doppler, Color Doppler and Intracardiac            Opacification Agent Indications:    R07.9* Chest pain, unspecified  History:        Patient has prior history of Echocardiogram examinations, most                 recent 04/09/2020. CAD, Abnormal ECG; Signs/Symptoms:Chest Pain.  Sonographer:    Glo Herring Referring Phys: [id number] [place]  1. Left ventricular ejection fraction, by estimation, is 50 to 55%. The left ventricle has low normal function. The left ventricle has no regional wall motion abnormalities. Left ventricular diastolic parameters are indeterminate.  2. Right ventricular systolic function is normal. The right ventricular size is normal.  3. The mitral valve is normal in structure. No evidence of mitral valve regurgitation. No evidence of mitral stenosis.  4. The aortic valve is thickened and moderately calcified. Unable to determine level of aortic stenosis, AV doppler not well assessed. Unable to perform planimetry due to poor visibility of the aortic valve leaflet tips. May need a repeat imaging for better interogation of the aortic valve if clinical picture depicts. The aortic valve is calcified. Aortic valve regurgitation is not visualized. No aortic stenosis is present.  5. Aortic dilatation  noted. There is mild dilatation of the aortic root, measuring 39 mm. There is borderline dilatation of the ascending aorta, measuring 37 mm.  6. The  inferior vena cava is normal in size with greater than 50% respiratory variability, suggesting right atrial pressure of 3 mmHg. FINDINGS  Left Ventricle: Left ventricular ejection fraction, by estimation, is 50 to 55%. The left ventricle has low normal function. The left ventricle has no regional wall motion abnormalities. The left ventricular internal cavity size was normal in size. There is no left ventricular hypertrophy. Left ventricular diastolic parameters are indeterminate. Right Ventricle: The right ventricular size is normal. No increase in right ventricular wall thickness. Right ventricular systolic function is normal. Left Atrium: Left atrial size was normal in size. Right Atrium: Right atrial size was normal in size. Pericardium: There is no evidence of pericardial effusion. Mitral Valve: The mitral valve is normal in structure. No evidence of mitral valve regurgitation. No evidence of mitral valve stenosis. MV peak gradient, 3.5 mmHg. The mean mitral valve gradient is 1.0 mmHg. Tricuspid Valve: The tricuspid valve is normal in structure. Tricuspid valve regurgitation is not demonstrated. No evidence of tricuspid stenosis. Aortic Valve: The aortic valve is thickened and moderately calcified. Unable to determine level of aortic stenosis, AV doppler not well assessed. Unable to perform planimetry due to poor visibility of the aortic valve leaflet tips. May need a repeat imaging for better interogation of the aortic valve if clinical picture depicts. The aortic valve is calcified. Aortic valve regurgitation is not visualized. No aortic stenosis is present. Aortic valve mean gradient measures 6.7 mmHg. Aortic valve peak gradient measures 12.5 mmHg. Pulmonic Valve: The pulmonic valve was normal in structure. Pulmonic valve regurgitation is not visualized. No  evidence of pulmonic stenosis. Aorta: Aortic dilatation noted. There is mild dilatation of the aortic root, measuring 39 mm. There is borderline dilatation of the ascending aorta, measuring 37 mm. Venous: The inferior vena cava is normal in size with greater than 50% respiratory variability, suggesting right atrial pressure of 3 mmHg. IAS/Shunts: No atrial level shunt detected by color flow Doppler.   Diastology LV e' medial:    4.65 cm/s LV E/e' medial:  14.5 LV e' lateral:   5.48 cm/s LV E/e' lateral: 12.3  RIGHT VENTRICLE RV Basal diam:  4.00 cm RV S prime:     13.70 cm/s LEFT ATRIUM           Index        RIGHT ATRIUM           Index LA Vol (A2C): 50.0 ml 23.56 ml/m  RA Area:     19.30 cm LA Vol (A4C): 42.0 ml 19.79 ml/m  RA Volume:   61.70 ml  29.08 ml/m  AORTIC VALVE AV Vmax:           176.67 cm/s AV Vmean:          122.333 cm/s AV VTI:            0.341 m AV Peak Grad:      12.5 mmHg AV Mean Grad:      6.7 mmHg LVOT Vmax:         63.15 cm/s LVOT Vmean:        45.250 cm/s LVOT VTI:          0.133 m LVOT/AV VTI ratio: 0.39  AORTA Ao Root diam: 3.90 cm Ao Asc diam:  3.70 cm MITRAL VALVE MV Area (PHT): 3.33 cm    SHUNTS MV Peak grad:  3.5 mmHg    Systemic VTI: 0.13 m MV Mean grad:  1.0 mmHg MV Vmax:  0.94 m/s MV Vmean:      50.0 cm/s MV Decel Time: 228 msec MV E velocity: 67.30 cm/s MV A velocity: 93.00 cm/s MV E/A ratio:  0.72 Kardie Tobb DO Electronically signed by Berniece Salines DO Signature Date/Time: 07/12/2021/2:24:59 PM    Final     Cardiac Studies  TTE 07/12/2021  1. Left ventricular ejection fraction, by estimation, is 50 to 55%. The  left ventricle has low normal function. The left ventricle has no regional  wall motion abnormalities. Left ventricular diastolic parameters are  indeterminate.   2. Right ventricular systolic function is normal. The right ventricular  size is normal.   3. The mitral valve is normal in structure. No evidence of mitral valve  regurgitation. No evidence of mitral  stenosis.   4. The aortic valve is thickened and moderately calcified. Unable to  determine level of aortic stenosis, AV doppler not well assessed. Unable  to perform planimetry due to poor visibility of the aortic valve leaflet  tips. May need a repeat imaging for  better interogation of the aortic valve if clinical picture depicts. The  aortic valve is calcified. Aortic valve regurgitation is not visualized.  No aortic stenosis is present.   5. Aortic dilatation noted. There is mild dilatation of the aortic root,  measuring 39 mm. There is borderline dilatation of the ascending aorta,  measuring 37 mm.   6. The inferior vena cava is normal in size with greater than 50%  respiratory variability, suggesting right atrial pressure of 3 mmHg.   Patient Profile  Brian Sullivan is a 76 y.o. male with CAD status post RCA PCI in 2003, GERD, hypertension, hypothyroidism, hyperlipidemia who was admitted on 07/11/2021 for unstable angina.  Assessment & Plan   #Unstable angina -Has had 2 recurrent episodes of alarming chest pressure.  Troponins negative.  EKG with nonspecific ST-T changes.  Echo shows no wall motion normalities with preserved LV function. -On heparin drip. -On aspirin and Zetia.  Challenged on pravastatin 20 mg daily.  Needs PCSK9 inhibitor therapy as an outpatient. -N.p.o. for left heart catheterization today.  Symptoms are highly concerning for unstable angina. -Transition from propranolol to Coreg 12.5 mg twice daily. -Imdur 30 mg daily added yesterday as well.  #Hypertension -Change propranolol to Coreg.  Can transition metipranolol if no PCI was performed. -Continue losartan 100 mg daily. -Imdur 30 mg daily added.  #HLD -Continue Zetia.  Rechallenged on pravastatin 20 mg daily.  Outpatient consideration for PCSK9 inhibitor.  #Hypothyroidism -home synthroid  FEN -pre cath IVF -diet: NPO -dvt ppx: heparin drip -code: full   For questions or updates, please contact  [place] Please consult [email or web address] for contact info under    Time Spent with Patient: I have spent a total of 35 minutes with patient reviewing hospital notes, telemetry, EKGs, labs and examining the patient as well as establishing an assessment and plan that was discussed with the patient.  > 50% of time was spent in direct patient care.    Signed, Addison Naegeli. Audie Box, MD, [place]  07/13/2021 9:22 AM

## 2021-07-13 NOTE — Consult Note (Addendum)
[name]       [place] [address]             [phone]        [name] Medical Record #[id number] Date of Birth: 22-Dec-1945  Referring: Wellington Hampshire, MD Primary Care: Nicoletta Dress, MD Primary Cardiologist:Kardie Tobb, DO  Chief Complaint:   Chest pain   History of Present Illness:   Brian Sullivan is a very pleasant 76 year old male with history of coronary artery disease, status post PTCI of the distal right coronary artery with a bare-metal stent in 2003.  He also has a past history significant for hypertension, dyslipidemia, cerebral palsy, and a 4 pack-year history of tobacco use.  After being completely free of angina since 2003, he developed chest pain while walking around in a department store 2 days ago.  His wife, who is a Designer, jewellery, called EMS.  Brian Sullivan was given aspirin and morphine by the EMS team with complete resolution of the chest pain.  He was transported to the [facility] emergency department.  Serial troponins ranged from 7-9.  EKG showed normal sinus rhythm with no acute ischemic changes.  Chest x-ray showed no acute abnormality.  He had another episode of chest pain while in the emergency room that resolved without any intervention.  Cardiology service was consulted and Brian Sullivan was admitted to the hospital for further evaluation. Brian Sullivan was started on a heparin infusion and oral nitrate.  He had no further chest pain.  Left heart catheterization was recommended by the cardiology team and carried out earlier today.  This demonstrates three-vessel coronary artery disease with a 90% stenosis of the proximal right coronary artery followed by a second 90% stenosis near the mid right coronary artery.  The bare-metal stent in the distal RCA appears patent.  There was a 90% mid circumflex stenosis along with an 80% stenosis in the proximal OM 2.  The LAD had a proximal 50% stenosis followed by an 85% lesion at the  origin of the vessel.  There was mild aortic stenosis with a peak gradient of 16 mmHg.  Left ventricular ejection fraction was estimated at 50 to 55%. CT surgery has been asked to evaluate Brian Sullivan for consideration of coronary bypass grafting. 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  He remains active, volunteering at the hospital in [place].  He has regular dental checkups every 6 months and was last seen by his dentist 3 to 4 months ago.  He has no active dental problems.  He has a history of cerebral palsy manifested by diminished size of both the left upper and lower extremity compared with the right.  He had a fracture of his left hip after a fall about 15 months ago resulting in a total hip replacement.  Since then, he has had chronic swelling of his left lower extremity and prefers that we harvested vein from his right lower extremity if possible.  Current Activity/ Functional Status: Patient is independent with mobility/ambulation, transfers, ADL's, IADL's.   Zubrod Score: At the time of surgery this patients most appropriate activity status/level should be described as: []     0    Normal activity, no symptoms     [x]    1    Restricted in physical strenuous activity but ambulatory, able to do out light work []     2    Ambulatory and capable of  self care, unable to do work activities, up and about                 more than 50%  Of the time                            []     3    Only limited self care, in bed greater than 50% of waking hours []     4    Completely disabled, no self care, confined to bed or chair []     5    Moribund  Past Medical History:  Diagnosis Date   Anxiety 04/05/2020   Balance problem 04/05/2020   Benign hypertensive heart disease without heart failure 04/05/2020   Benign non-nodular prostatic hyperplasia 04/05/2020   CAD (coronary artery disease) 04/05/2020   Cerebral palsy ([place])    Esophageal dysphagia    Gastro-esophageal reflux 04/05/2020    H. 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 Small hiatal hernia. Mild gastritis   HIP SURGERY Left 04/2020   KNEE SURGERY Right 04/25/2019   1.Partial medial meniscectomy 2.Chondroplasty medical femoral   LAPAROSCOPIC CHOLECYSTECTOMY  02/19/2004   Done at [facility], [place]    LEFT HEART CATH AND CORONARY ANGIOGRAPHY N/A 07/13/2021   Procedure: LEFT HEART CATH AND CORONARY ANGIOGRAPHY;  Surgeon: Wellington Hampshire, MD;  Location: [facility];  Service: Cardiovascular;  Laterality: N/A;   PROSTATE SURGERY  06/2008   TENDON REPAIR Right 06/09/2011   Open right distal biceps tendon repair    Social History   Tobacco Use  Smoking Status Former   Types: Cigarettes   Quit date: 04/05/1970   Years since quitting: 51.3  Smokeless Tobacco Never    Social History   Substance and Sexual Activity  Alcohol Use Yes   Alcohol/week: 0.0 standard drinks   Comment: occas.     Allergies  Allergen Reactions   Gemfibrozil Other (See Comments)   Paroxetine Hcl Itching   Statins Other (See Comments)    Leg pain   Sulfamethoxazole Other (See Comments)    Nausea, leg pain     Current Facility-Administered Medications  Medication Dose Route Frequency Provider Last Rate Last Admin   0.9 %   sodium chloride infusion   Intravenous Continuous Kathlyn Sacramento A, MD 100 mL/hr at 07/13/21 1154 New Bag at 07/13/21 1154   0.9 %  sodium chloride infusion  250 mL Intravenous PRN Wellington Hampshire, MD       acetaminophen (TYLENOL) tablet 650 mg  650 mg Oral Q4H PRN Wellington Hampshire, MD       amitriptyline (ELAVIL) tablet 25 mg  25 mg Oral BID Wellington Hampshire, MD   25 mg at 07/13/21 0931   [  START ON 07/14/2021] aspirin chewable tablet 81 mg  81 mg Oral Daily Kathlyn Sacramento A, MD       carvedilol (COREG) tablet 6.25 mg  6.25 mg Oral BID WC Kathlyn Sacramento A, MD   6.25 mg at 07/13/21 0932   ezetimibe (ZETIA) tablet 10 mg  10 mg Oral Daily Wellington Hampshire, MD   10 mg at 07/13/21 0931   heparin ADULT infusion 100 units/mL (25000 units/23mL)  1,250 Units/hr Intravenous Continuous Reome, Earle J, RPH       isosorbide mononitrate (IMDUR) 24 hr tablet 30 mg  30 mg Oral Daily Kathlyn Sacramento A, MD   30 mg at 07/13/21 0932   levothyroxine (SYNTHROID) tablet 50 mcg  50 mcg Oral Q0600 Wellington Hampshire, MD   50 mcg at 07/13/21 0533   losartan (COZAAR) tablet 100 mg  100 mg Oral Daily Kathlyn Sacramento A, MD   100 mg at 07/12/21 0934   ondansetron (ZOFRAN) injection 4 mg  4 mg Intravenous Q6H PRN Wellington Hampshire, MD       pravastatin (PRAVACHOL) tablet 20 mg  20 mg Oral Daily Kathlyn Sacramento A, MD   20 mg at 07/13/21 0931   sodium chloride flush (NS) 0.9 % injection 3 mL  3 mL Intravenous Q12H Kathlyn Sacramento A, MD       sodium chloride flush (NS) 0.9 % injection 3 mL  3 mL Intravenous PRN Wellington Hampshire, MD        Medications Prior to Admission  Medication Sig Dispense Refill Last Dose   amitriptyline (ELAVIL) 25 MG tablet Take 25 mg by mouth in the morning and at bedtime.   07/11/2021   diclofenac (VOLTAREN) 75 MG EC tablet Take 75 mg by mouth 2 (two) times daily as needed for moderate pain.   07/11/2021   esomeprazole (NEXIUM) 40 MG capsule Take 40 mg by mouth 2 (two) times daily before a meal.     07/11/2021   levothyroxine (SYNTHROID) 50 MCG tablet Take 50 mcg by mouth daily.   07/11/2021   losartan (COZAAR) 100 MG tablet Take 100 mg by mouth daily.   Past Week   pravastatin (PRAVACHOL) 20 MG tablet Take 20 mg by mouth daily.   Past Week   propranolol (INDERAL) 10 MG tablet TAKE 1 TO 2 TABLETS ONCE DAILY AS NEEDED FOR TENSION HEADACHE. (Patient taking differently: Take 10 mg by mouth 2 (two) times daily.) 60 tablet 3 07/11/2021 at 0730   tadalafil (CIALIS) 5 MG tablet Take 5 mg by mouth daily as needed for erectile dysfunction.   Past Week   Vitamin D, Ergocalciferol, (DRISDOL) 1.25 MG (50000 UT) CAPS capsule Take 50,000 Units by mouth every 7 (seven) days.   07/06/2021   losartan (COZAAR) 50 MG tablet Take 50 mg by mouth daily. (Patient not taking: Reported on 07/12/2021)   Not Taking    Family History  Problem Relation Age of Onset   COPD Mother    Emphysema Mother    CAD Mother    Hypertension Mother    Headache Mother    Heart attack Father    CAD Father    Hypertension Father    Esophageal cancer Brother    Colon cancer Neg Hx    Pancreatic cancer Neg Hx    Stomach cancer Neg Hx      Review of Systems:   ROS    Cardiac Review of Systems: Y or  [    ]= no  Chest Pain [  x  ]  Resting SOB [   ] Exertional SOB  [  ]  Orthopnea [  ]   Pedal Edema [ x  ]    Palpitations [  ] Syncope  [  ]   Presyncope [   ]  General Review of Systems: [Y] = yes [  ]=no Constitional: recent weight change [  ]; anorexia [  ]; fatigue [  ]; nausea [  ]; night sweats [  ]; fever [  ]; or chills [  ]                                                               Dental: Last Dentist visit: 3-4 months ago  Eye : blurred vision [  ]; diplopia [   ]; vision changes [  ];  Amaurosis fugax[  ]; Resp: cough [  ];  wheezing[  ];  hemoptysis[  ]; shortness of breath[  ]; paroxysmal nocturnal dyspnea[  ]; dyspnea on exertion[  ]; or orthopnea[  ];  GI:  gallstones[  ], vomiting[  ];  dysphagia[  ]; melena[   ];  hematochezia [  ]; heartburn[  ];   Hx of  Colonoscopy[  ]; GU: kidney stones [  ]; hematuria[  ];   dysuria [  ];  nocturia[  ];  history of     obstruction [  ]; urinary frequency [  ]             Skin: rash, swelling[  ];, hair loss[  ];  peripheral edema[ x (left leg) ];  or itching[  ]; Musculosketetal: myalgias[  ];  joint swelling[  ];  joint erythema[  ];  joint pain[  ];  back pain[  ];  Heme/Lymph: bruising[  ];  bleeding[  ];  anemia[  ];  Neuro: TIA[  ];  headaches[  ];  stroke[  ];  vertigo[  ];  seizures[  ];   paresthesias[  ];  difficulty walking[  ];  Psych:depression[  ]; anxiety[  ];  Endocrine: diabetes[  ];  thyroid dysfunction[  ];                 Physical Exam: BP (!) 153/91 (BP Location: Left Arm)    Pulse 84    Temp 97.7 F (36.5 C) (Oral)    Resp 20    Ht 5\' 11"  (1.803 m)    Wt 92.1 kg    SpO2 97%    BMI 28.31 kg/m    General appearance: alert, cooperative, and no distress Head: Normocephalic, without obvious abnormality, atraumatic Neck: no adenopathy, no carotid bruit, no JVD, and supple, symmetrical, trachea midline Lymph nodes: No cervical or clavicular adenopathy Resp: clear to auscultation bilaterally Cardio: Regular rate and rhythm, monitor shows normal sinus rhythm with occasional PAC GI: Soft, nontender, active bowel sounds Extremities: There is a TR band over the right radial artery.  The right hand is warm and well-perfused.  The left arm and left leg are noticeably decreased in diameter.  With the right extremities but no other significant deformity.  Distal pulses are palpable in all extremities. Neurologic: Grossly normal  Diagnostic Studies & Laboratory data:     Recent Radiology Findings:  DG Chest 2 View  Result Date: 07/11/2021 CLINICAL DATA:  Sudden onset chest pain radiating to back EXAM: CHEST - 2 VIEW COMPARISON:  04/18/2020 FINDINGS: The heart size and mediastinal contours are within normal limits. Both lungs are clear. The  visualized skeletal structures are unremarkable. IMPRESSION: No active cardiopulmonary disease. Electronically Signed   By: Randa Ngo M.D.   On: 07/11/2021 15:23   CARDIAC CATHETERIZATION  LEFT HEART CATH AND CORONARY ANGIOGRAPHY   Conclusion      Prox RCA lesion is 90% stenosed.   Prox RCA to Mid RCA lesion is 90% stenosed.   Dist RCA lesion is 30% stenosed.   Prox Cx lesion is 40% stenosed.   2nd Mrg lesion is 80% stenosed.   Mid Cx lesion is 90% stenosed.   Mid LAD lesion is 85% stenosed.   Ost LAD to Prox LAD lesion is 50% stenosed.   1.  Severe heavily calcified three-vessel coronary artery disease. 2.  Left ventricular angiography was not performed.  EF was 50 to 55% by echo.  Normal left ventricular end-diastolic pressure at 6 mmHg. 3.  Mild aortic stenosis with peak to peak gradient of 16 mmHg. 3.  Very difficult procedure via the right radial artery due to tortuosity of the innominate artery and difficult engagement of the coronary arteries in spite of using a long sheath.   Recommendations: Given diffuse calcified three-vessel coronary artery disease, recommend evaluation for CABG. Resume heparin at 6 PM.  Coronary Findings  Diagnostic Dominance: Right Left Main  The vessel exhibits minimal luminal irregularities.    Left Anterior Descending  Ost LAD to Prox LAD lesion is 50% stenosed.  Mid LAD lesion is 85% stenosed.    First Diagonal Branch  Vessel is small in size.    Left Circumflex  Prox Cx lesion is 40% stenosed.  Mid Cx lesion is 90% stenosed.    Second Obtuse Marginal Branch  2nd Mrg lesion is 80% stenosed.    Right Coronary Artery  Vessel is large.  Prox RCA lesion is 90% stenosed. The lesion is severely calcified.  Prox RCA to Mid RCA lesion is 90% stenosed. The lesion is severely calcified.  Dist RCA lesion is 30% stenosed. The lesion was previously treated .    Right Posterior Descending Artery  The vessel exhibits minimal luminal  irregularities.    Right Posterior Atrioventricular Artery  Vessel is large in size.    First Right Posterolateral Branch  The vessel exhibits minimal luminal irregularities.    Second Right Posterolateral Branch  The vessel exhibits minimal luminal irregularities.    Third Right Posterolateral Branch  Vessel is large in size.    Intervention   No interventions have been documented.   Coronary Diagrams  Diagnostic Dominance: Right    ECHOCARDIOGRAM COMPLETE  ECHOCARDIOGRAM REPORT         Patient Name:   Sarim Marya Amsler Date of Exam: 07/12/2021  Medical Rec #:  [id number]    Height:       71.0 in  Accession #:    [id number]   Weight:       203.0 lb  Date of Birth:  Jul 29, 1945     BSA:          2.122 m  Patient Age:    57 years     BP:           135/90 mmHg  Patient Gender: M            HR:  68 bpm.  Exam Location:  Inpatient   Procedure: 2D Echo, Cardiac Doppler, Color Doppler and Intracardiac             Opacification Agent   Indications:    R07.9* Chest pain, unspecified     History:        Patient has prior history of Echocardiogram examinations,  most                  recent 04/09/2020. CAD, Abnormal ECG; Signs/Symptoms:Chest  Pain.     Sonographer:    Glo Herring  Referring Phys: [id number] [place]     1. Left ventricular ejection fraction, by estimation, is 50 to 55%. The  left ventricle has low normal function. The left ventricle has no regional  wall motion abnormalities. Left ventricular diastolic parameters are  indeterminate.   2. Right ventricular systolic function is normal. The right ventricular  size is normal.   3. The mitral valve is normal in structure. No evidence of mitral valve  regurgitation. No evidence of mitral stenosis.   4. The aortic valve is thickened and moderately calcified. Unable to  determine level of aortic stenosis, AV doppler not well assessed. Unable  to perform planimetry due to poor  visibility of the aortic valve leaflet  tips. May need a repeat imaging for  better interogation of the aortic valve if clinical picture depicts. The  aortic valve is calcified. Aortic valve regurgitation is not visualized.  No aortic stenosis is present.   5. Aortic dilatation noted. There is mild dilatation of the aortic root,  measuring 39 mm. There is borderline dilatation of the ascending aorta,  measuring 37 mm.   6. The inferior vena cava is normal in size with greater than 50%  respiratory variability, suggesting right atrial pressure of 3 mmHg.   FINDINGS   Left Ventricle: Left ventricular ejection fraction, by estimation, is 50  to 55%. The left ventricle has low normal function. The left ventricle has  no regional wall motion abnormalities. The left ventricular internal  cavity size was normal in size.  There is no left ventricular hypertrophy. Left ventricular diastolic  parameters are indeterminate.   Right Ventricle: The right ventricular size is normal. No increase in  right ventricular wall thickness. Right ventricular systolic function is  normal.   Left Atrium: Left atrial size was normal in size.   Right Atrium: Right atrial size was normal in size.   Pericardium: There is no evidence of pericardial effusion.   Mitral Valve: The mitral valve is normal in structure. No evidence of  mitral valve regurgitation. No evidence of mitral valve stenosis. MV peak  gradient, 3.5 mmHg. The mean mitral valve gradient is 1.0 mmHg.   Tricuspid Valve: The tricuspid valve is normal in structure. Tricuspid  valve regurgitation is not demonstrated. No evidence of tricuspid  stenosis.   Aortic Valve: The aortic valve is thickened and moderately calcified.  Unable to determine level of aortic stenosis, AV doppler not well  assessed. Unable to perform planimetry due to poor visibility of the  aortic valve leaflet tips. May need a repeat  imaging for better interogation of the  aortic valve if clinical picture  depicts. The aortic valve is calcified. Aortic valve regurgitation is not  visualized. No aortic stenosis is present. Aortic valve mean gradient  measures 6.7 mmHg. Aortic valve peak  gradient measures 12.5 mmHg.   Pulmonic Valve: The pulmonic valve was normal in structure.  Pulmonic valve  regurgitation is not visualized. No evidence of pulmonic stenosis.   Aorta: Aortic dilatation noted. There is mild dilatation of the aortic  root, measuring 39 mm. There is borderline dilatation of the ascending  aorta, measuring 37 mm.   Venous: The inferior vena cava is normal in size with greater than 50%  respiratory variability, suggesting right atrial pressure of 3 mmHg.   IAS/Shunts: No atrial level shunt detected by color flow Doppler.         Diastology  LV e' medial:    4.65 cm/s  LV E/e' medial:  14.5  LV e' lateral:   5.48 cm/s  LV E/e' lateral: 12.3      RIGHT VENTRICLE  RV Basal diam:  4.00 cm  RV S prime:     13.70 cm/s   LEFT ATRIUM           Index        RIGHT ATRIUM           Index  LA Vol (A2C): 50.0 ml 23.56 ml/m  RA Area:     19.30 cm  LA Vol (A4C): 42.0 ml 19.79 ml/m  RA Volume:   61.70 ml  29.08 ml/m   AORTIC VALVE  AV Vmax:           176.67 cm/s  AV Vmean:          122.333 cm/s  AV VTI:            0.341 m  AV Peak Grad:      12.5 mmHg  AV Mean Grad:      6.7 mmHg  LVOT Vmax:         63.15 cm/s  LVOT Vmean:        45.250 cm/s  LVOT VTI:          0.133 m  LVOT/AV VTI ratio: 0.39     AORTA  Ao Root diam: 3.90 cm  Ao Asc diam:  3.70 cm   MITRAL VALVE  MV Area (PHT): 3.33 cm    SHUNTS  MV Peak grad:  3.5 mmHg    Systemic VTI: 0.13 m  MV Mean grad:  1.0 mmHg  MV Vmax:       0.94 m/s  MV Vmean:      50.0 cm/s  MV Decel Time: 228 msec  MV E velocity: 67.30 cm/s  MV A velocity: 93.00 cm/s  MV E/A ratio:  0.72   Kardie Tobb DO  Electronically signed by Berniece Salines DO  Signature Date/Time: 07/12/2021/2:24:59 PM    I  have independently reviewed the above radiologic studies and discussed with the patient   Recent Lab Findings: Lab Results  Component Value Date   WBC 8.3 07/13/2021   HGB 14.6 07/13/2021   HCT 43.7 07/13/2021   PLT 122 (L) 07/13/2021   GLUCOSE 91 07/11/2021   CHOL 191 07/12/2021   TRIG 140 07/12/2021   HDL 45 07/12/2021   LDLCALC 118 (H) 07/12/2021   NA 137 07/11/2021   K 4.1 07/11/2021   CL 104 07/11/2021   CREATININE 1.17 07/11/2021   BUN 13 07/11/2021   CO2 24 07/11/2021   TSH 2.228 07/12/2021   INR 1.0 07/12/2021   HGBA1C 5.4 07/12/2021      Assessment / Plan:    Very pleasant 76 year old gentleman with known coronary artery disease presents with a brief episode of exertional angina without evidence of infarction.  Left heart catheterization demonstrates advanced three-vessel coronary  artery disease and mildly reduced left ventricular function.  The echocardiogram obtained yesterday showed mild aortic stenosis with thickened aortic valve leaflets along with mild dilation of the aortic root to 39 mm.  There was no mitral insufficiency or mitral stenosis.  The right ventricle was of normal size and function.  He has remained free of chest pain since admission to the hospital.  Agree that his coronary disease would be managed best with surgical revascularization.  The procedure and expected perioperative course were discussed with Brian Sullivan and his wife.  Their questions were answered.  They would like for [place] to proceed with planning for surgery later this week.    Brian Sullivan' wife, who is a full time nurse practitioner in [place],  mentioned that her husband had sundowning for about 3 days after his hip replacement in late 2021.  She would like to stay with him as much as possible after surgery.   I  spent 25 minutes counseling the patient face to face.   Antony Odea, PA-C  07/13/2021 2:25 PM  Patient examined, images of today's coronary arteriograms and  transthoracic 2D echocardiogram personally reviewed and discussed with patient and his wife [name].  Very nice 88 year old retired nondiabetic with prior history of CAD and PCI of the distal RCA in 2003 at outside hospital.  He had recurrent severe chest pain while shopping with his wife 2 days ago and was brought to the hospital by EMT and admitted to cardiology for acute coronary syndrome and probable non-STEMI.  Today cardiac catheterization showed progression of disease with too tight 90% lesions in the proximal large dominant RCA prior to the previous stent which is still patent.  The LAD has an 85% mid stenosis in a nondominant circumflex has 80 to 90% stenosis of the circumflex marginal.  Ventriculogram was not performed but LVEDP was normal.  An echocardiogram with poor images showed a known calcified aortic valve with probable minimal aortic stenosis and a low gradient and good LV systolic function.  The patient is currently comfortable on IV heparin in sinus rhythm.  Surgical assessment of this individual patient demonstrates adequate cardiac function and adequate targets of the RCA and LAD and possibly the circumflex.  His pulmonary function and renal function also appear to be adequate.  He has had no previous history of stroke but has had significant migraines and previous brain MRI shows some scarring related to childbirth issues.  Overall he does appear rather fragile and has had several falls recently.  He suffers from vertigo and uses a cane to walk.  I expect him to have fairly prolonged weakness and a prolonged recovery due to his preoperative functional status.  He also complains of irritable bowel syndrome and would be at high risk for postoperative ileus.  Overall the patient would obtain significant benefit from multivessel CABG and proceeding with surgery would be his best option with the understanding that his recovery will probably be prolonged.  The patient's wife is a NP and will be an  excellent resource for the patient. Surgical schedule for a.m. January 6   Due to the patients back pain associated  with chest pain, extreme difficulty engaging the coronaries at cath, no LV gram and poor quality transthoracic echo and minimal troponin levels, will need a preop CTA to evaluate the aortic root  and ascending aortic anatomy.

## 2021-07-13 NOTE — Interval H&P Note (Signed)
Cath Lab Visit (complete for each Cath Lab visit)  Clinical Evaluation Leading to the Procedure:   ACS: No.  Non-ACS:    Anginal Classification: CCS III  Anti-ischemic medical therapy: Minimal Therapy (1 class of medications)  Non-Invasive Test Results: No non-invasive testing performed  Prior CABG: No previous CABG      History and Physical Interval Note:  07/13/2021 10:15 AM  Brian Sullivan  has presented today for surgery, with the diagnosis of unstable angina.  The various methods of treatment have been discussed with the patient and family. After consideration of risks, benefits and other options for treatment, the patient has consented to  Procedure(s): LEFT HEART CATH AND CORONARY ANGIOGRAPHY (N/A) as a surgical intervention.  The patient's history has been reviewed, patient examined, no change in status, stable for surgery.  I have reviewed the patient's chart and labs.  Questions were answered to the patient's satisfaction.     Brian Sullivan

## 2021-07-13 NOTE — Progress Notes (Signed)
ANTICOAGULATION CONSULT NOTE - Follow Up Consult  Pharmacy Consult for IV Heparin Indication: chest pain/ACS  Allergies  Allergen Reactions   Gemfibrozil Other (See Comments)   Paroxetine Hcl Itching   Statins Other (See Comments)    Leg pain   Sulfamethoxazole Other (See Comments)    Nausea, leg pain     Patient Measurements: Height: 5\' 11"  (180.3 cm) Weight: 92.1 kg (203 lb) IBW/kg (Calculated) : 75.3 Heparin Dosing Weight:  92.1 kg  Vital Signs: Temp: 97.7 F (36.5 C) (01/04 1138) Temp Source: Oral (01/04 1138) BP: 153/91 (01/04 1138) Pulse Rate: 84 (01/04 1138)  Labs: Recent Labs    07/11/21 1510 07/11/21 1703 07/11/21 2039 07/12/21 0615 07/12/21 1654 07/13/21 0515  HGB 15.3  --   --   --   --  14.6  HCT 46.9  --   --   --   --  43.7  PLT 155  --   --   --   --  122*  LABPROT  --   --   --  13.6  --   --   INR  --   --   --  1.0  --   --   HEPARINUNFRC  --   --   --   --  0.49 0.56  CREATININE 1.17  --   --   --   --   --   TROPONINIHS 7 9 7   --   --   --     Estimated Creatinine Clearance: 61.3 mL/min (by C-G formula based on SCr of 1.17 mg/dL).   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 Pharmacy was consulted to dose IV heparin for ACS. Pt was not on anticoagulant PTA. Cath planned for 07/13/21.  Initial heparin level ~8 hrs after heparin 4000 units IV bolus X 1, followed by heparin infusion at 1250 units/hr, was 0.49 units/ml, which is within the goal range for this pt. H/H 15.3/46.9, plt 155, INR 1.0. Per RN, no issues with IV or bleeding observed.  Goal of Therapy:  Heparin level 0.3-0.7 units/ml Monitor platelets by anticoagulation protocol: Yes  Current heparin level 0.56   Plan:  Continue heparin infusion at 1250 units/hr Monitor daily heparin level, CBC Monitor for bleeding Will F/U after cath today, 07/13/21.   Vaughan Basta BS, PharmD, BCPS Clinical Pharmacist 07/13/2021,11:44 AM

## 2021-07-14 ENCOUNTER — Encounter (HOSPITAL_COMMUNITY): Payer: Self-pay | Admitting: Cardiovascular Disease

## 2021-07-14 ENCOUNTER — Inpatient Hospital Stay (HOSPITAL_COMMUNITY): Payer: Medicare Other

## 2021-07-14 DIAGNOSIS — Z01810 Encounter for preprocedural cardiovascular examination: Secondary | ICD-10-CM | POA: Diagnosis not present

## 2021-07-14 DIAGNOSIS — I2510 Atherosclerotic heart disease of native coronary artery without angina pectoris: Secondary | ICD-10-CM

## 2021-07-14 DIAGNOSIS — I200 Unstable angina: Secondary | ICD-10-CM | POA: Diagnosis not present

## 2021-07-14 LAB — CBC
HCT: 38.5 % — ABNORMAL LOW (ref 39.0–52.0)
Hemoglobin: 12.8 g/dL — ABNORMAL LOW (ref 13.0–17.0)
MCH: 30.5 pg (ref 26.0–34.0)
MCHC: 33.2 g/dL (ref 30.0–36.0)
MCV: 91.9 fL (ref 80.0–100.0)
Platelets: 112 10*3/uL — ABNORMAL LOW (ref 150–400)
RBC: 4.19 MIL/uL — ABNORMAL LOW (ref 4.22–5.81)
RDW: 13.2 % (ref 11.5–15.5)
WBC: 7.6 10*3/uL (ref 4.0–10.5)
nRBC: 0 % (ref 0.0–0.2)

## 2021-07-14 LAB — COMPREHENSIVE METABOLIC PANEL
ALT: 17 U/L (ref 0–44)
AST: 18 U/L (ref 15–41)
Albumin: 3.1 g/dL — ABNORMAL LOW (ref 3.5–5.0)
Alkaline Phosphatase: 80 U/L (ref 38–126)
Anion gap: 6 (ref 5–15)
BUN: 14 mg/dL (ref 8–23)
CO2: 24 mmol/L (ref 22–32)
Calcium: 8.6 mg/dL — ABNORMAL LOW (ref 8.9–10.3)
Chloride: 105 mmol/L (ref 98–111)
Creatinine, Ser: 1.07 mg/dL (ref 0.61–1.24)
GFR, Estimated: >60 mL/min (ref >60)
Glucose, Bld: 96 mg/dL (ref 70–99)
Potassium: 4.1 mmol/L (ref 3.5–5.1)
Sodium: 135 mmol/L (ref 135–145)
Total Bilirubin: 0.7 mg/dL (ref 0.3–1.2)
Total Protein: 5.7 g/dL — ABNORMAL LOW (ref 6.5–8.1)

## 2021-07-14 LAB — BLOOD GAS, ARTERIAL
Acid-Base Excess: 1.9 mmol/L (ref 0.0–2.0)
Bicarbonate: 26.1 mmol/L (ref 20.0–28.0)
Drawn by: 56037
FIO2: 21
O2 Saturation: 94.5 %
Patient temperature: 36.8
pCO2 arterial: 41.3 mmHg (ref 32.0–48.0)
pH, Arterial: 7.416 (ref 7.350–7.450)
pO2, Arterial: 73.3 mmHg — ABNORMAL LOW (ref 83.0–108.0)

## 2021-07-14 LAB — HEMOGLOBIN A1C
Hgb A1c MFr Bld: 5.4 % (ref 4.8–5.6)
Mean Plasma Glucose: 108.28 mg/dL

## 2021-07-14 LAB — SURGICAL PCR SCREEN
MRSA, PCR: NEGATIVE
Staphylococcus aureus: NEGATIVE

## 2021-07-14 LAB — TSH: TSH: 1.682 u[IU]/mL (ref 0.350–4.500)

## 2021-07-14 LAB — HEPARIN LEVEL (UNFRACTIONATED)
Heparin Unfractionated: 0.37 IU/mL (ref 0.30–0.70)
Heparin Unfractionated: 0.35 IU/mL (ref 0.30–0.70)

## 2021-07-14 LAB — ABO/RH: ABO/RH(D): B POS

## 2021-07-14 LAB — PREPARE RBC (CROSSMATCH)

## 2021-07-14 MED ORDER — EPINEPHRINE HCL 5 MG/250ML IV SOLN IN NS
0.0000 ug/min | INTRAVENOUS | Status: DC
  Filled 2021-07-14: qty 250

## 2021-07-14 MED ORDER — MAGNESIUM SULFATE 50 % IJ SOLN
40.0000 meq | INTRAMUSCULAR | Status: DC
  Filled 2021-07-14: qty 9.85

## 2021-07-14 MED ORDER — TEMAZEPAM 15 MG PO CAPS: 15.0000 mg | ORAL_CAPSULE | Freq: Once | ORAL | Status: DC | PRN

## 2021-07-14 MED ORDER — METOPROLOL TARTRATE 12.5 MG HALF TABLET
12.5000 mg | ORAL_TABLET | Freq: Once | ORAL | Status: AC
  Administered 2021-07-15: 12.5 mg via ORAL
  Filled 2021-07-14: qty 1

## 2021-07-14 MED ORDER — CHLORHEXIDINE GLUCONATE 0.12 % MT SOLN
15.0000 mL | Freq: Once | OROMUCOSAL | Status: AC
  Administered 2021-07-15: 15 mL via OROMUCOSAL
  Filled 2021-07-14: qty 15

## 2021-07-14 MED ORDER — IOHEXOL 350 MG/ML SOLN
100.0000 mL | Freq: Once | INTRAVENOUS | Status: AC | PRN
  Administered 2021-07-14: 100 mL via INTRAVENOUS

## 2021-07-14 MED ORDER — VANCOMYCIN HCL 1500 MG/300ML IV SOLN
1500.0000 mg | INTRAVENOUS | Status: AC
  Administered 2021-07-15: 1500 mg via INTRAVENOUS
  Filled 2021-07-14: qty 300

## 2021-07-14 MED ORDER — CEFAZOLIN SODIUM-DEXTROSE 2-4 GM/100ML-% IV SOLN
2.0000 g | INTRAVENOUS | Status: AC
  Administered 2021-07-15: 2 g via INTRAVENOUS
  Filled 2021-07-14: qty 100

## 2021-07-14 MED ORDER — POTASSIUM CHLORIDE 2 MEQ/ML IV SOLN
80.0000 meq | INTRAVENOUS | Status: DC
  Filled 2021-07-14: qty 40

## 2021-07-14 MED ORDER — POLYETHYLENE GLYCOL 3350 17 G PO PACK
17.0000 g | PACK | Freq: Once | ORAL | Status: AC
  Administered 2021-07-14: 17 g via ORAL
  Filled 2021-07-14: qty 1

## 2021-07-14 MED ORDER — NOREPINEPHRINE 4 MG/250ML-% IV SOLN
0.0000 ug/min | INTRAVENOUS | Status: DC
  Filled 2021-07-14: qty 250

## 2021-07-14 MED ORDER — TRANEXAMIC ACID (OHS) PUMP PRIME SOLUTION
2.0000 mg/kg | INTRAVENOUS | Status: DC
  Filled 2021-07-14: qty 1.84

## 2021-07-14 MED ORDER — HEPARIN 30,000 UNITS/1000 ML (OHS) CELLSAVER SOLUTION
Status: DC
  Filled 2021-07-14: qty 1000

## 2021-07-14 MED ORDER — NITROGLYCERIN IN D5W 200-5 MCG/ML-% IV SOLN
2.0000 ug/min | INTRAVENOUS | Status: DC
  Filled 2021-07-14: qty 250

## 2021-07-14 MED ORDER — MILRINONE LACTATE IN DEXTROSE 20-5 MG/100ML-% IV SOLN
0.3000 ug/kg/min | INTRAVENOUS | Status: AC
  Administered 2021-07-15: .25 ug/kg/min via INTRAVENOUS
  Filled 2021-07-14: qty 100

## 2021-07-14 MED ORDER — TRANEXAMIC ACID (OHS) BOLUS VIA INFUSION
15.0000 mg/kg | INTRAVENOUS | Status: AC
  Administered 2021-07-15: 1230 mg via INTRAVENOUS
  Filled 2021-07-14: qty 1230

## 2021-07-14 MED ORDER — DEXMEDETOMIDINE HCL IN NACL 400 MCG/100ML IV SOLN
0.1000 ug/kg/h | INTRAVENOUS | Status: AC
  Administered 2021-07-15: .5 ug/kg/h via INTRAVENOUS
  Administered 2021-07-15: .7 ug/kg/h via INTRAVENOUS
  Filled 2021-07-14: qty 100

## 2021-07-14 MED ORDER — DIAZEPAM 5 MG PO TABS
5.0000 mg | ORAL_TABLET | Freq: Once | ORAL | Status: AC
  Administered 2021-07-15: 5 mg via ORAL
  Filled 2021-07-14: qty 1

## 2021-07-14 MED ORDER — CHLORHEXIDINE GLUCONATE 4 % EX LIQD
60.0000 mL | Freq: Once | CUTANEOUS | Status: AC
  Administered 2021-07-15: 4 via TOPICAL
  Filled 2021-07-14: qty 60

## 2021-07-14 MED ORDER — CHLORHEXIDINE GLUCONATE 4 % EX LIQD
60.0000 mL | Freq: Once | CUTANEOUS | Status: AC
  Administered 2021-07-14: 4 via TOPICAL
  Filled 2021-07-14: qty 118
  Filled 2021-07-14: qty 60

## 2021-07-14 MED ORDER — PLASMA-LYTE A IV SOLN
INTRAVENOUS | Status: DC
  Filled 2021-07-14: qty 5

## 2021-07-14 MED ORDER — PHENYLEPHRINE HCL-NACL 20-0.9 MG/250ML-% IV SOLN
30.0000 ug/min | INTRAVENOUS | Status: AC
  Administered 2021-07-15: 25 ug/min via INTRAVENOUS
  Filled 2021-07-14: qty 250

## 2021-07-14 MED ORDER — INSULIN REGULAR(HUMAN) IN NACL 100-0.9 UT/100ML-% IV SOLN
INTRAVENOUS | Status: AC
  Administered 2021-07-15: 1 [IU]/h via INTRAVENOUS
  Filled 2021-07-14: qty 100

## 2021-07-14 MED ORDER — BISACODYL 5 MG PO TBEC: 5.0000 mg | DELAYED_RELEASE_TABLET | Freq: Once | ORAL | Status: DC

## 2021-07-14 MED ORDER — TRANEXAMIC ACID 1000 MG/10ML IV SOLN
1.5000 mg/kg/h | INTRAVENOUS | Status: AC
  Administered 2021-07-15: 1.5 mg/kg/h via INTRAVENOUS
  Filled 2021-07-14: qty 25

## 2021-07-14 NOTE — Progress Notes (Signed)
ANTICOAGULATION CONSULT NOTE - Follow Up Consult  Pharmacy Consult for IV Heparin Indication: chest pain/ACS  Allergies  Allergen Reactions   Gemfibrozil Other (See Comments)   Paroxetine Hcl Itching   Statins Other (See Comments)    Leg pain   Sulfamethoxazole Other (See Comments)    Nausea, leg pain     Patient Measurements: Height: 5\' 11"  (180.3 cm) Weight: 92.1 kg (203 lb) IBW/kg (Calculated) : 75.3 Heparin Dosing Weight:  92.1 kg  Vital Signs: Temp: 98.3 F (36.8 C) (01/05 1137) Temp Source: Oral (01/05 1137) BP: 93/57 (01/05 1137) Pulse Rate: 68 (01/05 1137)  Labs: Recent Labs    07/11/21 1510 07/11/21 1703 07/11/21 2039 07/12/21 0615 07/12/21 1654 07/13/21 0515 07/13/21 2250 07/14/21 0320 07/14/21 1209  HGB 15.3  --   --   --   --  14.6  --  12.8*  --   HCT 46.9  --   --   --   --  43.7  --  38.5*  --   PLT 155  --   --   --   --  122*  --  112*  --   LABPROT  --   --   --  13.6  --   --   --   --   --   INR  --   --   --  1.0  --   --   --   --   --   HEPARINUNFRC  --   --   --   --    < > 0.56 0.19* 0.37 0.35  CREATININE 1.17  --   --   --   --   --   --  1.07  --   TROPONINIHS 7 9 7   --   --   --   --   --   --    < > = values in this interval not displayed.    Estimated Creatinine Clearance: 67.1 mL/min (by C-G formula based on SCr of 1.07 mg/dL).   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 Now s/p LHC with three vessel disease for CABG eval.   No s/s of bleeding noted per RN   Goal of Therapy:  Heparin level 0.3-0.7 units/ml Monitor platelets by anticoagulation protocol: Yes    Plan:  Continue heparin infusion at 1300  units/hr F/u AM HL  Monitor daily heparin level, CBC Monitor for bleeding  Vaughan Basta BS, PharmD, BCPS Clinical Pharmacist 07/14/2021 1:08 PM Please refer to [facility] for unit-specific pharmacist

## 2021-07-14 NOTE — Progress Notes (Signed)
°  Transition of Care [facility]) Screening Note   Patient Details  Name: Brian Sullivan Date of Birth: Jan 28, 1946   Transition of Care [facility]) CM/SW Contact:    Dawayne Patricia, RN Phone Number: 07/14/2021, 12:08 PM    Transition of Care Department [facility]) has reviewed patient and no TOC needs have been identified at this time. Noted pt with MVD and plan for CABG on 07/15/21- TOC will follow post op and continue to monitor patient advancement through interdisciplinary progression rounds. If new patient transition needs arise, please place a TOC consult.

## 2021-07-14 NOTE — Progress Notes (Signed)
Discussed with pt and wife IS (2100 ml), sternal precautions, mobility post op and d/c planning. Very receptive. Pt was able to get out of bed following sternal precautions with verbal cues. He needed to rock to stand. Probably will benefit from PT post op. Gave pt materials to read.  [place], ACSM 2:48 PM 07/14/2021

## 2021-07-14 NOTE — Progress Notes (Signed)
Pre cabg has been completed.   Preliminary results in CV Proc.   Brian Sullivan 07/14/2021 3:17 PM

## 2021-07-14 NOTE — Progress Notes (Addendum)
ANTICOAGULATION CONSULT NOTE - Follow Up Consult  Pharmacy Consult for IV Heparin Indication: chest pain/ACS  Allergies  Allergen Reactions   Gemfibrozil Other (See Comments)   Paroxetine Hcl Itching   Statins Other (See Comments)    Leg pain   Sulfamethoxazole Other (See Comments)    Nausea, leg pain     Patient Measurements: Height: 5\' 11"  (180.3 cm) Weight: 92.1 kg (203 lb) IBW/kg (Calculated) : 75.3 Heparin Dosing Weight:  92.1 kg  Vital Signs: Temp: 98.5 F (36.9 C) (01/04 2329) Temp Source: Oral (01/04 2329) BP: 113/69 (01/04 2329) Pulse Rate: 72 (01/04 2329)  Labs: Recent Labs    07/11/21 1510 07/11/21 1703 07/11/21 2039 07/12/21 0615 07/12/21 1654 07/13/21 0515 07/13/21 2250  HGB 15.3  --   --   --   --  14.6  --   HCT 46.9  --   --   --   --  43.7  --   PLT 155  --   --   --   --  122*  --   LABPROT  --   --   --  13.6  --   --   --   INR  --   --   --  1.0  --   --   --   HEPARINUNFRC  --   --   --   --  0.49 0.56 0.19*  CREATININE 1.17  --   --   --   --   --   --   TROPONINIHS 7 9 7   --   --   --   --     Estimated Creatinine Clearance: 61.3 mL/min (by C-G formula based on SCr of 1.17 mg/dL).   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 Now s/p LHC with three vessel disease for CABG eval.   Heparin restarted today. Initial heparin level is subtherapeutic at 0.19 but drawn early although would expect level to be a bit higher. No s/s of bleeding noted per RN   Goal of Therapy:  Heparin level 0.3-0.7 units/ml Monitor platelets by anticoagulation protocol: Yes    Plan:  Increase heparin infusion slightly to 1300 units/hr F/u AM HL  Monitor daily heparin level, CBC Monitor for bleeding  Albertina Parr, PharmD., BCPS, BCCCP Clinical Pharmacist Please refer to [facility] for unit-specific pharmacist   Addendum:  Repeat AM heparin level is now therapeutic at 0.37. Continue heparin infusion at 1300 units/hr. F/u 8 hr confirmatory level.   Albertina Parr, PharmD., BCPS, BCCCP Clinical Pharmacist Please refer to [facility] for unit-specific pharmacist

## 2021-07-14 NOTE — Progress Notes (Signed)
1 Day Post-Op Procedure(s) (LRB): LEFT HEART CATH AND CORONARY ANGIOGRAPHY (N/A) Subjective: No chest pain today on heparin Had preop CTA showing no significant great vessel disease Carotid dopplers w/o sig disease Was seen and counseled by cardiac rehab phase 1 I discussed the results of prfeop studies with the patient and wife- set for CABG in am  Objective: Vital signs in last 24 hours: Temp:  [98 F (36.7 C)-98.6 F (37 C)] 98 F (36.7 C) (01/05 1649) Pulse Rate:  [63-83] 63 (01/05 1649) Cardiac Rhythm: Normal sinus rhythm (01/05 0700) Resp:  [18-20] 18 (01/05 1649) BP: (93-126)/(57-78) 121/78 (01/05 1649) SpO2:  [94 %-98 %] 98 % (01/05 1649)  Hemodynamic parameters for last 24 hours:  nsr  Intake/Output from previous day: 01/04 0701 - 01/05 0700 In: 311.7 [P.O.:240; I.V.:71.7] Out: 550 [Urine:550] Intake/Output this shift: Total I/O In: 243 [P.O.:240; I.V.:3] Out: 775 [Urine:775]  EXAM      Exam    General- alert and comfortable    Neck- no JVD, no cervical adenopathy palpable, no carotid bruit   Lungs- clear without rales, wheezes   Cor- regular rate and rhythm, no murmur , gallop   Abdomen- soft, non-tender   Extremities - warm, non-tender, minimal edema- bilateral bruises on extremities   Neuro- oriented, appropriate, no focal weakness, walked today with PT   Lab Results: Recent Labs    07/13/21 0515 07/14/21 0320  WBC 8.3 7.6  HGB 14.6 12.8*  HCT 43.7 38.5*  PLT 122* 112*   BMET:  Recent Labs    07/14/21 0320  NA 135  K 4.1  CL 105  CO2 24  GLUCOSE 96  BUN 14  CREATININE 1.07  CALCIUM 8.6*    PT/INR:  Recent Labs    07/12/21 0615  LABPROT 13.6  INR 1.0   ABG No results found for: PHART, HCO3, TCO2, ACIDBASEDEF, O2SAT CBG (last 3)  No results for input(s): GLUCAP in the last 72 hours.  Assessment/Plan: S/P Procedure(s) (LRB): LEFT HEART CATH AND CORONARY ANGIOGRAPHY (N/A) Plan multi-vessel CABG in am - benefits and risks  reviewed with the patient and wife.They are ready to proceed.   LOS: 2 days    Dahlia Byes 07/14/2021

## 2021-07-14 NOTE — Progress Notes (Addendum)
Cardiology Progress Note  Patient ID: Brian Sullivan MRN: [id number] DOB: [birth year] Date of Encounter: 07/14/2021  Primary Cardiologist: Berniece Salines, DO  Subjective   Chief Complaint: None.   HPI: LHC with 3-vessel CAD. CABG tomorrow. Denies symptoms.   ROS:  All other ROS reviewed and negative. Pertinent positives noted in the HPI.     Inpatient Medications  Scheduled Meds:  aspirin  81 mg Oral Daily   carvedilol  6.25 mg Oral BID WC   [START ON 07/15/2021] epinephrine  0-10 mcg/min Intravenous To OR   escitalopram  10 mg Oral QHS   ezetimibe  10 mg Oral Daily   [START ON 07/15/2021] heparin-papaverine-plasmalyte irrigation   Irrigation To OR   [START ON 07/15/2021] insulin   Intravenous To OR   isosorbide mononitrate  30 mg Oral Daily   levothyroxine  50 mcg Oral Q0600   losartan  100 mg Oral Daily   [START ON 07/15/2021] magnesium sulfate  40 mEq Other To OR   [START ON 07/15/2021] phenylephrine  30-200 mcg/min Intravenous To OR   [START ON 07/15/2021] potassium chloride  80 mEq Other To OR   pravastatin  20 mg Oral Daily   sodium chloride flush  3 mL Intravenous Q12H   [START ON 07/15/2021] tranexamic acid  15 mg/kg (Adjusted) Intravenous To OR   [START ON 07/15/2021] tranexamic acid  2 mg/kg Intracatheter To OR   Continuous Infusions:  sodium chloride     [START ON 07/15/2021]  ceFAZolin (ANCEF) IV     [START ON 07/15/2021]  ceFAZolin (ANCEF) IV     [START ON 07/15/2021] dexmedetomidine     [START ON 07/15/2021] heparin 30,000 units/NS 1000 mL solution for CELLSAVER     heparin 1,300 Units/hr (07/14/21 0913)   [START ON 07/15/2021] milrinone     [START ON 07/15/2021] nitroGLYCERIN     [START ON 07/15/2021] norepinephrine     [START ON 07/15/2021] tranexamic acid (CYKLOKAPRON) infusion ([facility])     [START ON 07/15/2021] vancomycin     PRN Meds: sodium chloride, acetaminophen, alum & mag hydroxide-simeth, ondansetron (ZOFRAN) IV, sodium chloride flush   Vital Signs   Vitals:   07/13/21 2053  07/13/21 2329 07/14/21 0329 07/14/21 0734  BP: (!) 109/58 113/69 126/71 125/67  Pulse: 83 72 68 75  Resp: 19 20 20 20   Temp: 98.3 F (36.8 C) 98.5 F (36.9 C) 98.5 F (36.9 C) 98.6 F (37 C)  TempSrc: Oral Oral Oral Oral  SpO2: 95% 95% 94% 98%  Weight:      Height:        Intake/Output Summary (Last 24 hours) at 07/14/2021 1018 Last data filed at 07/14/2021 0910 Gross per 24 hour  Intake 314.72 ml  Output 550 ml  Net -235.28 ml   Last 3 Weights 07/12/2021 07/12/2021 01/20/2021  Weight (lbs) 203 lb 203 lb 197 lb  Weight (kg) 92.08 kg 92.08 kg 89.359 kg      Telemetry  Overnight telemetry shows SR 70s, which I personally reviewed.   Physical Exam   Vitals:   07/13/21 2053 07/13/21 2329 07/14/21 0329 07/14/21 0734  BP: (!) 109/58 113/69 126/71 125/67  Pulse: 83 72 68 75  Resp: 19 20 20 20   Temp: 98.3 F (36.8 C) 98.5 F (36.9 C) 98.5 F (36.9 C) 98.6 F (37 C)  TempSrc: Oral Oral Oral Oral  SpO2: 95% 95% 94% 98%  Weight:      Height:        Intake/Output Summary (Last  24 hours) at 07/14/2021 1018 Last data filed at 07/14/2021 0910 Gross per 24 hour  Intake 314.72 ml  Output 550 ml  Net -235.28 ml    Last 3 Weights 07/12/2021 07/12/2021 01/20/2021  Weight (lbs) 203 lb 203 lb 197 lb  Weight (kg) 92.08 kg 92.08 kg 89.359 kg    Body mass index is 28.31 kg/m.  General: Well nourished, well developed, in no acute distress Head: Atraumatic, normal size  Eyes: PEERLA, EOMI  Neck: Supple, no JVD Endocrine: No thryomegaly Cardiac: Normal S1, S2; RRR; no murmurs, rubs, or gallops Lungs: Clear to auscultation bilaterally, no wheezing, rhonchi or rales  Abd: Soft, nontender, no hepatomegaly  Ext: No edema, pulses 2+, right radial cath site clean and dry, 2+ pulse Musculoskeletal: No deformities, BUE and BLE strength normal and equal Skin: Warm and dry, no rashes   Neuro: Alert and oriented to person, place, time, and situation, CNII-XII grossly intact, no focal deficits  Psych:  Normal mood and affect   Labs  High Sensitivity Troponin:   Recent Labs  Lab 07/11/21 1510 07/11/21 1703 07/11/21 2039  TROPONINIHS 7 9 7      Cardiac EnzymesNo results for input(s): TROPONINI in the last 168 hours. No results for input(s): TROPIPOC in the last 168 hours.  Chemistry Recent Labs  Lab 07/11/21 1510 07/14/21 0320  NA 137 135  K 4.1 4.1  CL 104 105  CO2 24 24  GLUCOSE 91 96  BUN 13 14  CREATININE 1.17 1.07  CALCIUM 9.0 8.6*  PROT  --  5.7*  ALBUMIN  --  3.1*  AST  --  18  ALT  --  17  ALKPHOS  --  80  BILITOT  --  0.7  GFRNONAA >60 >60  ANIONGAP 9 6    Hematology Recent Labs  Lab 07/11/21 1510 07/13/21 0515 07/14/21 0320  WBC 8.7 8.3 7.6  RBC 5.06 4.72 4.19*  HGB 15.3 14.6 12.8*  HCT 46.9 43.7 38.5*  MCV 92.7 92.6 91.9  MCH 30.2 30.9 30.5  MCHC 32.6 33.4 33.2  RDW 13.1 13.1 13.2  PLT 155 122* 112*   BNPNo results for input(s): BNP, PROBNP in the last 168 hours.  DDimer No results for input(s): DDIMER in the last 168 hours.   Radiology  CARDIAC CATHETERIZATION  Addendum Date: 07/13/2021     Prox RCA lesion is 90% stenosed.   Prox RCA to Mid RCA lesion is 90% stenosed.   Dist RCA lesion is 30% stenosed.   Prox Cx lesion is 40% stenosed.   2nd Mrg lesion is 80% stenosed.   Mid Cx lesion is 90% stenosed.   Mid LAD lesion is 85% stenosed.   Ost LAD to Prox LAD lesion is 50% stenosed. 1.  Severe heavily calcified three-vessel coronary artery disease. 2.  Left ventricular angiography was not performed.  EF was 50 to 55% by echo.  Normal left ventricular end-diastolic pressure at 6 mmHg. 3.  Mild aortic stenosis with peak to peak gradient of 16 mmHg. 3.  Very difficult procedure via the right radial artery due to tortuosity of the innominate artery and difficult engagement of the coronary arteries in spite of using a long sheath. Recommendations: Given diffuse calcified three-vessel coronary artery disease, recommend evaluation for CABG. Resume heparin at 6  PM.  Result Date: 07/13/2021   Prox RCA lesion is 90% stenosed.   Prox RCA to Mid RCA lesion is 90% stenosed.   Dist RCA lesion is 30% stenosed.  Prox Cx lesion is 40% stenosed.   2nd Mrg lesion is 80% stenosed.   Mid Cx lesion is 90% stenosed.   Mid LAD lesion is 85% stenosed.   Ost LAD to Prox LAD lesion is 50% stenosed. 1.  Severe heavily calcified three-vessel coronary artery disease. 2.  Left ventricular angiography was not performed.  EF was 50 to 55% by echo.  Normal left ventricular end-diastolic pressure at 6 mmHg. 3.  Mild aortic stenosis with peak to peak gradient of 16 mmHg. Recommendations: Given diffuse calcified three-vessel coronary artery disease, recommend evaluation for CABG. Resume heparin at 6 PM.   ECHOCARDIOGRAM COMPLETE  Result Date: 07/12/2021    ECHOCARDIOGRAM REPORT   Patient Name:   Brian Sullivan Date of Exam: 07/12/2021 Medical Rec #:  [id number]    Height:       71.0 in Accession #:    [id number]   Weight:       203.0 lb Date of Birth:  [birth year]     BSA:          2.122 m Patient Age:    47 years     BP:           135/90 mmHg Patient Gender: M            HR:           68 bpm. Exam Location:  Inpatient Procedure: 2D Echo, Cardiac Doppler, Color Doppler and Intracardiac            Opacification Agent Indications:    R07.9* Chest pain, unspecified  History:        Patient has prior history of Echocardiogram examinations, most                 recent 04/09/2020. CAD, Abnormal ECG; Signs/Symptoms:Chest Pain.  Sonographer:    Glo Herring Referring Phys: [id number] [place]  1. Left ventricular ejection fraction, by estimation, is 50 to 55%. The left ventricle has low normal function. The left ventricle has no regional wall motion abnormalities. Left ventricular diastolic parameters are indeterminate.  2. Right ventricular systolic function is normal. The right ventricular size is normal.  3. The mitral valve is normal in structure. No evidence of mitral valve  regurgitation. No evidence of mitral stenosis.  4. The aortic valve is thickened and moderately calcified. Unable to determine level of aortic stenosis, AV doppler not well assessed. Unable to perform planimetry due to poor visibility of the aortic valve leaflet tips. May need a repeat imaging for better interogation of the aortic valve if clinical picture depicts. The aortic valve is calcified. Aortic valve regurgitation is not visualized. No aortic stenosis is present.  5. Aortic dilatation noted. There is mild dilatation of the aortic root, measuring 39 mm. There is borderline dilatation of the ascending aorta, measuring 37 mm.  6. The inferior vena cava is normal in size with greater than 50% respiratory variability, suggesting right atrial pressure of 3 mmHg. FINDINGS  Left Ventricle: Left ventricular ejection fraction, by estimation, is 50 to 55%. The left ventricle has low normal function. The left ventricle has no regional wall motion abnormalities. The left ventricular internal cavity size was normal in size. There is no left ventricular hypertrophy. Left ventricular diastolic parameters are indeterminate. Right Ventricle: The right ventricular size is normal. No increase in right ventricular wall thickness. Right ventricular systolic function is normal. Left Atrium: Left atrial size was normal in size. Right Atrium: Right atrial size was normal  in size. Pericardium: There is no evidence of pericardial effusion. Mitral Valve: The mitral valve is normal in structure. No evidence of mitral valve regurgitation. No evidence of mitral valve stenosis. MV peak gradient, 3.5 mmHg. The mean mitral valve gradient is 1.0 mmHg. Tricuspid Valve: The tricuspid valve is normal in structure. Tricuspid valve regurgitation is not demonstrated. No evidence of tricuspid stenosis. Aortic Valve: The aortic valve is thickened and moderately calcified. Unable to determine level of aortic stenosis, AV doppler not well assessed.  Unable to perform planimetry due to poor visibility of the aortic valve leaflet tips. May need a repeat imaging for better interogation of the aortic valve if clinical picture depicts. The aortic valve is calcified. Aortic valve regurgitation is not visualized. No aortic stenosis is present. Aortic valve mean gradient measures 6.7 mmHg. Aortic valve peak gradient measures 12.5 mmHg. Pulmonic Valve: The pulmonic valve was normal in structure. Pulmonic valve regurgitation is not visualized. No evidence of pulmonic stenosis. Aorta: Aortic dilatation noted. There is mild dilatation of the aortic root, measuring 39 mm. There is borderline dilatation of the ascending aorta, measuring 37 mm. Venous: The inferior vena cava is normal in size with greater than 50% respiratory variability, suggesting right atrial pressure of 3 mmHg. IAS/Shunts: No atrial level shunt detected by color flow Doppler.   Diastology LV e' medial:    4.65 cm/s LV E/e' medial:  14.5 LV e' lateral:   5.48 cm/s LV E/e' lateral: 12.3  RIGHT VENTRICLE RV Basal diam:  4.00 cm RV S prime:     13.70 cm/s LEFT ATRIUM           Index        RIGHT ATRIUM           Index LA Vol (A2C): 50.0 ml 23.56 ml/m  RA Area:     19.30 cm LA Vol (A4C): 42.0 ml 19.79 ml/m  RA Volume:   61.70 ml  29.08 ml/m  AORTIC VALVE AV Vmax:           176.67 cm/s AV Vmean:          122.333 cm/s AV VTI:            0.341 m AV Peak Grad:      12.5 mmHg AV Mean Grad:      6.7 mmHg LVOT Vmax:         63.15 cm/s LVOT Vmean:        45.250 cm/s LVOT VTI:          0.133 m LVOT/AV VTI ratio: 0.39  AORTA Ao Root diam: 3.90 cm Ao Asc diam:  3.70 cm MITRAL VALVE MV Area (PHT): 3.33 cm    SHUNTS MV Peak grad:  3.5 mmHg    Systemic VTI: 0.13 m MV Mean grad:  1.0 mmHg MV Vmax:       0.94 m/s MV Vmean:      50.0 cm/s MV Decel Time: 228 msec MV E velocity: 67.30 cm/s MV A velocity: 93.00 cm/s MV E/A ratio:  0.72 Kardie Tobb DO Electronically signed by Berniece Salines DO Signature Date/Time:  07/12/2021/2:24:59 PM    Final     Cardiac Studies  LHC 07/13/2021   Prox RCA lesion is 90% stenosed.   Prox RCA to Mid RCA lesion is 90% stenosed.   Dist RCA lesion is 30% stenosed.   Prox Cx lesion is 40% stenosed.   2nd Mrg lesion is 80% stenosed.   Mid Cx lesion is 90%  stenosed.   Mid LAD lesion is 85% stenosed.   Ost LAD to Prox LAD lesion is 50% stenosed.   1.  Severe heavily calcified three-vessel coronary artery disease. 2.  Left ventricular angiography was not performed.  EF was 50 to 55% by echo.  Normal left ventricular end-diastolic pressure at 6 mmHg. 3.  Mild aortic stenosis with peak to peak gradient of 16 mmHg. 3.  Very difficult procedure via the right radial artery due to tortuosity of the innominate artery and difficult engagement of the coronary arteries in spite of using a long sheath.  TTE 07/12/2021  1. Left ventricular ejection fraction, by estimation, is 50 to 55%. The  left ventricle has low normal function. The left ventricle has no regional  wall motion abnormalities. Left ventricular diastolic parameters are  indeterminate.   2. Right ventricular systolic function is normal. The right ventricular  size is normal.   3. The mitral valve is normal in structure. No evidence of mitral valve  regurgitation. No evidence of mitral stenosis.   4. The aortic valve is thickened and moderately calcified. Unable to  determine level of aortic stenosis, AV doppler not well assessed. Unable  to perform planimetry due to poor visibility of the aortic valve leaflet  tips. May need a repeat imaging for  better interogation of the aortic valve if clinical picture depicts. The  aortic valve is calcified. Aortic valve regurgitation is not visualized.  No aortic stenosis is present.   5. Aortic dilatation noted. There is mild dilatation of the aortic root,  measuring 39 mm. There is borderline dilatation of the ascending aorta,  measuring 37 mm.   6. The inferior vena cava is  normal in size with greater than 50%  respiratory variability, suggesting right atrial pressure of 3 mmHg.   Patient Profile  Brian Sullivan is a 76 y.o. male with CAD status post RCA PCI in 2003, GERD, hypertension, hypothyroidism, hyperlipidemia who was admitted on 07/11/2021 for unstable angina.  Assessment & Plan   #Unstable angina #3v CAD #HLD -Admitted with recurrent episodes of chest pressure at rest.  Troponins negative.  EKG with nonspecific ST-T changes.  Echo shows normal wall motion. -Left heart cath with three-vessel CAD.  Plans for CABG tomorrow.  N.p.o. at midnight. -On heparin drip. ASA 81 mg daily.  -Echo with normal LV function.  No significant valvular heart disease on my review. -Continue Coreg 12.5 mg twice daily.  On Imdur 30 mg daily.  No further chest pain episodes. -Continue Zetia 10 mg daily.  Lipitor and Crestor intolerant.  Rechallenged on pravastatin 20 mg daily.  PCSK9 inhibitor therapy as an outpatient.  #Hypertension -Continue Coreg 12.5 mg twice daily.  Continue losartan 100 mg daily.  #Hypothyroidism -Home Synthroid.  #Mild AS -Suspect mild AS.  TEE before bypass.  FEN -no intravenous fluids -Diet: Heart healthy -DVT PPx: Heparin drip -Code: Full  For questions or updates, please contact [place] Please consult [email or web address] for contact info under    Signed, [place] Bells T. Audie Box, MD, [place]  07/14/2021 10:18 AM

## 2021-07-15 ENCOUNTER — Encounter (HOSPITAL_COMMUNITY): Payer: Self-pay | Admitting: Cardiovascular Disease

## 2021-07-15 ENCOUNTER — Inpatient Hospital Stay (HOSPITAL_COMMUNITY): Payer: Medicare Other | Admitting: Anesthesiology

## 2021-07-15 ENCOUNTER — Inpatient Hospital Stay (HOSPITAL_COMMUNITY): Payer: Medicare Other

## 2021-07-15 ENCOUNTER — Inpatient Hospital Stay (HOSPITAL_COMMUNITY)
Admission: EM | Disposition: A | Discharge status: Home Health | Payer: Self-pay | Source: Home / Self Care | Attending: Cardiothoracic Surgery

## 2021-07-15 DIAGNOSIS — Z951 Presence of aortocoronary bypass graft: Secondary | ICD-10-CM

## 2021-07-15 DIAGNOSIS — I2510 Atherosclerotic heart disease of native coronary artery without angina pectoris: Secondary | ICD-10-CM

## 2021-07-15 HISTORY — PX: TEE WITHOUT CARDIOVERSION: SHX5443

## 2021-07-15 HISTORY — PX: ENDOVEIN HARVEST OF GREATER SAPHENOUS VEIN: SHX5059

## 2021-07-15 HISTORY — PX: CORONARY ARTERY BYPASS GRAFT: SHX141

## 2021-07-15 LAB — POCT I-STAT 7, (LYTES, BLD GAS, ICA,H+H)
Acid-Base Excess: 0 mmol/L (ref 0.0–2.0)
Acid-Base Excess: 2 mmol/L (ref 0.0–2.0)
Acid-Base Excess: 1 mmol/L (ref 0.0–2.0)
Acid-Base Excess: 0 mmol/L (ref 0.0–2.0)
Acid-Base Excess: 1 mmol/L (ref 0.0–2.0)
Acid-Base Excess: 2 mmol/L (ref 0.0–2.0)
Acid-Base Excess: 0 mmol/L (ref 0.0–2.0)
Acid-base deficit: 1 mmol/L (ref 0.0–2.0)
Acid-base deficit: 3 mmol/L — ABNORMAL HIGH (ref 0.0–2.0)
Acid-base deficit: 4 mmol/L — ABNORMAL HIGH (ref 0.0–2.0)
Bicarbonate: 25.6 mmol/L (ref 20.0–28.0)
Bicarbonate: 27 mmol/L (ref 20.0–28.0)
Bicarbonate: 24.7 mmol/L (ref 20.0–28.0)
Bicarbonate: 25 mmol/L (ref 20.0–28.0)
Bicarbonate: 26 mmol/L (ref 20.0–28.0)
Bicarbonate: 26.8 mmol/L (ref 20.0–28.0)
Bicarbonate: 23.8 mmol/L (ref 20.0–28.0)
Bicarbonate: 23.8 mmol/L (ref 20.0–28.0)
Bicarbonate: 22.6 mmol/L (ref 20.0–28.0)
Bicarbonate: 22.4 mmol/L (ref 20.0–28.0)
Calcium, Ion: 1.23 mmol/L (ref 1.15–1.40)
Calcium, Ion: 1 mmol/L — ABNORMAL LOW (ref 1.15–1.40)
Calcium, Ion: 0.89 mmol/L — CL (ref 1.15–1.40)
Calcium, Ion: 0.95 mmol/L — ABNORMAL LOW (ref 1.15–1.40)
Calcium, Ion: 0.97 mmol/L — ABNORMAL LOW (ref 1.15–1.40)
Calcium, Ion: 0.98 mmol/L — ABNORMAL LOW (ref 1.15–1.40)
Calcium, Ion: 0.95 mmol/L — ABNORMAL LOW (ref 1.15–1.40)
Calcium, Ion: 1.01 mmol/L — ABNORMAL LOW (ref 1.15–1.40)
Calcium, Ion: 1.12 mmol/L — ABNORMAL LOW (ref 1.15–1.40)
Calcium, Ion: 1.14 mmol/L — ABNORMAL LOW (ref 1.15–1.40)
HCT: 40 % (ref 39.0–52.0)
HCT: 27 % — ABNORMAL LOW (ref 39.0–52.0)
HCT: 26 % — ABNORMAL LOW (ref 39.0–52.0)
HCT: 26 % — ABNORMAL LOW (ref 39.0–52.0)
HCT: 26 % — ABNORMAL LOW (ref 39.0–52.0)
HCT: 29 % — ABNORMAL LOW (ref 39.0–52.0)
HCT: 28 % — ABNORMAL LOW (ref 39.0–52.0)
HCT: 29 % — ABNORMAL LOW (ref 39.0–52.0)
HCT: 31 % — ABNORMAL LOW (ref 39.0–52.0)
HCT: 30 % — ABNORMAL LOW (ref 39.0–52.0)
Hemoglobin: 13.6 g/dL (ref 13.0–17.0)
Hemoglobin: 9.2 g/dL — ABNORMAL LOW (ref 13.0–17.0)
Hemoglobin: 8.8 g/dL — ABNORMAL LOW (ref 13.0–17.0)
Hemoglobin: 8.8 g/dL — ABNORMAL LOW (ref 13.0–17.0)
Hemoglobin: 8.8 g/dL — ABNORMAL LOW (ref 13.0–17.0)
Hemoglobin: 9.9 g/dL — ABNORMAL LOW (ref 13.0–17.0)
Hemoglobin: 9.5 g/dL — ABNORMAL LOW (ref 13.0–17.0)
Hemoglobin: 9.9 g/dL — ABNORMAL LOW (ref 13.0–17.0)
Hemoglobin: 10.5 g/dL — ABNORMAL LOW (ref 13.0–17.0)
Hemoglobin: 10.2 g/dL — ABNORMAL LOW (ref 13.0–17.0)
O2 Saturation: 100 %
O2 Saturation: 100 %
O2 Saturation: 100 %
O2 Saturation: 100 %
O2 Saturation: 100 %
O2 Saturation: 100 %
O2 Saturation: 100 %
O2 Saturation: 95 %
O2 Saturation: 97 %
O2 Saturation: 91 %
Patient temperature: 35.3
Patient temperature: 37.1
Patient temperature: 36.7
Potassium: 4 mmol/L (ref 3.5–5.1)
Potassium: 4.5 mmol/L (ref 3.5–5.1)
Potassium: 4.6 mmol/L (ref 3.5–5.1)
Potassium: 4.5 mmol/L (ref 3.5–5.1)
Potassium: 4.6 mmol/L (ref 3.5–5.1)
Potassium: 4.7 mmol/L (ref 3.5–5.1)
Potassium: 4.1 mmol/L (ref 3.5–5.1)
Potassium: 3.9 mmol/L (ref 3.5–5.1)
Potassium: 4.6 mmol/L (ref 3.5–5.1)
Potassium: 4.4 mmol/L (ref 3.5–5.1)
Sodium: 139 mmol/L (ref 135–145)
Sodium: 138 mmol/L (ref 135–145)
Sodium: 137 mmol/L (ref 135–145)
Sodium: 136 mmol/L (ref 135–145)
Sodium: 136 mmol/L (ref 135–145)
Sodium: 136 mmol/L (ref 135–145)
Sodium: 138 mmol/L (ref 135–145)
Sodium: 139 mmol/L (ref 135–145)
Sodium: 138 mmol/L (ref 135–145)
Sodium: 138 mmol/L (ref 135–145)
TCO2: 27 mmol/L (ref 22–32)
TCO2: 28 mmol/L (ref 22–32)
TCO2: 26 mmol/L (ref 22–32)
TCO2: 26 mmol/L (ref 22–32)
TCO2: 27 mmol/L (ref 22–32)
TCO2: 28 mmol/L (ref 22–32)
TCO2: 25 mmol/L (ref 22–32)
TCO2: 25 mmol/L (ref 22–32)
TCO2: 24 mmol/L (ref 22–32)
TCO2: 24 mmol/L (ref 22–32)
pCO2 arterial: 44.5 mmHg (ref 32.0–48.0)
pCO2 arterial: 40.5 mmHg (ref 32.0–48.0)
pCO2 arterial: 33 mmHg (ref 32.0–48.0)
pCO2 arterial: 38.6 mmHg (ref 32.0–48.0)
pCO2 arterial: 39 mmHg (ref 32.0–48.0)
pCO2 arterial: 46.1 mmHg (ref 32.0–48.0)
pCO2 arterial: 35.5 mmHg (ref 32.0–48.0)
pCO2 arterial: 37.9 mmHg (ref 32.0–48.0)
pCO2 arterial: 41.6 mmHg (ref 32.0–48.0)
pCO2 arterial: 42.8 mmHg (ref 32.0–48.0)
pH, Arterial: 7.369 (ref 7.350–7.450)
pH, Arterial: 7.432 (ref 7.350–7.450)
pH, Arterial: 7.481 — ABNORMAL HIGH (ref 7.350–7.450)
pH, Arterial: 7.372 (ref 7.350–7.450)
pH, Arterial: 7.414 (ref 7.350–7.450)
pH, Arterial: 7.436 (ref 7.350–7.450)
pH, Arterial: 7.433 (ref 7.350–7.450)
pH, Arterial: 7.399 (ref 7.350–7.450)
pH, Arterial: 7.344 — ABNORMAL LOW (ref 7.350–7.450)
pH, Arterial: 7.325 — ABNORMAL LOW (ref 7.350–7.450)
pO2, Arterial: 439 mmHg — ABNORMAL HIGH (ref 83.0–108.0)
pO2, Arterial: 394 mmHg — ABNORMAL HIGH (ref 83.0–108.0)
pO2, Arterial: 324 mmHg — ABNORMAL HIGH (ref 83.0–108.0)
pO2, Arterial: 234 mmHg — ABNORMAL HIGH (ref 83.0–108.0)
pO2, Arterial: 255 mmHg — ABNORMAL HIGH (ref 83.0–108.0)
pO2, Arterial: 267 mmHg — ABNORMAL HIGH (ref 83.0–108.0)
pO2, Arterial: 217 mmHg — ABNORMAL HIGH (ref 83.0–108.0)
pO2, Arterial: 70 mmHg — ABNORMAL LOW (ref 83.0–108.0)
pO2, Arterial: 92 mmHg (ref 83.0–108.0)
pO2, Arterial: 66 mmHg — ABNORMAL LOW (ref 83.0–108.0)

## 2021-07-15 LAB — POCT I-STAT, CHEM 8
BUN: 11 mg/dL (ref 8–23)
BUN: 10 mg/dL (ref 8–23)
BUN: 9 mg/dL (ref 8–23)
BUN: 9 mg/dL (ref 8–23)
BUN: 8 mg/dL (ref 8–23)
Calcium, Ion: 1.22 mmol/L (ref 1.15–1.40)
Calcium, Ion: 1.22 mmol/L (ref 1.15–1.40)
Calcium, Ion: 0.9 mmol/L — ABNORMAL LOW (ref 1.15–1.40)
Calcium, Ion: 0.97 mmol/L — ABNORMAL LOW (ref 1.15–1.40)
Calcium, Ion: 0.94 mmol/L — ABNORMAL LOW (ref 1.15–1.40)
Chloride: 103 mmol/L (ref 98–111)
Chloride: 105 mmol/L (ref 98–111)
Chloride: 101 mmol/L (ref 98–111)
Chloride: 100 mmol/L (ref 98–111)
Chloride: 101 mmol/L (ref 98–111)
Creatinine, Ser: 0.8 mg/dL (ref 0.61–1.24)
Creatinine, Ser: 0.8 mg/dL (ref 0.61–1.24)
Creatinine, Ser: 0.6 mg/dL — ABNORMAL LOW (ref 0.61–1.24)
Creatinine, Ser: 0.6 mg/dL — ABNORMAL LOW (ref 0.61–1.24)
Creatinine, Ser: 0.6 mg/dL — ABNORMAL LOW (ref 0.61–1.24)
Glucose, Bld: 97 mg/dL (ref 70–99)
Glucose, Bld: 97 mg/dL (ref 70–99)
Glucose, Bld: 93 mg/dL (ref 70–99)
Glucose, Bld: 112 mg/dL — ABNORMAL HIGH (ref 70–99)
Glucose, Bld: 109 mg/dL — ABNORMAL HIGH (ref 70–99)
HCT: 41 % (ref 39.0–52.0)
HCT: 35 % — ABNORMAL LOW (ref 39.0–52.0)
HCT: 25 % — ABNORMAL LOW (ref 39.0–52.0)
HCT: 26 % — ABNORMAL LOW (ref 39.0–52.0)
HCT: 25 % — ABNORMAL LOW (ref 39.0–52.0)
Hemoglobin: 13.9 g/dL (ref 13.0–17.0)
Hemoglobin: 11.9 g/dL — ABNORMAL LOW (ref 13.0–17.0)
Hemoglobin: 8.5 g/dL — ABNORMAL LOW (ref 13.0–17.0)
Hemoglobin: 8.8 g/dL — ABNORMAL LOW (ref 13.0–17.0)
Hemoglobin: 8.5 g/dL — ABNORMAL LOW (ref 13.0–17.0)
Potassium: 4 mmol/L (ref 3.5–5.1)
Potassium: 4.2 mmol/L (ref 3.5–5.1)
Potassium: 4.7 mmol/L (ref 3.5–5.1)
Potassium: 4.7 mmol/L (ref 3.5–5.1)
Potassium: 4 mmol/L (ref 3.5–5.1)
Sodium: 140 mmol/L (ref 135–145)
Sodium: 137 mmol/L (ref 135–145)
Sodium: 137 mmol/L (ref 135–145)
Sodium: 135 mmol/L (ref 135–145)
Sodium: 137 mmol/L (ref 135–145)
TCO2: 27 mmol/L (ref 22–32)
TCO2: 25 mmol/L (ref 22–32)
TCO2: 25 mmol/L (ref 22–32)
TCO2: 25 mmol/L (ref 22–32)
TCO2: 26 mmol/L (ref 22–32)

## 2021-07-15 LAB — HEMOGLOBIN AND HEMATOCRIT, BLOOD
HCT: 26.4 % — ABNORMAL LOW (ref 39.0–52.0)
Hemoglobin: 9 g/dL — ABNORMAL LOW (ref 13.0–17.0)

## 2021-07-15 LAB — CBC
HCT: 39.6 % (ref 39.0–52.0)
HCT: 31 % — ABNORMAL LOW (ref 39.0–52.0)
HCT: 32.6 % — ABNORMAL LOW (ref 39.0–52.0)
Hemoglobin: 12.8 g/dL — ABNORMAL LOW (ref 13.0–17.0)
Hemoglobin: 10.2 g/dL — ABNORMAL LOW (ref 13.0–17.0)
Hemoglobin: 11.1 g/dL — ABNORMAL LOW (ref 13.0–17.0)
MCH: 29.8 pg (ref 26.0–34.0)
MCH: 30.3 pg (ref 26.0–34.0)
MCH: 31 pg (ref 26.0–34.0)
MCHC: 32.3 g/dL (ref 30.0–36.0)
MCHC: 32.9 g/dL (ref 30.0–36.0)
MCHC: 34 g/dL (ref 30.0–36.0)
MCV: 92.1 fL (ref 80.0–100.0)
MCV: 92 fL (ref 80.0–100.0)
MCV: 91.1 fL (ref 80.0–100.0)
Platelets: 131 10*3/uL — ABNORMAL LOW (ref 150–400)
Platelets: UNDETERMINED 10*3/uL (ref 150–400)
Platelets: 130 10*3/uL — ABNORMAL LOW (ref 150–400)
RBC: 4.3 MIL/uL (ref 4.22–5.81)
RBC: 3.37 MIL/uL — ABNORMAL LOW (ref 4.22–5.81)
RBC: 3.58 MIL/uL — ABNORMAL LOW (ref 4.22–5.81)
RDW: 13.2 % (ref 11.5–15.5)
RDW: 13 % (ref 11.5–15.5)
RDW: 12.7 % (ref 11.5–15.5)
WBC: 6.4 10*3/uL (ref 4.0–10.5)
WBC: 12 10*3/uL — ABNORMAL HIGH (ref 4.0–10.5)
WBC: 15 10*3/uL — ABNORMAL HIGH (ref 4.0–10.5)
nRBC: 0 % (ref 0.0–0.2)
nRBC: 0 % (ref 0.0–0.2)
nRBC: 0 % (ref 0.0–0.2)

## 2021-07-15 LAB — POCT I-STAT EG7
Acid-Base Excess: 0 mmol/L (ref 0.0–2.0)
Bicarbonate: 24.6 mmol/L (ref 20.0–28.0)
Calcium, Ion: 1.04 mmol/L — ABNORMAL LOW (ref 1.15–1.40)
HCT: 29 % — ABNORMAL LOW (ref 39.0–52.0)
Hemoglobin: 9.9 g/dL — ABNORMAL LOW (ref 13.0–17.0)
O2 Saturation: 87 %
Potassium: 4.4 mmol/L (ref 3.5–5.1)
Sodium: 139 mmol/L (ref 135–145)
TCO2: 26 mmol/L (ref 22–32)
pCO2, Ven: 37.5 mmHg — ABNORMAL LOW (ref 44.0–60.0)
pH, Ven: 7.425 (ref 7.250–7.430)
pO2, Ven: 52 mmHg — ABNORMAL HIGH (ref 32.0–45.0)

## 2021-07-15 LAB — PROTIME-INR
INR: 1.5 — ABNORMAL HIGH (ref 0.8–1.2)
Prothrombin Time: 17.8 seconds — ABNORMAL HIGH (ref 11.4–15.2)

## 2021-07-15 LAB — BASIC METABOLIC PANEL
Anion gap: 7 (ref 5–15)
Anion gap: 6 (ref 5–15)
BUN: 12 mg/dL (ref 8–23)
BUN: 10 mg/dL (ref 8–23)
CO2: 25 mmol/L (ref 22–32)
CO2: 23 mmol/L (ref 22–32)
Calcium: 8.6 mg/dL — ABNORMAL LOW (ref 8.9–10.3)
Calcium: 7.4 mg/dL — ABNORMAL LOW (ref 8.9–10.3)
Chloride: 105 mmol/L (ref 98–111)
Chloride: 106 mmol/L (ref 98–111)
Creatinine, Ser: 0.94 mg/dL (ref 0.61–1.24)
Creatinine, Ser: 0.91 mg/dL (ref 0.61–1.24)
GFR, Estimated: >60 mL/min (ref >60)
GFR, Estimated: >60 mL/min (ref >60)
Glucose, Bld: 99 mg/dL (ref 70–99)
Glucose, Bld: 150 mg/dL — ABNORMAL HIGH (ref 70–99)
Potassium: 3.9 mmol/L (ref 3.5–5.1)
Potassium: 4.5 mmol/L (ref 3.5–5.1)
Sodium: 137 mmol/L (ref 135–145)
Sodium: 135 mmol/L (ref 135–145)

## 2021-07-15 LAB — GLUCOSE, CAPILLARY
Glucose-Capillary: 125 mg/dL — ABNORMAL HIGH (ref 70–99)
Glucose-Capillary: 118 mg/dL — ABNORMAL HIGH (ref 70–99)
Glucose-Capillary: 118 mg/dL — ABNORMAL HIGH (ref 70–99)
Glucose-Capillary: 128 mg/dL — ABNORMAL HIGH (ref 70–99)
Glucose-Capillary: 135 mg/dL — ABNORMAL HIGH (ref 70–99)
Glucose-Capillary: 138 mg/dL — ABNORMAL HIGH (ref 70–99)
Glucose-Capillary: 173 mg/dL — ABNORMAL HIGH (ref 70–99)
Glucose-Capillary: 153 mg/dL — ABNORMAL HIGH (ref 70–99)

## 2021-07-15 LAB — APTT: aPTT: 25 seconds (ref 24–36)

## 2021-07-15 LAB — MAGNESIUM: Magnesium: 3 mg/dL — ABNORMAL HIGH (ref 1.7–2.4)

## 2021-07-15 LAB — HEPARIN LEVEL (UNFRACTIONATED): Heparin Unfractionated: 0.48 IU/mL (ref 0.30–0.70)

## 2021-07-15 LAB — PLATELET COUNT: Platelets: 111 10*3/uL — ABNORMAL LOW (ref 150–400)

## 2021-07-15 SURGERY — CORONARY ARTERY BYPASS GRAFTING (CABG)
Anesthesia: General | Site: Leg Lower | Laterality: Right

## 2021-07-15 MED ORDER — LIDOCAINE 2% (20 MG/ML) 5 ML SYRINGE
INTRAMUSCULAR | Status: AC
  Filled 2021-07-15: qty 5

## 2021-07-15 MED ORDER — PLASMA-LYTE A IV SOLN: INTRAVENOUS | Status: DC | PRN

## 2021-07-15 MED ORDER — PHENYLEPHRINE 40 MCG/ML (10ML) SYRINGE FOR IV PUSH (FOR BLOOD PRESSURE SUPPORT)
PREFILLED_SYRINGE | INTRAVENOUS | Status: DC | PRN
  Administered 2021-07-15 (×3): 80 ug via INTRAVENOUS

## 2021-07-15 MED ORDER — MIDAZOLAM HCL 2 MG/2ML IJ SOLN: 2.0000 mg | INTRAMUSCULAR | Status: DC | PRN

## 2021-07-15 MED ORDER — SODIUM CHLORIDE 0.9 % IV SOLN: INTRAVENOUS | Status: DC | PRN

## 2021-07-15 MED ORDER — PROPOFOL 10 MG/ML IV BOLUS
INTRAVENOUS | Status: AC
  Filled 2021-07-15: qty 20

## 2021-07-15 MED ORDER — ALBUMIN HUMAN 5 % IV SOLN
250.0000 mL | INTRAVENOUS | Status: AC | PRN
  Administered 2021-07-15 (×2): 12.5 g via INTRAVENOUS

## 2021-07-15 MED ORDER — FENTANYL CITRATE (PF) 250 MCG/5ML IJ SOLN
INTRAMUSCULAR | Status: AC
  Filled 2021-07-15: qty 5

## 2021-07-15 MED ORDER — HEPARIN SODIUM (PORCINE) 1000 UNIT/ML IJ SOLN
INTRAMUSCULAR | Status: AC
  Filled 2021-07-15: qty 1

## 2021-07-15 MED ORDER — CEFAZOLIN SODIUM-DEXTROSE 2-4 GM/100ML-% IV SOLN
2.0000 g | Freq: Three times a day (TID) | INTRAVENOUS | Status: AC
  Administered 2021-07-16 – 2021-07-17 (×4): 2 g via INTRAVENOUS
  Filled 2021-07-15 (×4): qty 100

## 2021-07-15 MED ORDER — MORPHINE SULFATE (PF) 2 MG/ML IV SOLN: 1.0000 mg | INTRAVENOUS | Status: DC | PRN

## 2021-07-15 MED ORDER — LACTATED RINGERS IV SOLN: INTRAVENOUS | Status: DC | PRN

## 2021-07-15 MED ORDER — OXYCODONE HCL 5 MG PO TABS
5.0000 mg | ORAL_TABLET | ORAL | Status: DC | PRN
  Administered 2021-07-15 – 2021-07-16 (×2): 5 mg via ORAL
  Filled 2021-07-15 (×2): qty 1

## 2021-07-15 MED ORDER — DEXAMETHASONE SODIUM PHOSPHATE 10 MG/ML IJ SOLN
INTRAMUSCULAR | Status: DC | PRN
  Administered 2021-07-15: 10 mg via INTRAVENOUS

## 2021-07-15 MED ORDER — SODIUM CHLORIDE 0.9 % IV SOLN: INTRAVENOUS | Status: DC

## 2021-07-15 MED ORDER — SODIUM CHLORIDE 0.9% FLUSH
3.0000 mL | Freq: Two times a day (BID) | INTRAVENOUS | Status: DC
  Administered 2021-07-16 – 2021-07-21 (×9): 3 mL via INTRAVENOUS

## 2021-07-15 MED ORDER — MORPHINE SULFATE (PF) 2 MG/ML IV SOLN
2.0000 mg | INTRAVENOUS | Status: DC | PRN
  Administered 2021-07-15 – 2021-07-17 (×6): 2 mg via INTRAVENOUS
  Filled 2021-07-15 (×6): qty 1

## 2021-07-15 MED ORDER — ROCURONIUM BROMIDE 10 MG/ML (PF) SYRINGE
PREFILLED_SYRINGE | INTRAVENOUS | Status: AC
  Filled 2021-07-15: qty 10

## 2021-07-15 MED ORDER — HEMOSTATIC AGENTS (NO CHARGE) OPTIME
TOPICAL | Status: DC | PRN
Start: 2021-07-15 — End: 2021-07-15
  Administered 2021-07-15 (×2): 1 via TOPICAL

## 2021-07-15 MED ORDER — ACETAMINOPHEN 650 MG RE SUPP
650.0000 mg | Freq: Once | RECTAL | Status: AC
  Administered 2021-07-15: 650 mg via RECTAL

## 2021-07-15 MED ORDER — CEFAZOLIN SODIUM-DEXTROSE 2-4 GM/100ML-% IV SOLN
2.0000 g | Freq: Three times a day (TID) | INTRAVENOUS | Status: DC
  Administered 2021-07-15: 2 g via INTRAVENOUS
  Filled 2021-07-15: qty 100

## 2021-07-15 MED ORDER — ASPIRIN 81 MG PO CHEW: 324.0000 mg | CHEWABLE_TABLET | Freq: Every day | ORAL | Status: DC

## 2021-07-15 MED ORDER — DOCUSATE SODIUM 100 MG PO CAPS
200.0000 mg | ORAL_CAPSULE | Freq: Every day | ORAL | Status: DC
  Administered 2021-07-16 – 2021-07-21 (×5): 200 mg via ORAL
  Filled 2021-07-15 (×5): qty 2

## 2021-07-15 MED ORDER — MIDAZOLAM HCL (PF) 10 MG/2ML IJ SOLN
INTRAMUSCULAR | Status: AC
  Filled 2021-07-15: qty 2

## 2021-07-15 MED ORDER — ACETAMINOPHEN 160 MG/5ML PO SOLN: 1000.0000 mg | Freq: Four times a day (QID) | ORAL | Status: AC

## 2021-07-15 MED ORDER — PROTAMINE SULFATE 10 MG/ML IV SOLN
INTRAVENOUS | Status: DC | PRN
  Administered 2021-07-15: 270 mg via INTRAVENOUS
  Administered 2021-07-15: 30 mg via INTRAVENOUS

## 2021-07-15 MED ORDER — DEXMEDETOMIDINE HCL IN NACL 400 MCG/100ML IV SOLN
0.0000 ug/kg/h | INTRAVENOUS | Status: DC
  Administered 2021-07-15: 0.7 ug/kg/h via INTRAVENOUS

## 2021-07-15 MED ORDER — SODIUM CHLORIDE 0.9 % IV SOLN
20.0000 ug | Freq: Once | INTRAVENOUS | Status: AC
  Administered 2021-07-15: 20 ug via INTRAVENOUS
  Filled 2021-07-15: qty 5

## 2021-07-15 MED ORDER — CHLORHEXIDINE GLUCONATE 0.12 % MT SOLN: 15.0000 mL | OROMUCOSAL | Status: AC

## 2021-07-15 MED ORDER — SODIUM CHLORIDE 0.45 % IV SOLN: INTRAVENOUS | Status: DC | PRN

## 2021-07-15 MED ORDER — PROTAMINE SULFATE 10 MG/ML IV SOLN
INTRAVENOUS | Status: AC
  Filled 2021-07-15: qty 5

## 2021-07-15 MED ORDER — FENTANYL CITRATE (PF) 250 MCG/5ML IJ SOLN
INTRAMUSCULAR | Status: DC | PRN
  Administered 2021-07-15: 100 ug via INTRAVENOUS
  Administered 2021-07-15: 150 ug via INTRAVENOUS
  Administered 2021-07-15: 50 ug via INTRAVENOUS
  Administered 2021-07-15: 200 ug via INTRAVENOUS
  Administered 2021-07-15: 150 ug via INTRAVENOUS
  Administered 2021-07-15 (×2): 50 ug via INTRAVENOUS
  Administered 2021-07-15: 100 ug via INTRAVENOUS

## 2021-07-15 MED ORDER — SODIUM CHLORIDE 0.9% FLUSH: 3.0000 mL | INTRAVENOUS | Status: DC | PRN

## 2021-07-15 MED ORDER — LACTATED RINGERS IV SOLN: INTRAVENOUS | Status: DC

## 2021-07-15 MED ORDER — ASPIRIN EC 325 MG PO TBEC
325.0000 mg | DELAYED_RELEASE_TABLET | Freq: Every day | ORAL | Status: DC
Start: 2021-07-16 — End: 2021-07-20
  Administered 2021-07-16 – 2021-07-19 (×4): 325 mg via ORAL
  Filled 2021-07-15 (×4): qty 1

## 2021-07-15 MED ORDER — ESCITALOPRAM OXALATE 10 MG PO TABS
10.0000 mg | ORAL_TABLET | Freq: Every day | ORAL | Status: DC
  Administered 2021-07-16 – 2021-07-20 (×5): 10 mg via ORAL
  Filled 2021-07-15 (×6): qty 1

## 2021-07-15 MED ORDER — DEXAMETHASONE SODIUM PHOSPHATE 10 MG/ML IJ SOLN
INTRAMUSCULAR | Status: AC
  Filled 2021-07-15: qty 1

## 2021-07-15 MED ORDER — ARTIFICIAL TEARS OPHTHALMIC OINT
TOPICAL_OINTMENT | OPHTHALMIC | Status: DC | PRN
  Administered 2021-07-15: 1 via OPHTHALMIC

## 2021-07-15 MED ORDER — PHENYLEPHRINE HCL-NACL 20-0.9 MG/250ML-% IV SOLN
0.0000 ug/min | INTRAVENOUS | Status: DC
  Administered 2021-07-15: 50 ug/min via INTRAVENOUS
  Administered 2021-07-15: 40 ug/min via INTRAVENOUS

## 2021-07-15 MED ORDER — ONDANSETRON HCL 4 MG/2ML IJ SOLN
4.0000 mg | Freq: Four times a day (QID) | INTRAMUSCULAR | Status: DC | PRN
  Administered 2021-07-17 – 2021-07-20 (×2): 4 mg via INTRAVENOUS
  Filled 2021-07-15 (×2): qty 2

## 2021-07-15 MED ORDER — FAMOTIDINE IN NACL 20-0.9 MG/50ML-% IV SOLN
20.0000 mg | Freq: Two times a day (BID) | INTRAVENOUS | Status: AC
  Administered 2021-07-15 (×2): 20 mg via INTRAVENOUS
  Filled 2021-07-15 (×2): qty 50

## 2021-07-15 MED ORDER — TRAMADOL HCL 50 MG PO TABS
50.0000 mg | ORAL_TABLET | ORAL | Status: DC | PRN
  Administered 2021-07-16 – 2021-07-19 (×5): 100 mg via ORAL
  Filled 2021-07-15 (×5): qty 2

## 2021-07-15 MED ORDER — MIDAZOLAM HCL (PF) 5 MG/ML IJ SOLN
INTRAMUSCULAR | Status: DC | PRN
Start: 2021-07-15 — End: 2021-07-15
  Administered 2021-07-15: 4 mg via INTRAVENOUS
  Administered 2021-07-15: 2 mg via INTRAVENOUS
  Administered 2021-07-15: 3 mg via INTRAVENOUS
  Administered 2021-07-15: 1 mg via INTRAVENOUS

## 2021-07-15 MED ORDER — METOPROLOL TARTRATE 12.5 MG HALF TABLET
12.5000 mg | ORAL_TABLET | Freq: Two times a day (BID) | ORAL | Status: DC
  Administered 2021-07-16 (×2): 12.5 mg via ORAL
  Filled 2021-07-15 (×3): qty 1

## 2021-07-15 MED ORDER — ACETAMINOPHEN 160 MG/5ML PO SOLN: 650.0000 mg | Freq: Once | ORAL | Status: AC

## 2021-07-15 MED ORDER — EPHEDRINE SULFATE-NACL 50-0.9 MG/10ML-% IV SOSY
PREFILLED_SYRINGE | INTRAVENOUS | Status: DC | PRN
  Administered 2021-07-15: 5 mg via INTRAVENOUS

## 2021-07-15 MED ORDER — POTASSIUM CHLORIDE 10 MEQ/50ML IV SOLN
10.0000 meq | INTRAVENOUS | Status: AC
  Administered 2021-07-15 (×3): 10 meq via INTRAVENOUS

## 2021-07-15 MED ORDER — PRAVASTATIN SODIUM 10 MG PO TABS
20.0000 mg | ORAL_TABLET | Freq: Every day | ORAL | Status: DC
  Administered 2021-07-16 – 2021-07-21 (×6): 20 mg via ORAL
  Filled 2021-07-15 (×6): qty 2

## 2021-07-15 MED ORDER — ALBUMIN HUMAN 5 % IV SOLN: INTRAVENOUS | Status: DC | PRN

## 2021-07-15 MED ORDER — SODIUM CHLORIDE 0.9% IV SOLUTION: Freq: Once | INTRAVENOUS | Status: DC

## 2021-07-15 MED ORDER — METOPROLOL TARTRATE 25 MG/10 ML ORAL SUSPENSION: 12.5000 mg | Freq: Two times a day (BID) | ORAL | Status: DC

## 2021-07-15 MED ORDER — ROCURONIUM BROMIDE 10 MG/ML (PF) SYRINGE
PREFILLED_SYRINGE | INTRAVENOUS | Status: DC | PRN
  Administered 2021-07-15: 50 mg via INTRAVENOUS
  Administered 2021-07-15: 30 mg via INTRAVENOUS
  Administered 2021-07-15: 50 mg via INTRAVENOUS
  Administered 2021-07-15: 70 mg via INTRAVENOUS
  Administered 2021-07-15: 50 mg via INTRAVENOUS

## 2021-07-15 MED ORDER — ONDANSETRON HCL 4 MG/2ML IJ SOLN
INTRAMUSCULAR | Status: DC | PRN
  Administered 2021-07-15: 4 mg via INTRAVENOUS

## 2021-07-15 MED ORDER — PROPOFOL 10 MG/ML IV BOLUS
INTRAVENOUS | Status: DC | PRN
  Administered 2021-07-15: 20 mg via INTRAVENOUS
  Administered 2021-07-15: 30 mg via INTRAVENOUS
  Administered 2021-07-15: 100 mg via INTRAVENOUS

## 2021-07-15 MED ORDER — SURGIFLO WITH THROMBIN (HEMOSTATIC MATRIX KIT) OPTIME
TOPICAL | Status: DC | PRN
  Administered 2021-07-15: 1 via TOPICAL

## 2021-07-15 MED ORDER — MILRINONE LACTATE IN DEXTROSE 20-5 MG/100ML-% IV SOLN
0.2500 ug/kg/min | INTRAVENOUS | Status: DC
  Administered 2021-07-15 (×2): 0.25 ug/kg/min via INTRAVENOUS
  Filled 2021-07-15: qty 100

## 2021-07-15 MED ORDER — VANCOMYCIN HCL IN DEXTROSE 1-5 GM/200ML-% IV SOLN
1000.0000 mg | Freq: Once | INTRAVENOUS | Status: AC
  Administered 2021-07-15: 1000 mg via INTRAVENOUS
  Filled 2021-07-15: qty 200

## 2021-07-15 MED ORDER — PANTOPRAZOLE SODIUM 40 MG PO TBEC
40.0000 mg | DELAYED_RELEASE_TABLET | Freq: Every day | ORAL | Status: DC
  Administered 2021-07-17 – 2021-07-19 (×3): 40 mg via ORAL
  Filled 2021-07-15 (×3): qty 1

## 2021-07-15 MED ORDER — NITROGLYCERIN IN D5W 200-5 MCG/ML-% IV SOLN
0.0000 ug/min | INTRAVENOUS | Status: DC
  Administered 2021-07-15: 0 ug/min via INTRAVENOUS

## 2021-07-15 MED ORDER — INSULIN REGULAR(HUMAN) IN NACL 100-0.9 UT/100ML-% IV SOLN
INTRAVENOUS | Status: DC
  Administered 2021-07-15: 1.4 [IU]/h via INTRAVENOUS

## 2021-07-15 MED ORDER — METOPROLOL TARTRATE 5 MG/5ML IV SOLN
2.5000 mg | INTRAVENOUS | Status: DC | PRN
  Administered 2021-07-17 – 2021-07-20 (×5): 5 mg via INTRAVENOUS
  Filled 2021-07-15 (×5): qty 5

## 2021-07-15 MED ORDER — LEVOTHYROXINE SODIUM 50 MCG PO TABS
50.0000 ug | ORAL_TABLET | Freq: Every day | ORAL | Status: DC
  Administered 2021-07-16 – 2021-07-21 (×6): 50 ug via ORAL
  Filled 2021-07-15 (×6): qty 1

## 2021-07-15 MED ORDER — 0.9 % SODIUM CHLORIDE (POUR BTL) OPTIME
TOPICAL | Status: DC | PRN
  Administered 2021-07-15: 6000 mL

## 2021-07-15 MED ORDER — PROTAMINE SULFATE 10 MG/ML IV SOLN
INTRAVENOUS | Status: AC
  Filled 2021-07-15: qty 25

## 2021-07-15 MED ORDER — BISACODYL 5 MG PO TBEC
10.0000 mg | DELAYED_RELEASE_TABLET | Freq: Every day | ORAL | Status: DC
  Administered 2021-07-16 – 2021-07-21 (×4): 10 mg via ORAL
  Filled 2021-07-15 (×4): qty 2

## 2021-07-15 MED ORDER — DEXTROSE 50 % IV SOLN: 0.0000 mL | INTRAVENOUS | Status: DC | PRN

## 2021-07-15 MED ORDER — SODIUM CHLORIDE (PF) 0.9 % IJ SOLN
OROMUCOSAL | Status: DC | PRN
  Administered 2021-07-15 (×4): 4 mL via TOPICAL

## 2021-07-15 MED ORDER — SUCCINYLCHOLINE CHLORIDE 200 MG/10ML IV SOSY
PREFILLED_SYRINGE | INTRAVENOUS | Status: AC
  Filled 2021-07-15: qty 10

## 2021-07-15 MED ORDER — MAGNESIUM SULFATE 4 GM/100ML IV SOLN
4.0000 g | Freq: Once | INTRAVENOUS | Status: AC
  Administered 2021-07-15: 4 g via INTRAVENOUS
  Filled 2021-07-15: qty 100

## 2021-07-15 MED ORDER — LACTATED RINGERS IV SOLN
INTRAVENOUS | Status: DC | PRN
Start: 2021-07-15 — End: 2021-07-15

## 2021-07-15 MED ORDER — LACTATED RINGERS IV SOLN: 500.0000 mL | Freq: Once | INTRAVENOUS | Status: DC | PRN

## 2021-07-15 MED ORDER — SODIUM CHLORIDE 0.9 % IV SOLN
250.0000 mL | INTRAVENOUS | Status: DC
  Administered 2021-07-16: 250 mL via INTRAVENOUS

## 2021-07-15 MED ORDER — HEPARIN SODIUM (PORCINE) 1000 UNIT/ML IJ SOLN
INTRAMUSCULAR | Status: DC | PRN
  Administered 2021-07-15: 5000 [IU] via INTRAVENOUS
  Administered 2021-07-15: 25000 [IU] via INTRAVENOUS

## 2021-07-15 MED ORDER — CHLORHEXIDINE GLUCONATE CLOTH 2 % EX PADS
6.0000 | MEDICATED_PAD | Freq: Every day | CUTANEOUS | Status: DC
  Administered 2021-07-15 – 2021-07-19 (×4): 6 via TOPICAL

## 2021-07-15 MED ORDER — ARTIFICIAL TEARS OPHTHALMIC OINT
TOPICAL_OINTMENT | OPHTHALMIC | Status: AC
  Filled 2021-07-15: qty 3.5

## 2021-07-15 MED ORDER — ACETAMINOPHEN 500 MG PO TABS
1000.0000 mg | ORAL_TABLET | Freq: Four times a day (QID) | ORAL | Status: AC
  Administered 2021-07-16 – 2021-07-20 (×18): 1000 mg via ORAL
  Filled 2021-07-15 (×18): qty 2

## 2021-07-15 MED ORDER — BISACODYL 10 MG RE SUPP: 10.0000 mg | Freq: Every day | RECTAL | Status: DC

## 2021-07-15 MED ORDER — PHENYLEPHRINE 40 MCG/ML (10ML) SYRINGE FOR IV PUSH (FOR BLOOD PRESSURE SUPPORT)
PREFILLED_SYRINGE | INTRAVENOUS | Status: AC
  Filled 2021-07-15: qty 10

## 2021-07-15 MED ORDER — ONDANSETRON HCL 4 MG/2ML IJ SOLN
INTRAMUSCULAR | Status: AC
  Filled 2021-07-15: qty 2

## 2021-07-15 MED ORDER — SODIUM CHLORIDE (PF) 0.9 % IJ SOLN
INTRAMUSCULAR | Status: AC
  Filled 2021-07-15: qty 10

## 2021-07-15 SURGICAL SUPPLY — 83 items
ADAPTER CARDIO PERF ANTE/RETRO (ADAPTER) ×4 IMPLANT
BAG DECANTER FOR FLEXI CONT (MISCELLANEOUS) ×4 IMPLANT
BLADE CLIPPER SURG (BLADE) ×4 IMPLANT
BLADE STERNUM SYSTEM 6 (BLADE) ×4 IMPLANT
BLADE SURG 12 STRL SS (BLADE) ×4 IMPLANT
BNDG ELASTIC 4X5.8 VLCR STR LF (GAUZE/BANDAGES/DRESSINGS) ×4 IMPLANT
BNDG ELASTIC 6X5.8 VLCR STR LF (GAUZE/BANDAGES/DRESSINGS) ×1 IMPLANT
BNDG GAUZE ELAST 4 BULKY (GAUZE/BANDAGES/DRESSINGS) ×4 IMPLANT
CANISTER SUCT 3000ML PPV (MISCELLANEOUS) ×4 IMPLANT
CANNULA GUNDRY RCSP 15FR (MISCELLANEOUS) ×4 IMPLANT
CATH CPB KIT VANTRIGT (MISCELLANEOUS) ×4 IMPLANT
CATH ROBINSON RED A/P 18FR (CATHETERS) ×12 IMPLANT
CATH THORACIC 28FR RT ANG (CATHETERS) ×4 IMPLANT
CLIP RETRACTION 3.0MM CORONARY (MISCELLANEOUS) ×1 IMPLANT
CLIP TI WIDE RED SMALL 24 (CLIP) ×1 IMPLANT
DRAIN CHANNEL 32F RND 10.7 FF (WOUND CARE) ×4 IMPLANT
DRAPE CARDIOVASCULAR INCISE (DRAPES) ×1
DRAPE SLUSH/WARMER DISC (DRAPES) ×4 IMPLANT
DRAPE SRG 135X102X78XABS (DRAPES) ×3 IMPLANT
DRSG AQUACEL AG ADV 3.5X14 (GAUZE/BANDAGES/DRESSINGS) ×4 IMPLANT
ELECT BLADE 4.0 EZ CLEAN MEGAD (MISCELLANEOUS) ×4
ELECT BLADE 6.5 EXT (BLADE) ×4 IMPLANT
ELECT CAUTERY BLADE 6.4 (BLADE) ×4 IMPLANT
ELECT REM PT RETURN 9FT ADLT (ELECTROSURGICAL) ×8
ELECTRODE BLDE 4.0 EZ CLN MEGD (MISCELLANEOUS) ×3 IMPLANT
ELECTRODE REM PT RTRN 9FT ADLT (ELECTROSURGICAL) ×6 IMPLANT
FELT TEFLON 1X6 (MISCELLANEOUS) ×8 IMPLANT
GAUZE SPONGE 4X4 12PLY STRL (GAUZE/BANDAGES/DRESSINGS) ×8 IMPLANT
GLOVE SURG ENC MOIS LTX SZ7.5 (GLOVE) ×12 IMPLANT
GOWN STRL REUS W/ TWL LRG LVL3 (GOWN DISPOSABLE) ×12 IMPLANT
GOWN STRL REUS W/TWL LRG LVL3 (GOWN DISPOSABLE) ×4
HEMOSTAT POWDER SURGIFOAM 1G (HEMOSTASIS) ×13 IMPLANT
HEMOSTAT SURGICEL 2X14 (HEMOSTASIS) ×1 IMPLANT
KIT BASIN OR (CUSTOM PROCEDURE TRAY) ×4 IMPLANT
KIT SUCTION CATH 14FR (SUCTIONS) ×4 IMPLANT
KIT TURNOVER KIT B (KITS) ×4 IMPLANT
KIT VASOVIEW HEMOPRO 2 VH 4000 (KITS) ×4 IMPLANT
LEAD PACING MYOCARDI (MISCELLANEOUS) ×4 IMPLANT
MARKER GRAFT CORONARY BYPASS (MISCELLANEOUS) ×12 IMPLANT
NS IRRIG 1000ML POUR BTL (IV SOLUTION) ×21 IMPLANT
PACK E OPEN HEART (SUTURE) ×4 IMPLANT
PACK OPEN HEART (CUSTOM PROCEDURE TRAY) ×4 IMPLANT
PAD ARMBOARD 7.5X6 YLW CONV (MISCELLANEOUS) ×8 IMPLANT
PAD ELECT DEFIB RADIOL ZOLL (MISCELLANEOUS) ×4 IMPLANT
PENCIL BUTTON HOLSTER BLD 10FT (ELECTRODE) ×4 IMPLANT
POSITIONER HEAD DONUT 9IN (MISCELLANEOUS) ×4 IMPLANT
POWDER SURGICEL 3.0 GRAM (HEMOSTASIS) ×1 IMPLANT
PUNCH AORTIC ROTATE 4.5MM 8IN (MISCELLANEOUS) ×1 IMPLANT
PUNCH AORTIC ROTATE 5MM 8IN (MISCELLANEOUS) IMPLANT
SET MPS 3-ND DEL (MISCELLANEOUS) ×1 IMPLANT
SPONGE T-LAP 18X18 ~~LOC~~+RFID (SPONGE) ×1 IMPLANT
SUPPORT HEART JANKE-BARRON (MISCELLANEOUS) ×4 IMPLANT
SURGIFLO W/THROMBIN 8M KIT (HEMOSTASIS) ×4 IMPLANT
SUT BONE WAX W31G (SUTURE) ×4 IMPLANT
SUT MNCRL AB 4-0 PS2 18 (SUTURE) ×1 IMPLANT
SUT PROLENE 3 0 SH DA (SUTURE) IMPLANT
SUT PROLENE 3 0 SH1 36 (SUTURE) IMPLANT
SUT PROLENE 4 0 RB 1 (SUTURE) ×3
SUT PROLENE 4 0 SH DA (SUTURE) ×6 IMPLANT
SUT PROLENE 4-0 RB1 .5 CRCL 36 (SUTURE) ×3 IMPLANT
SUT PROLENE 5 0 C 1 36 (SUTURE) ×5 IMPLANT
SUT PROLENE 6 0 C 1 30 (SUTURE) ×2 IMPLANT
SUT PROLENE 6 0 CC (SUTURE) ×12 IMPLANT
SUT PROLENE 8 0 BV175 6 (SUTURE) IMPLANT
SUT PROLENE BLUE 7 0 (SUTURE) ×4 IMPLANT
SUT PROLENE POLY MONO (SUTURE) ×2 IMPLANT
SUT SILK 2 0 SH CR/8 (SUTURE) ×2 IMPLANT
SUT STEEL 6MS V (SUTURE) ×8 IMPLANT
SUT STEEL SZ 6 DBL 3X14 BALL (SUTURE) ×4 IMPLANT
SUT VIC AB 1 CTX 36 (SUTURE) ×3
SUT VIC AB 1 CTX36XBRD ANBCTR (SUTURE) ×6 IMPLANT
SUT VIC AB 2-0 CT1 27 (SUTURE) ×1
SUT VIC AB 2-0 CT1 TAPERPNT 27 (SUTURE) IMPLANT
SUT VIC AB 3-0 X1 27 (SUTURE) ×2 IMPLANT
SYSTEM SAHARA CHEST DRAIN ATS (WOUND CARE) ×4 IMPLANT
TAPE CLOTH SURG 4X10 WHT LF (GAUZE/BANDAGES/DRESSINGS) ×2 IMPLANT
TAPE PAPER 2X10 WHT MICROPORE (GAUZE/BANDAGES/DRESSINGS) ×1 IMPLANT
TOWEL GREEN STERILE (TOWEL DISPOSABLE) ×4 IMPLANT
TOWEL GREEN STERILE FF (TOWEL DISPOSABLE) ×4 IMPLANT
TRAY FOLEY SLVR 16FR TEMP STAT (SET/KITS/TRAYS/PACK) ×4 IMPLANT
TUBING LAP HI FLOW INSUFFLATIO (TUBING) ×4 IMPLANT
UNDERPAD 30X36 HEAVY ABSORB (UNDERPADS AND DIAPERS) ×4 IMPLANT
WATER STERILE IRR 1000ML POUR (IV SOLUTION) ×8 IMPLANT

## 2021-07-15 NOTE — Hospital Course (Addendum)
History of Present Illness:  Patient examined, images of today's coronary arteriograms and transthoracic 2D echocardiogram personally reviewed and discussed with patient and his wife [name].  Very nice 76 year old retired nondiabetic with prior history of CAD and PCI of the distal RCA in 2003 at outside hospital.  He had recurrent severe chest pain while shopping with his wife 2 days ago and was brought to the hospital by EMT and admitted to cardiology for acute coronary syndrome and probable non-STEMI.  Today cardiac catheterization showed progression of disease with too tight 90% lesions in the proximal large dominant RCA prior to the previous stent which is still patent.  The LAD has an 85% mid stenosis in a nondominant circumflex has 80 to 90% stenosis of the circumflex marginal.  Ventriculogram was not performed but LVEDP was normal.  An echocardiogram with poor images showed a known calcified aortic valve with probable minimal aortic stenosis and a low gradient and good LV systolic function.  The patient is currently comfortable on IV heparin in sinus rhythm.   Surgical assessment of this individual patient demonstrates adequate cardiac function and adequate targets of the RCA and LAD and possibly the circumflex.  His pulmonary function and renal function also appear to be adequate.  He has had no previous history of stroke but has had significant migraines and previous brain MRI shows some scarring related to childbirth issues.  Overall he does appear rather fragile and has had several falls recently.  He suffers from vertigo and uses a cane to walk.  I expect him to have fairly prolonged weakness and a prolonged recovery due to his preoperative functional status.  He also complains of irritable bowel syndrome and would be at high risk for postoperative ileus.  Overall the patient would obtain significant benefit from multivessel CABG and proceeding with surgery would be his best option with the  understanding that his recovery will probably be prolonged.  The patient's wife is a NP and will be an excellent resource for the patient. Surgical schedule for a.m. January 6  Hospital Course:  Patient remained stable following left heart catheterization.  Further work-up included a CTA of the chest that showed no significant great vessel disease.  Carotid Dopplers did not show any obstructive cerebrovascular disease. Mr. Pousson was taken to the operating room on 07/15/2021 where CABG x3 was carried out without complication.  Following the procedure, he separated from cardiopulmonary bypass on milrinone.  He was transferred to the surgical ICU in stable condition. He was extubated later the evening of surgery. He was on Milrinone drip post op. EPW and chest tubes were removed on post op day 2 and he was transferred to 4E Progressive Care.  He progressed well with mobility. He developed atrial fibrillation early on post-op day 3 and was loaded with IV amiodarone. Within a few hours he converted back to stable sinus rhythm. The IV amiodarone was continued for 24 hours then transitioned to the oral form.

## 2021-07-15 NOTE — Discharge Instructions (Addendum)
Discharge Instructions:  1. You may shower, please wash incisions daily with soap and water and keep dry.  If you wish to cover wounds with dressing you may do so but please keep clean and change daily.  No tub baths or swimming until incisions have completely healed.  If your incisions become red or develop any drainage please call our office at [phone]  2. No Driving until cleared by Dr. Thayer Ohm office and you are no longer using narcotic pain medications  3. Monitor your weight daily.. Please use the same scale and weigh at same time... If you gain 5-10 lbs in 48 hours with associated lower extremity swelling, please contact our office at [phone]  4. Fever of 101.5 for at least 24 hours with no source, please contact our office at [phone]  5. Activity- up as tolerated, please walk at least 3 times per day.  Avoid strenuous activity, no lifting, pushing, or pulling with your arms over 8-10 lbs for a minimum of 6 weeks  6. If any questions or concerns arise, please do not hesitate to contact our office at [phone]  Information on my medicine - ELIQUIS (apixaban) Why was Eliquis prescribed for you? Eliquis was prescribed for you to reduce the risk of a blood clot forming that can cause a stroke if you have a medical condition called atrial fibrillation (a type of irregular heartbeat).  What do You need to know about Eliquis ? Take your Eliquis TWICE DAILY - one tablet in the morning and one tablet in the evening with or without food. If you have difficulty swallowing the tablet whole please discuss with your pharmacist how to take the medication safely.  Take Eliquis exactly as prescribed by your doctor and DO NOT stop taking Eliquis without talking to the doctor who prescribed the medication.  Stopping may increase your risk of developing a stroke.  Refill your prescription before you run out.  After discharge, you should have regular check-up appointments with  your healthcare provider that is prescribing your Eliquis.  In the future your dose may need to be changed if your kidney function or weight changes by a significant amount or as you get older.  What do you do if you miss a dose? If you miss a dose, take it as soon as you remember on the same day and resume taking twice daily.  Do not take more than one dose of ELIQUIS at the same time to make up a missed dose.  Important Safety Information A possible side effect of Eliquis is bleeding. You should call your healthcare provider right away if you experience any of the following: Bleeding from an injury or your nose that does not stop. Unusual colored urine (red or dark brown) or unusual colored stools (red or black). Unusual bruising for unknown reasons. A serious fall or if you hit your head (even if there is no bleeding).  Some medicines may interact with Eliquis and might increase your risk of bleeding or clotting while on Eliquis. To help avoid this, consult your healthcare provider or pharmacist prior to using any new prescription or non-prescription medications, including herbals, vitamins, non-steroidal anti-inflammatory drugs (NSAIDs) and supplements.  This website has more information on Eliquis (apixaban): http://www.eliquis.com/eliquis/home

## 2021-07-15 NOTE — Anesthesia Procedure Notes (Signed)
Procedure Name: Intubation Date/Time: 07/15/2021 7:50 AM Performed by: Harden Mo, CRNA Pre-anesthesia Checklist: Patient identified, Emergency Drugs available, Suction available and Patient being monitored Patient Re-evaluated:Patient Re-evaluated prior to induction Oxygen Delivery Method: Circle System Utilized Preoxygenation: Pre-oxygenation with 100% oxygen Induction Type: IV induction Ventilation: Mask ventilation without difficulty and Oral airway inserted - appropriate to patient size Laryngoscope Size: Sabra Heck and 2 Grade View: Grade I Tube type: Oral Tube size: 8.0 mm Number of attempts: 1 Airway Equipment and Method: Stylet and Oral airway Placement Confirmation: ETT inserted through vocal cords under direct vision, positive ETCO2 and breath sounds checked- equal and bilateral Secured at: 23 cm Tube secured with: Tape Dental Injury: Teeth and Oropharynx as per pre-operative assessment

## 2021-07-15 NOTE — Progress Notes (Signed)
Pre Procedure note for inpatients:   Brian Sullivan has been scheduled for Procedure(s): CORONARY ARTERY BYPASS GRAFTING (CABG) (N/A) TRANSESOPHAGEAL ECHOCARDIOGRAM (TEE) (N/A) today. The various methods of treatment have been discussed with the patient. After consideration of the risks, benefits and treatment options the patient has consented to the planned procedure.   The patient has been seen and labs reviewed. There are no changes in the patients condition to prevent proceeding with the planned procedure today.  Recent labs:  Lab Results  Component Value Date   WBC 6.4 07/15/2021   HGB 12.8 (L) 07/15/2021   HCT 39.6 07/15/2021   PLT 131 (L) 07/15/2021   GLUCOSE 99 07/15/2021   CHOL 191 07/12/2021   TRIG 140 07/12/2021   HDL 45 07/12/2021   LDLCALC 118 (H) 07/12/2021   ALT 17 07/14/2021   AST 18 07/14/2021   NA 137 07/15/2021   K 3.9 07/15/2021   CL 105 07/15/2021   CREATININE 0.94 07/15/2021   BUN 12 07/15/2021   CO2 25 07/15/2021   TSH 1.682 07/14/2021   INR 1.0 07/12/2021   HGBA1C 5.4 07/14/2021    Dahlia Byes, MD 07/15/2021 7:28 AM

## 2021-07-15 NOTE — Anesthesia Procedure Notes (Signed)
Central Venous Catheter Insertion Performed by: Albertha Ghee, MD, anesthesiologist Start/End1/12/2021 7:16 AM, 07/15/2021 7:18 AM Patient location: Pre-op. Preanesthetic checklist: patient identified, IV checked, site marked, risks and benefits discussed, surgical consent, monitors and equipment checked, pre-op evaluation, timeout performed and anesthesia consent Hand hygiene performed  and maximum sterile barriers used  PA cath was placed.Swan type:thermodilution Procedure performed without using ultrasound guided technique. Attempts: 1 Patient tolerated the procedure well with no immediate complications.

## 2021-07-15 NOTE — Progress Notes (Signed)
EVENING ROUNDS NOTE :     [address]       ,[place] [address]             [phone]                 Day of Surgery Procedure(s) (LRB): CORONARY ARTERY BYPASS GRAFTING (CABG) X 3, USING LEFT INTERNAL MAMMARY ARTERY, ENDOSCOPICALLY HARVESTED RIGHT GREATER SAPHENOUS VEIN (N/A) TRANSESOPHAGEAL ECHOCARDIOGRAM (TEE) (N/A) ENDOVEIN HARVEST OF GREATER SAPHENOUS VEIN (Right)   Total Length of Stay:  LOS: 3 days  Events:   No events Low CT output    BP (!) 87/65    Pulse 80    Temp 98.8 F (37.1 C)    Resp 16    Ht 5\' 11"  (1.803 m)    Wt 92.1 kg    SpO2 96%    BMI 28.31 kg/m   PAP: (28-38)/(13-21) 33/21 CO:  [4.1 L/min-4.9 L/min] 4.1 L/min CI:  [1.9 L/min/m2-2 L/min/m2] 1.9 L/min/m2  Vent Mode: SIMV;PRVC;PSV FiO2 (%):  [40 %-50 %] 40 % Set Rate:  [4 bmp-12 bmp] 4 bmp Vt Set:  [600 mL] 600 mL PEEP:  [5 cmH20] 5 cmH20 Pressure Support:  [10 cmH20] 10 cmH20 Plateau Pressure:  [19 cmH20] 19 cmH20   sodium chloride     [START ON 07/16/2021] sodium chloride     sodium chloride 10 mL/hr at 07/15/21 2108   albumin human 12.5 g (07/15/21 1725)    ceFAZolin (ANCEF) IV     dexmedetomidine (PRECEDEX) IV infusion Stopped (07/15/21 2025)   famotidine (PEPCID) IV 20 mg (07/15/21 2122)   insulin 1.8 Units/hr (07/15/21 2100)   lactated ringers 20 mL/hr at 07/15/21 1900   lactated ringers 10 mL/hr at 07/15/21 2109   milrinone 0.25 mcg/kg/min (07/15/21 2100)   nitroGLYCERIN 0 mcg/min (07/15/21 1636)   phenylephrine (NEO-SYNEPHRINE) Adult infusion 30 mcg/min (07/15/21 2100)   vancomycin 1,000 mg (07/15/21 2118)    I/O last 3 completed shifts: In: 5220.4 [P.O.:480; I.V.:2712.2; Blood:903; IV Piggyback:1125.1] Out: 0488 [Urine:5085; Blood:612; Chest Tube:148]   CBC Latest Ref Rng & Units 07/15/2021 07/15/2021 07/15/2021  WBC 4.0 - 10.5 K/uL 12.0(H) - -  Hemoglobin 13.0 - 17.0 g/dL 10.2(L) 9.5(L) 8.5(L)  Hematocrit 39.0 - 52.0 % 31.0(L) 28.0(L) 25.0(L)  Platelets 150 - 400  K/uL PLATELET CLUMPS NOTED ON SMEAR, UNABLE TO ESTIMATE - -    BMP Latest Ref Rng & Units 07/15/2021 07/15/2021 07/15/2021  Glucose 70 - 99 mg/dL - 109(H) -  BUN 8 - 23 mg/dL - 8 -  Creatinine 0.61 - 1.24 mg/dL - 0.60(L) -  Sodium 135 - 145 mmol/L 138 137 136  Potassium 3.5 - 5.1 mmol/L 4.1 4.0 4.5  Chloride 98 - 111 mmol/L - 101 -  CO2 22 - 32 mmol/L - - -  Calcium 8.9 - 10.3 mg/dL - - -    ABG    Component Value Date/Time   PHART 7.433 07/15/2021 1253   PCO2ART 35.5 07/15/2021 1253   PO2ART 217 (H) 07/15/2021 1253   HCO3 23.8 07/15/2021 1253   TCO2 25 07/15/2021 1253   O2SAT 100.0 07/15/2021 1253       Melodie Bouillon, MD 07/15/2021 9:30 PM

## 2021-07-15 NOTE — Op Note (Addendum)
Brian Sullivan, Brian Sullivan MEDICAL RECORD NO: [id number] ACCOUNT NO: [id number] DATE OF BIRTH: [birth year] FACILITY: [facility] LOCATION: [facility] PHYSICIAN: Ivin Poot III, MD  Operative Report   DATE OF PROCEDURE: 07/15/2021   PROCEDURES PERFORMED:   1.  Coronary artery bypass grafting x3 (left internal mammary artery to LAD, saphenous vein graft to distal RCA, saphenous vein graft to ramus intermediate).  2.  Endoscopic harvest of right leg greater saphenous vein.  SURGEON:  Len Childs, MD  FIRST ASSISTANT: Enid Cutter, PA-C And experienced assistant was required for the procedure given the standard of surgical care and the complexity of the operation.  The first assistant was needed for exposure, dissection, retraction, accurate suctioning, and keeping anastomotic sutures properly aligned.  ANESTHESIA:  General by Dr. Albertha Ghee.  PREOPERATIVE DIAGNOSIS:  Severe multivessel coronary artery disease with acute coronary syndrome and unstable angina.  POSTOPERATIVE DIAGNOSIS:  Severe multivessel coronary artery disease with acute coronary syndrome and unstable angina.  CLINICAL NOTE:  The patient is a 76 year old obese male who was admitted through the Emergency Room when he developed severe chest pain while shopping with his wife.  He had associated weakness, shortness of breath and diaphoresis.  He was brought  directly to the Emergency Department.  He may have had some associated atrial fibrillation before he reached the ED.  He was stabilized and admitted to Cardiology.  The EKG showed no acute changes with sinus rhythm.  His immediate cardiac enzymes were  minimally elevated.  He was given aspirin and started on IV heparin and remained pain free.  The next day, he underwent both echocardiogram and cardiac catheterization, which demonstrated severe multivessel coronary disease.  He had proximal 95% stenosis  of the dominant, large RCA with a patent stent, which had been placed  18 years ago distal to the stenosis.  He also had a mid LAD 85% stenosis and disease of a small atretic circumflex system.  The ramus intermediate had an 80% stenosis and it was a  smaller vessel.  Overall, LV systolic function was preserved.  An echocardiogram demonstrated normal LV systolic function.  The aortic valve was calcified, but did not have significant aortic stenosis.  The transvalvular gradient of the aortic valve at  the time of a cardiac catheterization was less than 10 mmHg.  The patient was recommended for coronary artery bypass grafting as his best therapy for his severe multivessel coronary disease and presentation with acute accelerating angina.  I talked to the patient and examined the patient after reviewing his  echocardiogram and coronary arteriograms.  I discussed the procedure of CABG and agreed with this procedure as his best long-term therapy for his significant coronary artery disease. With the family and the patient I discussed the major aspects of the  procedure including use of general anesthesia and cardiopulmonary bypass, the location of the surgical incisions, and the expected postoperative hospital recovery period.  I discussed with the patient and wife the risks to him on surgery including the  risks of stroke, bleeding, blood transfusion requirement, infection, organ failure, postoperative pulmonary effusions, and death.  He demonstrated his understanding and agreed to proceed with surgery and what I felt was an informed consent.  OPERATIVE FINDINGS:   1.  Adequate conduit, saphenous vein removed endoscopically from the right leg. 2.  Severely diseased coronary vessels with a large right coronary artery and a small 1.5 mm LAD and a small 1 mm ramus, which were small, but adequate for grafting. 3.  The patient separated from cardiopulmonary bypass with normal hemodynamics and remained stable.  The TEE intraoperatively also demonstrated no significant aortic valvular  stenosis, even though the valve leaflets had calcium.  DESCRIPTION OF PROCEDURE:  The patient was brought from preoperative holding where informed consent had been documented and final issues addressed with the patient.  He was brought to the operating room and placed supine on the operating table.  General  anesthesia was induced under invasive hemodynamic monitoring and he remained stable.  A transesophageal echo probe was placed by the anesthesia team.  The patient was then prepped and draped as a sterile field and a proper time-out was performed.  A  sternal incision was made, the saphenous vein was harvested from the right leg by the first assistant using endoscopic vein harvest technique.  The leg incision closure was subsequently performed by the first assistant.  The left internal mammary artery was harvested as a pedicle graft from its origin at the subclavian vessels.  It was 1.5 mm vessel  with good flow.  The sternal retractor was then placed and the pericardium was opened and suspended.  There is a large amount of mediastinal fat.  Pursestrings were placed in the ascending aorta and right atrium and after heparin was administered, the  ACT was documented as being therapeutic.  The patient was then cannulated and placed on bypass.  The coronaries were identified for grafting.  The distal RCA, LAD, and ramus intermediate were found to be adequate targets beyond the area of stenosis.  The  mammary artery and vein were prepared for the distal anastomoses and cardioplegia cannula was replaced for both antegrade and retrograde cold blood cardioplegia.  The patient was cooled to 32 degrees and the aortic cross-clamp was applied.  1 liter of  cold blood cardioplegia was delivered in split doses between the antegrade aortic and retrograde coronary sinus catheters.  There was good cardioplegic arrest and subsequent temperature dropped less than 12 degrees.  Cardioplegia was delivered every  20-30  minutes.  The distal coronary anastomoses were performed.  The first distal anastomosis was the large distal RCA.  The anastomosis was placed just distal to the previously placed stent.  The vessel was a 2 mm vessel, it had proximal 95% stenosis.  A reverse  saphenous vein was sewn end-to-side with running 7-0 Prolene and there was good flow through the graft.  Cardioplegia was redosed.  The second distal anastomosis was to the ramus intermedius branch of the left coronary.  There was a smaller 1 mm vessel with proximal 70-80% stenosis.  A reverse saphenous vein was sewn end-to-side with running 7-0 Prolene and there was good flow  through the graft.  Cardioplegia was redosed.  A third distal anastomosis was of the mid LAD.  This was a 1.5 mm vessel, heavily diseased.  The left IMA pedicle was brought through an opening and the left lateral pericardium was brought down onto the LAD  and sewn end-to-side with a running 8-0 Prolene.  There was good flow through the anastomosis after briefly releasing the pedicle bulldog and the mammary artery.  The bulldog was reapplied and the pedicle secured to the epicardium.  Cardioplegia was  redosed.  While the crossclamp was still in place 2 proximal vein anastomoses were performed on the ascending aorta using a 4.5 mm punch and running 6-0 Prolene.  Prior to tying down the final proximal anastomosis, air was vented from the coronaries with a dose of  retrograde warm  blood cardioplegia.  Cross clamp was removed.  The heart returned to a regular sinus rhythm without cardioversion.  The vein grafts were de-aired and opened.  He had good flow and hemostasis was documented at the proximal and distal anastomosis of the coronary bypass grafts.  The patient was rewarmed  and reperfused, temporary pacing wires were applied.  Lungs were reexpanded and the ventilator was resumed.  The patient was then weaned from cardiopulmonary bypass without difficulty and requiring no  significant inotropic support.  Echo showed  preserved LV function.  Protamine was administered without adverse reaction.  There was adequate hemostasis after extended period of time of working on the sternum and the mediastinal fat and general oozing to improve hemostasis.  The patient was only  given a transfusion of FFP.  The superior pericardial fat was closed over the aorta and vein grafts.  Anterior mediastinal tube and left pleural chest tube were placed, brought out through separate incisions.  The sternum was then closed with interrupted steel wire.  The patient  remained stable.  Pectoralis fascia was closed with a running #1 Vicryl.  The subcutaneous and skin layers were closed with running Vicryl and sterile dressings were then applied.  The patient was then transported back to the ICU in stable condition.   Total cardiopulmonary bypass time was 130 minutes.     SUJ D: 07/15/2021 3:26:42 pm T: 07/15/2021 11:30:00 pm  JOB: [id number]/ [id number]

## 2021-07-15 NOTE — Plan of Care (Signed)
?  Problem: Clinical Measurements: ?Goal: Ability to maintain clinical measurements within normal limits will improve ?Outcome: Progressing ?Goal: Will remain free from infection ?Outcome: Progressing ?Goal: Diagnostic test results will improve ?Outcome: Progressing ?  ?

## 2021-07-15 NOTE — Brief Op Note (Addendum)
07/11/2021 - 07/15/2021  11:56 AM  PATIENT:  Brian Sullivan  76 y.o. male  PRE-OPERATIVE DIAGNOSIS:  CORONARY ARTERY DISEASE  POST-OPERATIVE DIAGNOSIS:  CORONARY ARTERY DISEASE  PROCEDURE:   CORONARY ARTERY BYPASS GRAFTING (CABG) TIMES , USING LEFT INTERNAL MAMMARY ARTERY, ENDOSCOPICALLY HARVESTED RIGHT GREATER SAPHENOUS VEIN   LIMA-LAD SVG-Ramus Int. SVG-PDA  Vein harvest time: 48min      Vein prep time:  52min  TRANSESOPHAGEAL ECHOCARDIOGRAM   ENDOVEIN HARVEST OF GREATER SAPHENOUS VEIN (Right)  SURGEON:  Dahlia Byes, MD - Primary  PHYSICIAN ASSISTANT: Caeleb Batalla  ASSISTANTS: Thelma Barge, RN, RN First Assistant   ANESTHESIA:   general  BLOOD ADMINISTERED: FFP x 2 units  EBL:  654ml  DRAINS:  Left pleural and mediastinal drains    LOCAL MEDICATIONS USED:  NONE  SPECIMEN:  No Specimen  DISPOSITION OF SPECIMEN:  N/A  COUNTS:  YES  DICTATION: .Dragon Dictation  PLAN OF CARE: Admit to inpatient   PATIENT DISPOSITION:  ICU - intubated and hemodynamically stable.   Delay start of Pharmacological VTE agent (>24hrs) due to surgical blood loss or risk of bleeding: yes

## 2021-07-15 NOTE — Procedures (Signed)
Extubation Procedure Note Pt extubated following Cardiac wean.  VC 1.0L NIF -30 Cuff leak + ABG acceptable No stridor.   Patient Details:   Name: Brian Sullivan DOB: 1945/09/30 MRN: [id number]   Airway Documentation:    Vent end date: 07/15/21 Vent end time: 2145   Evaluation  O2 sats: stable throughout Complications: No apparent complications Patient did tolerate procedure well. Bilateral Breath Sounds: Clear, Diminished   Yes  Marissa Nestle 07/15/2021, 9:49 PM

## 2021-07-15 NOTE — Anesthesia Procedure Notes (Signed)
Arterial Line Insertion Start/End1/12/2021 7:00 AM, 07/15/2021 7:09 AM Performed by: Betha Loa, CRNA, CRNA  Patient location: Pre-op. Preanesthetic checklist: patient identified, IV checked, site marked, risks and benefits discussed, surgical consent, monitors and equipment checked, pre-op evaluation and anesthesia consent Lidocaine 1% used for infiltration Right, radial was placed Catheter size: 20 G Hand hygiene performed  and maximum sterile barriers used   Attempts: 1 Procedure performed without using ultrasound guided technique. Ultrasound Notes:anatomy identified, needle tip was noted to be adjacent to the nerve/plexus identified and no ultrasound evidence of intravascular and/or intraneural injection Following insertion, dressing applied and Biopatch. Post procedure assessment: normal and unchanged  Patient tolerated the procedure well with no immediate complications.

## 2021-07-15 NOTE — Anesthesia Procedure Notes (Signed)
Central Venous Catheter Insertion Performed by: Albertha Ghee, MD, anesthesiologist Start/End1/12/2021 7:05 AM, 07/15/2021 7:17 AM Patient location: Pre-op. Preanesthetic checklist: patient identified, IV checked, site marked, risks and benefits discussed, surgical consent, monitors and equipment checked, pre-op evaluation, timeout performed and anesthesia consent Lidocaine 1% used for infiltration and patient sedated Hand hygiene performed  and maximum sterile barriers used  Catheter size: 8.5 Fr Central line was placed.Sheath introducer Procedure performed using ultrasound guided technique. Ultrasound Notes:anatomy identified, needle tip was noted to be adjacent to the nerve/plexus identified, no ultrasound evidence of intravascular and/or intraneural injection and image(s) printed for medical record Attempts: 1 Following insertion, line sutured and dressing applied. Post procedure assessment: blood return through all ports, free fluid flow and no air  Patient tolerated the procedure well with no immediate complications.

## 2021-07-15 NOTE — Progress Notes (Signed)
Assisted tele visit to patient with wife.  Ajee Heasley Harold, RN  

## 2021-07-15 NOTE — Transfer of Care (Signed)
Immediate Anesthesia Transfer of Care Note  Patient: Brian Sullivan  Procedure(s) Performed: CORONARY ARTERY BYPASS GRAFTING (CABG) X 3, USING LEFT INTERNAL MAMMARY ARTERY, ENDOSCOPICALLY HARVESTED RIGHT GREATER SAPHENOUS VEIN (Chest) TRANSESOPHAGEAL ECHOCARDIOGRAM (TEE) ENDOVEIN HARVEST OF GREATER SAPHENOUS VEIN (Right: Leg Lower)  Patient Location: ICU  Anesthesia Type:General  Level of Consciousness: sedated, unresponsive and Patient remains intubated per anesthesia plan  Airway & Oxygen Therapy: Patient remains intubated per anesthesia plan and Patient placed on Ventilator (see vital sign flow sheet for setting)  Post-op Assessment: Report given to RN and Post -op Vital signs reviewed and stable  Post vital signs: Reviewed and stable  Last Vitals:  Vitals Value Taken Time  BP 135/78   Temp 35.3 C 07/15/21 1423  Pulse 69 07/15/21 1423  Resp 12 07/15/21 1423  SpO2 100 % 07/15/21 1423  Vitals shown include unvalidated device data.  Last Pain:  Vitals:   07/15/21 0534  TempSrc: Oral  PainSc:          Complications: No notable events documented.

## 2021-07-15 NOTE — Progress Notes (Signed)
°  Echocardiogram 2D Echocardiogram has been performed.  Bobbye Charleston 07/15/2021, 8:26 AM

## 2021-07-15 NOTE — Anesthesia Preprocedure Evaluation (Signed)
Anesthesia Evaluation  Patient identified by MRN, date of birth, ID band Patient awake    Reviewed: Allergy & Precautions, H&P , NPO status , Patient's Chart, lab work & pertinent test results  Airway Mallampati: II   Neck ROM: full    Dental   Pulmonary former smoker,    breath sounds clear to auscultation       Cardiovascular hypertension, + angina + CAD  + Valvular Problems/Murmurs  Rhythm:regular Rate:Normal     Neuro/Psych  Headaches, PSYCHIATRIC DISORDERS Anxiety    GI/Hepatic GERD  ,  Endo/Other    Renal/GU      Musculoskeletal   Abdominal   Peds  Hematology   Anesthesia Other Findings   Reproductive/Obstetrics                             Anesthesia Physical Anesthesia Plan  ASA: 3  Anesthesia Plan: General   Post-op Pain Management:    Induction: Intravenous  PONV Risk Score and Plan: 2 and Ondansetron, Dexamethasone, Midazolam and Treatment may vary due to age or medical condition  Airway Management Planned: Oral ETT  Additional Equipment: Arterial line, TEE, Ultrasound Guidance Line Placement, PA Cath and CVP  Intra-op Plan:   Post-operative Plan: Post-operative intubation/ventilation  Informed Consent: I have reviewed the patients History and Physical, chart, labs and discussed the procedure including the risks, benefits and alternatives for the proposed anesthesia with the patient or authorized representative who has indicated his/her understanding and acceptance.     Dental advisory given  Plan Discussed with: CRNA, Anesthesiologist and Surgeon  Anesthesia Plan Comments:         Anesthesia Quick Evaluation

## 2021-07-16 ENCOUNTER — Inpatient Hospital Stay (HOSPITAL_COMMUNITY): Payer: Medicare Other

## 2021-07-16 LAB — BASIC METABOLIC PANEL
Anion gap: 3 — ABNORMAL LOW (ref 5–15)
Anion gap: 8 (ref 5–15)
BUN: 9 mg/dL (ref 8–23)
BUN: 9 mg/dL (ref 8–23)
CO2: 22 mmol/L (ref 22–32)
CO2: 25 mmol/L (ref 22–32)
Calcium: 7.9 mg/dL — ABNORMAL LOW (ref 8.9–10.3)
Calcium: 8.3 mg/dL — ABNORMAL LOW (ref 8.9–10.3)
Chloride: 108 mmol/L (ref 98–111)
Chloride: 104 mmol/L (ref 98–111)
Creatinine, Ser: 0.97 mg/dL (ref 0.61–1.24)
Creatinine, Ser: 1.13 mg/dL (ref 0.61–1.24)
GFR, Estimated: >60 mL/min (ref >60)
GFR, Estimated: >60 mL/min (ref >60)
Glucose, Bld: 131 mg/dL — ABNORMAL HIGH (ref 70–99)
Glucose, Bld: 147 mg/dL — ABNORMAL HIGH (ref 70–99)
Potassium: 4.5 mmol/L (ref 3.5–5.1)
Potassium: 4 mmol/L (ref 3.5–5.1)
Sodium: 133 mmol/L — ABNORMAL LOW (ref 135–145)
Sodium: 137 mmol/L (ref 135–145)

## 2021-07-16 LAB — GLUCOSE, CAPILLARY
Glucose-Capillary: 114 mg/dL — ABNORMAL HIGH (ref 70–99)
Glucose-Capillary: 114 mg/dL — ABNORMAL HIGH (ref 70–99)
Glucose-Capillary: 123 mg/dL — ABNORMAL HIGH (ref 70–99)
Glucose-Capillary: 128 mg/dL — ABNORMAL HIGH (ref 70–99)
Glucose-Capillary: 130 mg/dL — ABNORMAL HIGH (ref 70–99)
Glucose-Capillary: 131 mg/dL — ABNORMAL HIGH (ref 70–99)
Glucose-Capillary: 133 mg/dL — ABNORMAL HIGH (ref 70–99)
Glucose-Capillary: 138 mg/dL — ABNORMAL HIGH (ref 70–99)
Glucose-Capillary: 139 mg/dL — ABNORMAL HIGH (ref 70–99)
Glucose-Capillary: 140 mg/dL — ABNORMAL HIGH (ref 70–99)
Glucose-Capillary: 150 mg/dL — ABNORMAL HIGH (ref 70–99)

## 2021-07-16 LAB — CBC
HCT: 30.5 % — ABNORMAL LOW (ref 39.0–52.0)
HCT: 32.7 % — ABNORMAL LOW (ref 39.0–52.0)
Hemoglobin: 10.2 g/dL — ABNORMAL LOW (ref 13.0–17.0)
Hemoglobin: 10.9 g/dL — ABNORMAL LOW (ref 13.0–17.0)
MCH: 30.6 pg (ref 26.0–34.0)
MCH: 30.6 pg (ref 26.0–34.0)
MCHC: 33.4 g/dL (ref 30.0–36.0)
MCHC: 33.3 g/dL (ref 30.0–36.0)
MCV: 91.6 fL (ref 80.0–100.0)
MCV: 91.9 fL (ref 80.0–100.0)
Platelets: 96 10*3/uL — ABNORMAL LOW (ref 150–400)
Platelets: 124 10*3/uL — ABNORMAL LOW (ref 150–400)
RBC: 3.33 MIL/uL — ABNORMAL LOW (ref 4.22–5.81)
RBC: 3.56 MIL/uL — ABNORMAL LOW (ref 4.22–5.81)
RDW: 12.9 % (ref 11.5–15.5)
RDW: 13.2 % (ref 11.5–15.5)
WBC: 11.6 10*3/uL — ABNORMAL HIGH (ref 4.0–10.5)
WBC: 16.1 10*3/uL — ABNORMAL HIGH (ref 4.0–10.5)
nRBC: 0 % (ref 0.0–0.2)
nRBC: 0 % (ref 0.0–0.2)

## 2021-07-16 LAB — PREPARE FRESH FROZEN PLASMA

## 2021-07-16 LAB — BPAM FFP
Blood Product Expiration Date: 202301112359
Blood Product Expiration Date: 202301112359
ISSUE DATE / TIME: 202301061252
ISSUE DATE / TIME: 202301061252
Unit Type and Rh: 7300
Unit Type and Rh: 7300

## 2021-07-16 LAB — ECHO INTRAOPERATIVE TEE
AV Mean grad: 9 mmHg
Height: 71 in
Weight: 3247.99 oz

## 2021-07-16 LAB — MAGNESIUM
Magnesium: 2.7 mg/dL — ABNORMAL HIGH (ref 1.7–2.4)
Magnesium: 2.5 mg/dL — ABNORMAL HIGH (ref 1.7–2.4)

## 2021-07-16 MED ORDER — INSULIN ASPART 100 UNIT/ML IJ SOLN
0.0000 [IU] | INTRAMUSCULAR | Status: DC
  Administered 2021-07-16: 2 [IU] via SUBCUTANEOUS

## 2021-07-16 MED ORDER — FUROSEMIDE 10 MG/ML IJ SOLN
20.0000 mg | Freq: Two times a day (BID) | INTRAMUSCULAR | Status: DC
  Administered 2021-07-16 (×2): 20 mg via INTRAVENOUS
  Filled 2021-07-16 (×3): qty 2

## 2021-07-16 MED ORDER — ALUM & MAG HYDROXIDE-SIMETH 200-200-20 MG/5ML PO SUSP
15.0000 mL | Freq: Four times a day (QID) | ORAL | Status: DC | PRN
  Administered 2021-07-16 – 2021-07-19 (×5): 15 mL via ORAL
  Filled 2021-07-16 (×5): qty 30

## 2021-07-16 MED ORDER — AMITRIPTYLINE HCL 50 MG PO TABS
25.0000 mg | ORAL_TABLET | Freq: Every day | ORAL | Status: DC
  Administered 2021-07-16 – 2021-07-20 (×5): 25 mg via ORAL
  Filled 2021-07-16 (×6): qty 1

## 2021-07-16 MED ORDER — OXYCODONE HCL 5 MG PO TABS: 5.0000 mg | ORAL_TABLET | ORAL | Status: DC | PRN

## 2021-07-16 NOTE — Anesthesia Postprocedure Evaluation (Signed)
Anesthesia Post Note  Patient: Brian Sullivan  Procedure(s) Performed: CORONARY ARTERY BYPASS GRAFTING (CABG) X 3, USING LEFT INTERNAL MAMMARY ARTERY, ENDOSCOPICALLY HARVESTED RIGHT GREATER SAPHENOUS VEIN (Chest) TRANSESOPHAGEAL ECHOCARDIOGRAM (TEE) ENDOVEIN HARVEST OF GREATER SAPHENOUS VEIN (Right: Leg Lower)     Patient location during evaluation: SICU Anesthesia Type: General Level of consciousness: sedated Pain management: pain level controlled Vital Signs Assessment: post-procedure vital signs reviewed and stable Respiratory status: patient remains intubated per anesthesia plan Cardiovascular status: stable Postop Assessment: no apparent nausea or vomiting Anesthetic complications: no   No notable events documented.  Last Vitals:  Vitals:   07/16/21 0700 07/16/21 0715  BP: 115/68   Pulse: 83 88  Resp: 13 14  Temp: 37.1 C 37.1 C  SpO2: 96% 96%    Last Pain:  Vitals:   07/16/21 0549  TempSrc:   PainSc: 0-No pain                 Lujean Ebright S

## 2021-07-16 NOTE — Progress Notes (Addendum)
1 Day Post-Op Procedure(s) (LRB): CORONARY ARTERY BYPASS GRAFTING (CABG) X 3, USING LEFT INTERNAL MAMMARY ARTERY, ENDOSCOPICALLY HARVESTED RIGHT GREATER SAPHENOUS VEIN (N/A) TRANSESOPHAGEAL ECHOCARDIOGRAM (TEE) (N/A) ENDOVEIN HARVEST OF GREATER SAPHENOUS VEIN (Right) Subjective: Up in chair, mild surgical pain Nsr on low dose milrinone Objective: Vital signs in last 24 hours: Temp:  [95.4 F (35.2 C)-99.3 F (37.4 C)] 98.8 F (37.1 C) (01/07 0800) Pulse Rate:  [70-94] 85 (01/07 0800) Cardiac Rhythm: Normal sinus rhythm (01/07 0400) Resp:  [12-35] 15 (01/07 0800) BP: (87-124)/(63-83) 110/64 (01/07 0800) SpO2:  [89 %-100 %] 96 % (01/07 0800) FiO2 (%):  [40 %-50 %] 40 % (01/06 2108) Weight:  [97 kg] 97 kg (01/07 0500)  Hemodynamic parameters for last 24 hours: PAP: (28-55)/(13-25) 31/17 CO:  [4.1 L/min-5.9 L/min] 5.9 L/min CI:  [1.9 L/min/m2-2.8 L/min/m2] 2.8 L/min/m2  Intake/Output from previous day: 01/06 0701 - 01/07 0700 In: 5980 [P.O.:300; I.V.:2712.3; Blood:903; IV Piggyback:2064.7] Out: 5580 [Urine:4470; Emesis/NG output:10; Blood:612; Chest Tube:488] Intake/Output this shift: Total I/O In: -  Out: 75 [Urine:75]       Exam    General- alert and comfortable in chair    Neck- no JVD, no cervical adenopathy palpable, no carotid bruit   Lungs- clear without rales, wheezes- no air leak from tubes   Cor- regular rate and rhythm, no murmur , gallop   Abdomen- soft, non-tender   Extremities - warm, non-tender, minimal edema   Neuro- oriented, appropriate, no focal weakness   Lab Results: Recent Labs    07/15/21 2030 07/15/21 2138 07/15/21 2301 07/16/21 0423  WBC 15.0*  --   --  11.6*  HGB 11.1*   < > 10.2* 10.2*  HCT 32.6*   < > 30.0* 30.5*  PLT 130*  --   --  96*   < > = values in this interval not displayed.   BMET:  Recent Labs    07/15/21 2030 07/15/21 2138 07/15/21 2301 07/16/21 0423  NA 135   < > 138 133*  K 4.5   < > 4.4 4.5  CL 106  --   --   108  CO2 23  --   --  22  GLUCOSE 150*  --   --  131*  BUN 10  --   --  9  CREATININE 0.91  --   --  0.97  CALCIUM 7.4*  --   --  7.9*   < > = values in this interval not displayed.    PT/INR:  Recent Labs    07/15/21 1431  LABPROT 17.8*  INR 1.5*   ABG    Component Value Date/Time   PHART 7.325 (L) 07/15/2021 2301   HCO3 22.4 07/15/2021 2301   TCO2 24 07/15/2021 2301   ACIDBASEDEF 4.0 (H) 07/15/2021 2301   O2SAT 91.0 07/15/2021 2301   CBG (last 3)  Recent Labs    07/16/21 0407 07/16/21 0605 07/16/21 0740  GLUCAP 130* 150* 139*    Assessment/Plan: S/P Procedure(s) (LRB): CORONARY ARTERY BYPASS GRAFTING (CABG) X 3, USING LEFT INTERNAL MAMMARY ARTERY, ENDOSCOPICALLY HARVESTED RIGHT GREATER SAPHENOUS VEIN (N/A) TRANSESOPHAGEAL ECHOCARDIOGRAM (TEE) (N/A) ENDOVEIN HARVEST OF GREATER SAPHENOUS VEIN (Right) Progression orders placed\patients condition and plan of care d/w wife Diuresis DC swan, a-line PT consult placed   LOS: 4 days    Dahlia Byes 07/16/2021

## 2021-07-17 ENCOUNTER — Inpatient Hospital Stay (HOSPITAL_COMMUNITY): Payer: Medicare Other

## 2021-07-17 ENCOUNTER — Encounter (HOSPITAL_COMMUNITY): Payer: Self-pay | Admitting: Cardiothoracic Surgery

## 2021-07-17 LAB — BASIC METABOLIC PANEL
Anion gap: 11 (ref 5–15)
BUN: 11 mg/dL (ref 8–23)
CO2: 25 mmol/L (ref 22–32)
Calcium: 8.8 mg/dL — ABNORMAL LOW (ref 8.9–10.3)
Chloride: 98 mmol/L (ref 98–111)
Creatinine, Ser: 1.03 mg/dL (ref 0.61–1.24)
GFR, Estimated: >60 mL/min (ref >60)
Glucose, Bld: 124 mg/dL — ABNORMAL HIGH (ref 70–99)
Potassium: 3.8 mmol/L (ref 3.5–5.1)
Sodium: 134 mmol/L — ABNORMAL LOW (ref 135–145)

## 2021-07-17 LAB — GLUCOSE, CAPILLARY
Glucose-Capillary: 105 mg/dL — ABNORMAL HIGH (ref 70–99)
Glucose-Capillary: 117 mg/dL — ABNORMAL HIGH (ref 70–99)
Glucose-Capillary: 96 mg/dL (ref 70–99)
Glucose-Capillary: 104 mg/dL — ABNORMAL HIGH (ref 70–99)
Glucose-Capillary: 103 mg/dL — ABNORMAL HIGH (ref 70–99)

## 2021-07-17 LAB — CBC
HCT: 36.1 % — ABNORMAL LOW (ref 39.0–52.0)
Hemoglobin: 11.8 g/dL — ABNORMAL LOW (ref 13.0–17.0)
MCH: 30.2 pg (ref 26.0–34.0)
MCHC: 32.7 g/dL (ref 30.0–36.0)
MCV: 92.3 fL (ref 80.0–100.0)
Platelets: 127 10*3/uL — ABNORMAL LOW (ref 150–400)
RBC: 3.91 MIL/uL — ABNORMAL LOW (ref 4.22–5.81)
RDW: 13.4 % (ref 11.5–15.5)
WBC: 15 10*3/uL — ABNORMAL HIGH (ref 4.0–10.5)
nRBC: 0 % (ref 0.0–0.2)

## 2021-07-17 MED ORDER — SODIUM CHLORIDE 0.9% FLUSH: 3.0000 mL | INTRAVENOUS | Status: DC | PRN

## 2021-07-17 MED ORDER — INSULIN ASPART 100 UNIT/ML IJ SOLN: 0.0000 [IU] | Freq: Three times a day (TID) | INTRAMUSCULAR | Status: DC

## 2021-07-17 MED ORDER — AMIODARONE HCL IN DEXTROSE 360-4.14 MG/200ML-% IV SOLN
60.0000 mg/h | INTRAVENOUS | Status: DC
  Administered 2021-07-17: 60 mg/h via INTRAVENOUS

## 2021-07-17 MED ORDER — SODIUM CHLORIDE 0.9% FLUSH
3.0000 mL | Freq: Two times a day (BID) | INTRAVENOUS | Status: DC
  Administered 2021-07-17 (×2): 3 mL via INTRAVENOUS

## 2021-07-17 MED ORDER — AMIODARONE HCL IN DEXTROSE 360-4.14 MG/200ML-% IV SOLN
30.0000 mg/h | INTRAVENOUS | Status: DC
  Administered 2021-07-18 (×2): 30 mg/h via INTRAVENOUS
  Filled 2021-07-17 (×3): qty 200

## 2021-07-17 MED ORDER — AMIODARONE HCL IN DEXTROSE 360-4.14 MG/200ML-% IV SOLN
INTRAVENOUS | Status: AC
  Filled 2021-07-17: qty 200

## 2021-07-17 MED ORDER — ~~LOC~~ CARDIAC SURGERY, PATIENT & FAMILY EDUCATION: Freq: Once | Status: AC

## 2021-07-17 MED ORDER — POTASSIUM CHLORIDE CRYS ER 20 MEQ PO TBCR
40.0000 meq | EXTENDED_RELEASE_TABLET | Freq: Every day | ORAL | Status: DC
  Administered 2021-07-17: 40 meq via ORAL
  Filled 2021-07-17: qty 2

## 2021-07-17 MED ORDER — FUROSEMIDE 40 MG PO TABS
40.0000 mg | ORAL_TABLET | Freq: Every day | ORAL | Status: DC
  Administered 2021-07-17: 40 mg via ORAL
  Filled 2021-07-17: qty 1

## 2021-07-17 MED ORDER — AMIODARONE LOAD VIA INFUSION
150.0000 mg | Freq: Once | INTRAVENOUS | Status: AC
  Administered 2021-07-17: 150 mg via INTRAVENOUS
  Filled 2021-07-17: qty 83.34

## 2021-07-17 MED ORDER — SODIUM CHLORIDE 0.9 % IV SOLN: 250.0000 mL | INTRAVENOUS | Status: DC | PRN

## 2021-07-17 MED ORDER — METOPROLOL TARTRATE 25 MG PO TABS
25.0000 mg | ORAL_TABLET | Freq: Two times a day (BID) | ORAL | Status: DC
  Administered 2021-07-17 – 2021-07-19 (×6): 25 mg via ORAL
  Filled 2021-07-17 (×7): qty 1

## 2021-07-17 NOTE — Progress Notes (Signed)
° °   °  [address]       [place] [address]             [phone]                 2 Days Post-Op Procedure(s) (LRB): CORONARY ARTERY BYPASS GRAFTING (CABG) X 3, USING LEFT INTERNAL MAMMARY ARTERY, ENDOSCOPICALLY HARVESTED RIGHT GREATER SAPHENOUS VEIN (N/A) TRANSESOPHAGEAL ECHOCARDIOGRAM (TEE) (N/A) ENDOVEIN HARVEST OF GREATER SAPHENOUS VEIN (Right)   Events: No events Ambulated twice without difficulty _______________________________________________________________ Vitals: BP 102/70    Pulse 86    Temp 97.7 F (36.5 C)    Resp (!) 29    Ht 5\' 11"  (1.803 m)    Wt 97 kg    SpO2 96%    BMI 29.83 kg/m  Filed Weights   07/12/21 1529 07/16/21 0500 07/17/21 0500  Weight: 92.1 kg 97 kg 97 kg     - Neuro: alert NAD  - Cardiovascular: sinus  Drips: none.   PAP: (29-42)/(20-30) 42/30 CO:  [5.9 L/min] 5.9 L/min CI:  [2.8 L/min/m2] 2.8 L/min/m2  - Pulm: EWOB    ABG    Component Value Date/Time   PHART 7.325 (L) 07/15/2021 2301   PCO2ART 42.8 07/15/2021 2301   PO2ART 66 (L) 07/15/2021 2301   HCO3 22.4 07/15/2021 2301   TCO2 24 07/15/2021 2301   ACIDBASEDEF 4.0 (H) 07/15/2021 2301   O2SAT 91.0 07/15/2021 2301    - Abd: ND - Extremity: warm  .Intake/Output      01/07 0701 01/08 0700 01/08 0701 01/09 0700   P.O. 240    I.V. (mL/kg) 161.4 (1.7)    Blood     IV Piggyback 202.5    Total Intake(mL/kg) 603.9 (6.2)    Urine (mL/kg/hr) 1655 (0.7)    Emesis/NG output     Blood     Chest Tube 400    Total Output 2055    Net -1451.2            _______________________________________________________________ Labs: CBC Latest Ref Rng & Units 07/16/2021 07/16/2021 07/15/2021  WBC 4.0 - 10.5 K/uL 16.1(H) 11.6(H) -  Hemoglobin 13.0 - 17.0 g/dL 10.9(L) 10.2(L) 10.2(L)  Hematocrit 39.0 - 52.0 % 32.7(L) 30.5(L) 30.0(L)  Platelets 150 - 400 K/uL 124(L) 96(L) -   CMP Latest Ref Rng & Units 07/16/2021 07/16/2021 07/15/2021  Glucose 70 - 99 mg/dL 147(H) 131(H) -  BUN  8 - 23 mg/dL 9 9 -  Creatinine 0.61 - 1.24 mg/dL 1.13 0.97 -  Sodium 135 - 145 mmol/L 137 133(L) 138  Potassium 3.5 - 5.1 mmol/L 4.0 4.5 4.4  Chloride 98 - 111 mmol/L 104 108 -  CO2 22 - 32 mmol/L 25 22 -  Calcium 8.9 - 10.3 mg/dL 8.3(L) 7.9(L) -  Total Protein 6.5 - 8.1 g/dL - - -  Total Bilirubin 0.3 - 1.2 mg/dL - - -  Alkaline Phos 38 - 126 U/L - - -  AST 15 - 41 U/L - - -  ALT 0 - 44 U/L - - -    CXR: stable  _______________________________________________________________  Assessment and Plan: POD 2 s/p CABG  Neuro: pain controlled CV: increasing BB.  On statin, and aspirin. Will remove wires Pulm: will remove Cts.  Continue PulmHyg Renal: creat stable.  Continue diuresis GI: on diet Heme: stable ID: [email or web address] Endo: SSI Dispo: floor   Bralyn Espino O Audry Kauzlarich 07/17/2021 8:03 AM

## 2021-07-17 NOTE — Progress Notes (Signed)
Pt received from [place]. VSS. Telemetry applied. Pt an family oriented to room and unit. Call light in reach.  Clyde Canterbury, RN

## 2021-07-17 NOTE — Evaluation (Signed)
Physical Therapy Evaluation Patient Details Name: Brian Sullivan MRN: [id number] DOB: 1945/08/26 Today's Date: 07/17/2021  History of Present Illness  Pt is a 76 y.o. male admitted 1/2/23for ACS and probable NSTEMI. 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 S/p CABG x3 on 1/6. PMH includes CAD, HTN, IBS, cerebral palsy, h/o tobacco use, anxiety.   Clinical Impression  Pt presents with an overall decrease in functional mobility secondary to above. PTA, pt independent without DME, retired from work, lives with wife who is NP; pt endorses h/o fall due to vertigo. Today, pt mobilizing well with up to [place]; increased time performing bed mobility and transfers while maintaining sternal precautions. Pt motivated to participate and regain PLOF; expect him to progress well while admitted. Pt would benefit from continued acute PT services to maximize functional mobility and independence prior to d/c with HHPT services.    SpO2 94-99% on 2L O2 when pleth reliable Maintaining 95% on 1L O2 at rest   Recommendations for follow up therapy are one component of a multi-disciplinary discharge planning process, led by the attending physician.  Recommendations may be updated based on patient status, additional functional criteria and insurance authorization.  Follow Up Recommendations Home health PT    Assistance Recommended at Discharge Intermittent Supervision/Assistance     Equipment Recommendations  (TBD)  Recommendations for Other Services       Functional Status Assessment Patient has had a recent decline in their functional status and demonstrates the ability to make significant improvements in function in a reasonable and predictable amount of time.     Precautions / Restrictions Precautions Precautions: Fall;Sternal      Mobility  Bed Mobility Overal bed mobility: Needs Assistance Bed Mobility: Rolling;Sidelying to Sit Rolling: Supervision Sidelying to sit: Mod assist       General bed mobility  comments: Educ on log roll from partially elevated HOB, good ability to roll without cues, modA for trunk elevation from sidelying    Transfers Overall transfer level: Needs assistance Equipment used:  (eva walker) Transfers: Sit to/from Stand Sit to Stand: Mod assist           General transfer comment: Cues for sequencing, including hand placement for sternal precautions and use of momentum, modA for trunk elevation; poor eccentric control to sit    Ambulation/Gait Ambulation/Gait assistance: Min guard Gait Distance (Feet): 290 Feet Assistive device:  (eva walker) Gait Pattern/deviations: Step-through pattern;Decreased stride length;Drifts right/left;Trunk flexed Gait velocity: Decreased     General Gait Details: Slow, mostly steady gait with eva walker and min guard for balance; intermittent cues for upright posture; pt conversant entirety of walk, difficulty getting reliable pulse ox reading on 2-3L O2, minimal DOE noted  Stairs            Wheelchair Mobility    Modified Rankin (Stroke Patients Only)       Balance Overall balance assessment: Needs assistance   Sitting balance-Leahy Scale: Fair     Standing balance support: No upper extremity supported Standing balance-Leahy Scale: Fair Standing balance comment: can static stand without UE support, unable to accept challenge                             Pertinent Vitals/Pain Pain Assessment: Faces Faces Pain Scale: Hurts little more Pain Location: sternal incision with coughing Pain Descriptors / Indicators: Discomfort;Grimacing;Guarding Pain Intervention(s): Monitored during session (declined use of pillow for bracing)    Home Living Family/patient expects to be discharged  to:: Private residence Living Arrangements: Spouse/significant other Available Help at Discharge: Family;Available PRN/intermittently Type of Home: House Home Access: Stairs to enter   Entrance Stairs-Number of Steps:  1   Home Layout: One level Home Equipment: Chartered certified accountant;Shower seat;Grab bars - tub/shower Additional Comments: Wife works as Urology NP; likely to have daughter stay with pt when wife has to return to work    Prior Function Prior Level of Function : Independent/Modified Independent;Driving;History of Falls (last six months)             Mobility Comments: Typically independent without DME; endores falls "will happen randomly" which pt attributes to vertigo. Walks daily to [employer] (on [address]), drives to lunch daily, enjoys watching [employer] ADLs Comments: Reports indep with ADL tasks, stands to shower but has a seat if needed     Hand Dominance        Extremity/Trunk Assessment   Upper Extremity Assessment Upper Extremity Assessment: Generalized weakness    Lower Extremity Assessment Lower Extremity Assessment: Generalized weakness       Communication   Communication: No difficulties  Cognition Arousal/Alertness: Awake/alert Behavior During Therapy: WFL for tasks assessed/performed Overall Cognitive Status: Within Functional Limits for tasks assessed                                 General Comments: WFL for simple tasks, not formally assessed        General Comments General comments (skin integrity, edema, etc.): Pt's wife present and supportive. Difficulty getting reliable pulse ox reading on 2-3L O2, minimal DOE noted    Exercises     Assessment/Plan    PT Assessment Patient needs continued PT services  PT Problem List Decreased strength;Decreased activity tolerance;Decreased balance;Decreased mobility;Decreased knowledge of use of DME;Decreased knowledge of precautions;Cardiopulmonary status limiting activity       PT Treatment Interventions DME instruction;Gait training;Stair training;Functional mobility training;Therapeutic activities;Therapeutic exercise;Balance training;Patient/family education    PT Goals (Current goals can be  found in the Care Plan section)  Acute Rehab PT Goals Patient Stated Goal: Regain independence, interested in joining the [facility] PT Goal Formulation: With patient Time For Goal Achievement: 07/31/21 Potential to Achieve Goals: Good    Frequency Min 3X/week     Co-evaluation               AM-PAC PT "6 Clicks" Mobility  Outcome Measure Help needed turning from your back to your side while in a flat bed without using bedrails?: A Little Help needed moving from lying on your back to sitting on the side of a flat bed without using bedrails?: A Lot Help needed moving to and from a bed to a chair (including a wheelchair)?: A Little Help needed standing up from a chair using your arms (e.g., wheelchair or bedside chair)?: A Lot Help needed to walk in hospital room?: A Little Help needed climbing 3-5 steps with a railing? : A Little 6 Click Score: 16    End of Session Equipment Utilized During Treatment: Oxygen Activity Tolerance: Patient tolerated treatment well Patient left: in chair;with call bell/phone within reach;with family/visitor present Nurse Communication: Mobility status PT Visit Diagnosis: Other abnormalities of gait and mobility (R26.89);Muscle weakness (generalized) (M62.81)    Time: 9470-9628 PT Time Calculation (min) (ACUTE ONLY): 26 min   Charges:   PT Evaluation $PT Eval Moderate Complexity: 1 Mod PT Treatments $Therapeutic Activity: 8-22 mins      Mabeline Caras,  PT, DPT Acute Rehabilitation Services  Pager [phone] Office [place] 07/17/2021, 1:38 PM

## 2021-07-17 NOTE — Progress Notes (Signed)
Pt went into afib RVR @2215  HR 130's- 160's 5 mg Prn lopressor given  hr still 120's- 160's. BP 105/68 MAP 77 paged Dr Kipp Brood new orders received

## 2021-07-17 NOTE — Progress Notes (Signed)
Chest tubes x2 removed per order. Pt tolerated removal well, VSS. Informed to call if experiences any discomfort following removal. No other needs or questions at this time, call bell within reach and family at bedside.

## 2021-07-17 NOTE — Progress Notes (Signed)
°  Amiodarone Drug - Drug Interaction Consult Note  Recommendations: Monitor K+, QTc, HR, signs of muscle pain  Amiodarone is metabolized by the cytochrome P450 system and therefore has the potential to cause many drug interactions. Amiodarone has an average plasma half-life of 50 days (range 20 to 100 days).   There is potential for drug interactions to occur several weeks or months after stopping treatment and the onset of drug interactions may be slow after initiating amiodarone.   [x]  Statins: Increased risk of myopathy. Simvastatin- restrict dose to 20mg  daily. Other statins: counsel patients to report any muscle pain or weakness immediately.  []  Anticoagulants: Amiodarone can increase anticoagulant effect. Consider warfarin dose reduction. Patients should be monitored closely and the dose of anticoagulant altered accordingly, remembering that amiodarone levels take several weeks to stabilize.  []  Antiepileptics: Amiodarone can increase plasma concentration of phenytoin, the dose should be reduced. Note that small changes in phenytoin dose can result in large changes in levels. Monitor patient and counsel on signs of toxicity.  [x]  Beta blockers: increased risk of bradycardia, AV block and myocardial depression. Sotalol - avoid concomitant use.  []   Calcium channel blockers (diltiazem and verapamil): increased risk of bradycardia, AV block and myocardial depression.  []   Cyclosporine: Amiodarone increases levels of cyclosporine. Reduced dose of cyclosporine is recommended.  []  Digoxin dose should be halved when amiodarone is started.  [x]  Diuretics: increased risk of cardiotoxicity if hypokalemia occurs.  []  Oral hypoglycemic agents (glyburide, glipizide, glimepiride): increased risk of hypoglycemia. Patient's glucose levels should be monitored closely when initiating amiodarone therapy.   [x]  Drugs that prolong the QT interval:  Torsades de pointes risk may be increased with  concurrent use - avoid if possible.  Monitor QTc, also keep magnesium/potassium WNL if concurrent therapy can't be avoided.  Antibiotics: e.g. fluoroquinolones, erythromycin.  Antiarrhythmics: e.g. quinidine, procainamide, disopyramide, sotalol.  Antipsychotics: e.g. phenothiazines, haloperidol.   Lithium, tricyclic antidepressants, and methadone.  Wynona Neat, PharmD, BCPS 07/17/2021 11:28 PM

## 2021-07-17 NOTE — Progress Notes (Signed)
Pacerwires removed per order. VSS, no changes noted in telemetry. Pt advised to remain in bed for one hour following removal and to report any pain or discomfort. No questions or other needs from patient at this time. Call bell within reach.

## 2021-07-17 NOTE — Progress Notes (Signed)
Assisted tele visit to patient with wife.  Rafferty Postlewait M, RN  

## 2021-07-18 ENCOUNTER — Encounter: Payer: Self-pay | Admitting: Cardiology

## 2021-07-18 ENCOUNTER — Inpatient Hospital Stay (HOSPITAL_COMMUNITY): Payer: Medicare Other

## 2021-07-18 LAB — BASIC METABOLIC PANEL
Anion gap: 9 (ref 5–15)
BUN: 15 mg/dL (ref 8–23)
CO2: 26 mmol/L (ref 22–32)
Calcium: 8.2 mg/dL — ABNORMAL LOW (ref 8.9–10.3)
Chloride: 99 mmol/L (ref 98–111)
Creatinine, Ser: 1.03 mg/dL (ref 0.61–1.24)
GFR, Estimated: >60 mL/min (ref >60)
Glucose, Bld: 109 mg/dL — ABNORMAL HIGH (ref 70–99)
Potassium: 4.6 mmol/L (ref 3.5–5.1)
Sodium: 134 mmol/L — ABNORMAL LOW (ref 135–145)

## 2021-07-18 LAB — CBC
HCT: 34.2 % — ABNORMAL LOW (ref 39.0–52.0)
Hemoglobin: 11.3 g/dL — ABNORMAL LOW (ref 13.0–17.0)
MCH: 30.5 pg (ref 26.0–34.0)
MCHC: 33 g/dL (ref 30.0–36.0)
MCV: 92.2 fL (ref 80.0–100.0)
Platelets: 122 10*3/uL — ABNORMAL LOW (ref 150–400)
RBC: 3.71 MIL/uL — ABNORMAL LOW (ref 4.22–5.81)
RDW: 13.3 % (ref 11.5–15.5)
WBC: 12.8 10*3/uL — ABNORMAL HIGH (ref 4.0–10.5)
nRBC: 0 % (ref 0.0–0.2)

## 2021-07-18 LAB — TYPE AND SCREEN
ABO/RH(D): B POS
Antibody Screen: NEGATIVE
Unit division: 0
Unit division: 0

## 2021-07-18 LAB — BPAM RBC
Blood Product Expiration Date: 202301172359
Blood Product Expiration Date: 202301192359
Unit Type and Rh: 7300
Unit Type and Rh: 7300

## 2021-07-18 LAB — GLUCOSE, CAPILLARY
Glucose-Capillary: 110 mg/dL — ABNORMAL HIGH (ref 70–99)
Glucose-Capillary: 117 mg/dL — ABNORMAL HIGH (ref 70–99)
Glucose-Capillary: 109 mg/dL — ABNORMAL HIGH (ref 70–99)
Glucose-Capillary: 109 mg/dL — ABNORMAL HIGH (ref 70–99)

## 2021-07-18 MED ORDER — FUROSEMIDE 10 MG/ML IJ SOLN
40.0000 mg | Freq: Every day | INTRAMUSCULAR | Status: DC
  Administered 2021-07-18 – 2021-07-20 (×3): 40 mg via INTRAVENOUS
  Filled 2021-07-18 (×3): qty 4

## 2021-07-18 MED ORDER — ENSURE ENLIVE PO LIQD
237.0000 mL | Freq: Three times a day (TID) | ORAL | Status: DC
  Administered 2021-07-18 – 2021-07-19 (×3): 237 mL via ORAL

## 2021-07-18 MED ORDER — ENOXAPARIN SODIUM 40 MG/0.4ML IJ SOSY
40.0000 mg | PREFILLED_SYRINGE | INTRAMUSCULAR | Status: DC
  Administered 2021-07-18 – 2021-07-19 (×2): 40 mg via SUBCUTANEOUS
  Filled 2021-07-18 (×2): qty 0.4

## 2021-07-18 NOTE — TOC Initial Note (Signed)
Transition of Care (TOC) - Initial/Assessment Note  Marvetta Gibbons RN, BSN Transitions of Care Unit 4E- RN Case Manager See Treatment Team for direct phone #    Patient Details  Name: Brian Sullivan MRN: [id number] Date of Birth: 30-Sep-1945  [facility]) CM/SW Contact:    Dawayne Patricia, RN Phone Number: 07/18/2021, 3:40 PM  Clinical Narrative:                 Pt from home with wife s/p CABG. Orders placed for HHRN/PT- CM in to speak with pt and wife at bedside. Discussed HH orders- per conversation with pt and wife pt has used [facility] in the past with [facility]- list provided for [facility] choice Per CMS guidelines from medicare.gov website with star ratings (copy placed in shadow chart) - pt and wife would like to use [facility] again for services. Address, phone #s and PCP all confirmed in epic.   Discussed DME- pt has needed DME at home, to include RW and shower rails. No new DME needs noted. Wife to transport home.  Wife also asked about Private Duty assistance- CM provide info on agencies in [place] per wife request. (Will use google search -wife agreeable to search and printed list).   Call made to [facility]- referral for RN/PT has been accepted- they will follow for start of care- potential d/c home this week 1/11 or 1/12.   Expected Discharge Plan: [place] Barriers to Discharge: Continued Medical Work up   Patient Goals and CMS Choice Patient states their goals for this hospitalization and ongoing recovery are:: return home CMS Medicare.gov Compare Post Acute Care list provided to:: Patient Choice offered to / list presented to : Patient, Spouse  Expected Discharge Plan and Services Expected Discharge Plan: [place]   Discharge Planning Services: CM Consult Post Acute Care Choice: Durable Medical Equipment, Home Health Living arrangements for the past 2 months: Apartment                           HH  Arranged: RN, PT [facility] Agency: [facility] Date [place]: 07/18/21 Time HH Agency Contacted: 1430    Prior Living Arrangements/Services Living arrangements for the past 2 months: Apartment Lives with:: Spouse Patient language and need for interpreter reviewed:: Yes Do you feel safe going back to the place where you live?: Yes      Need for Family Participation in Patient Care: Yes (Comment) Care giver support system in place?: Yes (comment) Current home services: DME Criminal Activity/Legal Involvement Pertinent to Current Situation/Hospitalization: No - Comment as needed  Activities of Daily Living Home Assistive Devices/Equipment: Cane (specify quad or straight) ADL Screening (condition at time of admission) Patient's cognitive ability adequate to safely complete daily activities?: Yes Is the patient deaf or have difficulty hearing?: No Does the patient have difficulty seeing, even when wearing glasses/contacts?: No Does the patient have difficulty concentrating, remembering, or making decisions?: No Patient able to express need for assistance with ADLs?: Yes Does the patient have difficulty dressing or bathing?: No Independently performs ADLs?: Yes (appropriate for developmental age) Does the patient have difficulty walking or climbing stairs?: Yes Weakness of Legs: Both Weakness of Arms/Hands: None  Permission Sought/Granted Permission sought to share information with : Facility Art therapist granted to share information with : Yes, Verbal Permission Granted     Permission  granted to share info w AGENCY: HH        Emotional Assessment Appearance:: Appears stated age Attitude/Demeanor/Rapport: Engaged Affect (typically observed): Accepting, Appropriate, Pleasant Orientation: : Oriented to Self, Oriented to Place, Oriented to  Time, Oriented to Situation Alcohol / Substance Use: Not Applicable Psych Involvement:  No (comment)  Admission diagnosis:  Unstable angina (HCC) [I20.0] Chest pain [R07.9] Chest pain with high risk for cardiac etiology [R07.9] S/P CABG x 3 [Z95.1] Patient Active Problem List   Diagnosis Date Noted   S/P CABG x 3 07/15/2021   Unstable angina ([place]) 07/12/2021   Chest pain 07/11/2021   Pre-op evaluation 04/07/2020   Murmur, cardiac 04/07/2020   Abnormal EKG 04/07/2020   Mixed hyperlipidemia 04/07/2020   Hypercholesteremia    Hypertension    Migraines    Benign hypertensive heart disease without heart failure 04/05/2020   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   High risk medication use 04/05/2020   Rupture of distal biceps tendon 04/05/2020   Irritable bladder 04/05/2020   Prostatitis 04/05/2020   Hyperplasia of prostate with lower urinary tract symptoms (LUTS) 04/05/2020   Hyperlipemia 04/05/2020   Balance problem 04/05/2020   Cerebral palsy ([place]) 04/05/2020   Gastro-esophageal reflux 04/05/2020   Anxiety 04/05/2020   Vitamin D deficiency 04/05/2020   CAD (coronary artery disease) 04/05/2020   IBS (irritable bowel syndrome) 04/05/2020   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

## 2021-07-18 NOTE — Progress Notes (Signed)
°   07/18/21 0213  Vitals  ECG Heart Rate 80  Resp 18   Pt converted to NSR

## 2021-07-18 NOTE — Care Management Important Message (Signed)
Important Message  Patient Details  Name: Brian Sullivan MRN: [id number] Date of Birth: 06-Sep-1945   Medicare Important Message Given:  Yes     Shelda Altes 07/18/2021, 9:41 AM

## 2021-07-18 NOTE — Telephone Encounter (Signed)
Error

## 2021-07-18 NOTE — Progress Notes (Signed)
Mobility Specialist: Progress Note   07/18/21 1538  Mobility  Activity Ambulated in hall  Level of Assistance Moderate assist, patient does 50-74%  Assistive Device Front wheel walker  Distance Ambulated (ft) 290 ft  Mobility Ambulated with assistance in hallway  Mobility Response Tolerated well  Mobility performed by Mobility specialist  Bed Position Chair  $Mobility charge 1 Mobility   Post-Mobility: 68 HR  Pt required modA to stand from recliner with significant posterior lean upon standing. Pt required verbal cues for sternal precautions. No c/o throughout ambulation. Pt back to recliner after walk with call bell in reach and family present in the room.   [facility] Brian Sullivan Mobility Specialist Mobility Specialist 4 [place]: [phone] Mobility Specialist 2 [place]: [phone]

## 2021-07-18 NOTE — Progress Notes (Signed)
CARDIAC REHAB PHASE I   PRE:  Rate/Rhythm: 71 SR    BP: sitting 112/62    SaO2: 93 RA  MODE:  Ambulation: 284 ft   POST:  Rate/Rhythm: 94 SR    BP: sitting 132/80     SaO2: 97 RA  Pt stood with rocking however upon standing had significant posterior lean requiring support with gait belt to stay upright. Ambulated with RW and gait belt. 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 C/o right arm fatigue with distance. Rest x2, able to generally support himself. To recliner with VSS. No O2 needed walking. Practiced flutter valve and has been practicing IS. [phone]   Isabella, ACSM 07/18/2021 12:07 PM

## 2021-07-18 NOTE — Progress Notes (Addendum)
[address]       ,[place] [address]             [phone]      3 Days Post-Op Procedure(s) (LRB): CORONARY ARTERY BYPASS GRAFTING (CABG) X 3, USING LEFT INTERNAL MAMMARY ARTERY, ENDOSCOPICALLY HARVESTED RIGHT GREATER SAPHENOUS VEIN (N/A) TRANSESOPHAGEAL ECHOCARDIOGRAM (TEE) (N/A) ENDOVEIN HARVEST OF GREATER SAPHENOUS VEIN (Right) Subjective: Transferred to 4E from [place] last evening.  Awake and alert, had some nausea with vomiting yesterday.  Denies abd pain. BM x 3 since surgery.   Walked 3 times yesterday.   A-fib late last night, converted back to SR after IV amiodarone load.   Objective: Vital signs in last 24 hours: Temp:  [97.6 F (36.4 C)-99.4 F (37.4 C)] 99.4 F (37.4 C) (01/09 0300) Pulse Rate:  [25-141] 63 (01/09 0500) Cardiac Rhythm: Atrial fibrillation;Normal sinus rhythm;Other (Comment) (01/09 5284) Resp:  [15-22] 17 (01/09 0500) BP: (81-130)/(57-104) 90/67 (01/09 0500) SpO2:  [60 %-99 %] 98 % (01/09 0500)     Intake/Output from previous day: 01/08 0701 - 01/09 0700 In: 667.1 [P.O.:580; I.V.:87.1] Out: 134 [Urine:12; Emesis/NG output:100; [place]; Chest Tube:20] Intake/Output this shift: No intake/output data recorded.  General appearance: alert, cooperative, and no distress Neurologic: intact Heart: currently in SR with rate 70's. Lungs: breath sounds clear, diminished.  Abdomen: soft, NT Extremities: mild expected bruising right thigh. The right EVH incision is intact and dry.  Wound: the sternotomy incision is well approximated and dry. CT's and pacer wires removed yesterday.   Lab Results: Recent Labs    07/17/21 0843 07/18/21 0202  WBC 15.0* 12.8*  HGB 11.8* 11.3*  HCT 36.1* 34.2*  PLT 127* 122*   BMET:  Recent Labs    07/17/21 0843 07/18/21 0202  NA 134* 134*  K 3.8 4.6  CL 98 99  CO2 25 26  GLUCOSE 124* 109*  BUN 11 15  CREATININE 1.03 1.03  CALCIUM 8.8* 8.2*    PT/INR:  Recent Labs     07/15/21 1431  LABPROT 17.8*  INR 1.5*   ABG    Component Value Date/Time   PHART 7.325 (L) 07/15/2021 2301   HCO3 22.4 07/15/2021 2301   TCO2 24 07/15/2021 2301   ACIDBASEDEF 4.0 (H) 07/15/2021 2301   O2SAT 91.0 07/15/2021 2301   CBG (last 3)  Recent Labs    07/17/21 1621 07/17/21 2027 07/18/21 0554  GLUCAP 104* 103* 110*    Assessment/Plan: S/P Procedure(s) (LRB): CORONARY ARTERY BYPASS GRAFTING (CABG) X 3, USING LEFT INTERNAL MAMMARY ARTERY, ENDOSCOPICALLY HARVESTED RIGHT GREATER SAPHENOUS VEIN (N/A) TRANSESOPHAGEAL ECHOCARDIOGRAM (TEE) (N/A) ENDOVEIN HARVEST OF GREATER SAPHENOUS VEIN (Right)  -POD3 CABG x 3 for MVCAD presenting with exertional angina, EF 50-55%.  On  ASA, metoprolol 25mg  BID, pravastatin 20mg  QD. Progressing well with mobility.   -Post-op atrial fibrillation- back in SR after amio load. K+ 4.5, Mg++ was 2.4 on 1/7. Converted to SR about 5 hours ago. Will continue the amio infusion today and convert to PO tomorrow.   -Volume excess- Wt ~5kg+, continue oral lasix 40mg  daily.   -GI- nausea with vomiting yesterday. Having BM's. Abd exam benign this morning. H/O GERD and IBS. Continue PPI  -PULM- Currently on RA with adequate sats. CXR yesterday showing bibasilar ATX.  Continue working on pulmonary hygiene. PA/LAT CXR tomorrow.   ENDO- no h/o DM. CBG's acceptable and requiring no coverage. Will d/c CBG's and SSI.   H/O hypothyroidism, synthroid resumed.   -DVT  PPX- add daily  enoxaparin.   -Disposition- Planning for eventual discharge to home.  Anticipate he will be ready in 48 hours.     LOS: 6 days    Brian Sullivan, [place] [phone] 07/18/2021   Patient examined, CXR  image today reviewed Now in nsr on iv amiodarone- transition to po tomorrow I have d/w the patient and  wife the current condition and plan of care I have placed face to face orders for [facility] PT and RN Agree with above assessment and plan by MR, PA-C  P Prescott Gum MD

## 2021-07-19 ENCOUNTER — Inpatient Hospital Stay (HOSPITAL_COMMUNITY): Payer: Medicare Other

## 2021-07-19 LAB — GLUCOSE, CAPILLARY
Glucose-Capillary: 118 mg/dL — ABNORMAL HIGH (ref 70–99)
Glucose-Capillary: 100 mg/dL — ABNORMAL HIGH (ref 70–99)
Glucose-Capillary: 123 mg/dL — ABNORMAL HIGH (ref 70–99)

## 2021-07-19 LAB — CBC
HCT: 32.8 % — ABNORMAL LOW (ref 39.0–52.0)
Hemoglobin: 10.7 g/dL — ABNORMAL LOW (ref 13.0–17.0)
MCH: 30 pg (ref 26.0–34.0)
MCHC: 32.6 g/dL (ref 30.0–36.0)
MCV: 91.9 fL (ref 80.0–100.0)
Platelets: 138 10*3/uL — ABNORMAL LOW (ref 150–400)
RBC: 3.57 MIL/uL — ABNORMAL LOW (ref 4.22–5.81)
RDW: 13.3 % (ref 11.5–15.5)
WBC: 10.6 10*3/uL — ABNORMAL HIGH (ref 4.0–10.5)
nRBC: 0 % (ref 0.0–0.2)

## 2021-07-19 LAB — BASIC METABOLIC PANEL
Anion gap: 8 (ref 5–15)
BUN: 12 mg/dL (ref 8–23)
CO2: 28 mmol/L (ref 22–32)
Calcium: 8.2 mg/dL — ABNORMAL LOW (ref 8.9–10.3)
Chloride: 99 mmol/L (ref 98–111)
Creatinine, Ser: 0.96 mg/dL (ref 0.61–1.24)
GFR, Estimated: >60 mL/min (ref >60)
Glucose, Bld: 99 mg/dL (ref 70–99)
Potassium: 3.8 mmol/L (ref 3.5–5.1)
Sodium: 135 mmol/L (ref 135–145)

## 2021-07-19 MED ORDER — AMIODARONE HCL 200 MG PO TABS: 400.0000 mg | ORAL_TABLET | Freq: Two times a day (BID) | ORAL | Status: DC

## 2021-07-19 MED ORDER — FENTANYL CITRATE PF 50 MCG/ML IJ SOSY: 25.0000 ug | PREFILLED_SYRINGE | INTRAMUSCULAR | Status: DC | PRN

## 2021-07-19 MED ORDER — ESOMEPRAZOLE MAGNESIUM 20 MG PO CPDR
20.0000 mg | DELAYED_RELEASE_CAPSULE | Freq: Every day | ORAL | Status: DC
  Administered 2021-07-19: 20 mg via ORAL
  Filled 2021-07-19: qty 1

## 2021-07-19 MED ORDER — AMIODARONE HCL 200 MG PO TABS
200.0000 mg | ORAL_TABLET | Freq: Two times a day (BID) | ORAL | Status: DC
  Administered 2021-07-19 (×2): 200 mg via ORAL
  Filled 2021-07-19 (×2): qty 1

## 2021-07-19 MED ORDER — TRAMADOL HCL 50 MG PO TABS
50.0000 mg | ORAL_TABLET | ORAL | Status: DC | PRN
  Administered 2021-07-19 – 2021-07-21 (×4): 50 mg via ORAL
  Filled 2021-07-19 (×4): qty 1

## 2021-07-19 MED ORDER — ESOMEPRAZOLE MAGNESIUM 20 MG PO CPDR
40.0000 mg | DELAYED_RELEASE_CAPSULE | Freq: Two times a day (BID) | ORAL | Status: DC
  Administered 2021-07-19 – 2021-07-21 (×4): 40 mg via ORAL
  Filled 2021-07-19 (×5): qty 2

## 2021-07-19 MED ORDER — AMIODARONE IV BOLUS ONLY 150 MG/100ML
150.0000 mg | Freq: Once | INTRAVENOUS | Status: AC
  Administered 2021-07-19: 150 mg via INTRAVENOUS
  Filled 2021-07-19: qty 100

## 2021-07-19 MED ORDER — AMIODARONE HCL IN DEXTROSE 360-4.14 MG/200ML-% IV SOLN
INTRAVENOUS | Status: AC
  Filled 2021-07-19: qty 200

## 2021-07-19 MED ORDER — POTASSIUM CHLORIDE CRYS ER 20 MEQ PO TBCR
40.0000 meq | EXTENDED_RELEASE_TABLET | Freq: Every day | ORAL | Status: DC
  Administered 2021-07-19 – 2021-07-21 (×3): 40 meq via ORAL
  Filled 2021-07-19 (×3): qty 2

## 2021-07-19 MED FILL — Electrolyte-R (PH 7.4) Solution: INTRAVENOUS | Qty: 6000 | Status: AC

## 2021-07-19 MED FILL — Magnesium Sulfate Inj 50%: INTRAMUSCULAR | Qty: 10 | Status: AC

## 2021-07-19 MED FILL — Mannitol IV Soln 20%: INTRAVENOUS | Qty: 500 | Status: AC

## 2021-07-19 MED FILL — Sodium Chloride IV Soln 0.9%: INTRAVENOUS | Qty: 2000 | Status: AC

## 2021-07-19 MED FILL — Heparin Sodium (Porcine) Inj 1000 Unit/ML: Qty: 1000 | Status: AC

## 2021-07-19 MED FILL — Albumin, Human Inj 5%: INTRAVENOUS | Qty: 250 | Status: AC

## 2021-07-19 MED FILL — Heparin Sodium (Porcine) Inj 1000 Unit/ML: INTRAMUSCULAR | Qty: 10 | Status: AC

## 2021-07-19 MED FILL — Potassium Chloride Inj 2 mEq/ML: INTRAVENOUS | Qty: 40 | Status: AC

## 2021-07-19 MED FILL — Lidocaine HCl (Cardiac) IV PF Soln 100 MG/5ML (2%): INTRAVENOUS | Qty: 5 | Status: AC

## 2021-07-19 NOTE — Progress Notes (Signed)
Pt in bed, c/o severe stomach pain. Unable to walk right now, RN agrees, working on meds. Third attempt today with pt. [place] CES, ACSM 2:32 PM 07/19/2021

## 2021-07-19 NOTE — Progress Notes (Signed)
Patients Amiodarone drip d/c'd and PO amiodarone given. Patient went back into rapid atrial fibrillation. 5mg  IV metoprolol administered with no results. PA notified by RN. Order for Amiodarone bolus 150mg  placed. Medication administered as ordered. Patient converted back to NSR currently with a rate of 73. Patient alert and orineted, denies chest pain and denies SOB. Currently resting in bed, no acute distress, wife at bedside. Fuller Canada, RN

## 2021-07-19 NOTE — Progress Notes (Signed)
PT Cancellation Note  Patient Details Name: Cordarrell Sane MRN: [id number] DOB: 09/05/1945   Cancelled Treatment:    Reason Eval/Treat Not Completed: Pain limiting ability to participate; RN requests hold on mobility due to GI pain. Will follow-up for PT treatment as schedule permits.  Mabeline Caras, PT, DPT Acute Rehabilitation Services  Pager [phone] Office [place] 07/19/2021, 3:59 PM

## 2021-07-19 NOTE — Progress Notes (Signed)
[address]       [place] [address]             [phone]      4 Days Post-Op Procedure(s) (LRB): CORONARY ARTERY BYPASS GRAFTING (CABG) X 3, USING LEFT INTERNAL MAMMARY ARTERY, ENDOSCOPICALLY HARVESTED RIGHT GREATER SAPHENOUS VEIN (N/A) TRANSESOPHAGEAL ECHOCARDIOGRAM (TEE) (N/A) ENDOVEIN HARVEST OF GREATER SAPHENOUS VEIN (Right) Subjective: Feels uncomfortable in general.  Says he has been awake since 3am, is having some abdominal discomfort, face feels hot, appetite is poor, and food tastes bad.   Walked with therapists yesterday, continues to require assistance with balance.      Objective: Vital signs in last 24 hours: Temp:  [98.2 F (36.8 C)-98.7 F (37.1 C)] 98.2 F (36.8 C) (01/10 0340) Pulse Rate:  [64-91] 75 (01/10 0340) Cardiac Rhythm: Normal sinus rhythm (01/10 0100) Resp:  [17-20] 19 (01/10 0340) BP: (99-132)/(54-85) 128/85 (01/10 0340) SpO2:  [95 %-100 %] 100 % (01/10 0340)     Intake/Output from previous day: 01/09 0701 - 01/10 0700 In: 1456 [P.O.:960; I.V.:496] Out: 1300 [Urine:1300] Intake/Output this shift: No intake/output data recorded.  General appearance: alert, cooperative, and moderate distress Neurologic: intact Heart: currently in SR with rate 70's. Lungs: breath sounds clear, diminished.  Abdomen: mildly distended with minor tenderness, soft, rare bowel sounds Extremities: expected bruising right thigh. The right EVH incision is intact and dry.  Wound: the sternotomy incision is well approximated and dry. CT's and pacer wires removed yesterday.   Lab Results: Recent Labs    07/18/21 0202 07/19/21 0332  WBC 12.8* 10.6*  HGB 11.3* 10.7*  HCT 34.2* 32.8*  PLT 122* 138*    BMET:  Recent Labs    07/18/21 0202 07/19/21 0332  NA 134* 135  K 4.6 3.8  CL 99 99  CO2 26 28  GLUCOSE 109* 99  BUN 15 12  CREATININE 1.03 0.96  CALCIUM 8.2* 8.2*     PT/INR:  No results for input(s): LABPROT, INR in the  last 72 hours.  ABG    Component Value Date/Time   PHART 7.325 (L) 07/15/2021 2301   HCO3 22.4 07/15/2021 2301   TCO2 24 07/15/2021 2301   ACIDBASEDEF 4.0 (H) 07/15/2021 2301   O2SAT 91.0 07/15/2021 2301   CBG (last 3)  Recent Labs    07/18/21 1606 07/18/21 2100 07/19/21 0602  GLUCAP 109* 109* 118*     Assessment/Plan: S/P Procedure(s) (LRB): CORONARY ARTERY BYPASS GRAFTING (CABG) X 3, USING LEFT INTERNAL MAMMARY ARTERY, ENDOSCOPICALLY HARVESTED RIGHT GREATER SAPHENOUS VEIN (N/A) TRANSESOPHAGEAL ECHOCARDIOGRAM (TEE) (N/A) ENDOVEIN HARVEST OF GREATER SAPHENOUS VEIN (Right)  -POD4 CABG x 3 for MVCAD presenting with exertional angina, EF 50-55%.  On  ASA, metoprolol 25mg  BID, pravastatin 20mg  QD. Progressing slowly with mobility.   -Post-op atrial fibrillation- remained in SR past 24 hours after amio load. K+ 3.8, will supplement to keep >4.0. Planning to convert amiodarone to PO today but not sure he will tolerate this due to suspected GI side effects. Will discuss with Dr. Darcey Nora.   -Expected acute blood loss anemia- mild, Hct reasonably stable.   -Volume excess- Wt still ~5kg+, continue oral lasix 40mg  daily.   -GI- nausea and abdominal discomfort persist. Having BM's. Abd exam benign. Suspect this is related to the amiodarone.  H/O GERD and IBS. Continue PPI.   -PULM- Currently on RA with adequate sats. CXR showing small bilateral effusions and bibasilar ATX.  Continue working on pulmonary hygiene.  ENDO- no h/o DM. Does have H/O hypothyroidism, Synthroid resumed.   -DVT PPX- continue daily [place] enoxaparin.   -Disposition- Planning for eventual discharge to home with home PT. Has a walker at home.    LOS: 7 days    Antony Odea, [place] [phone] 07/19/2021  Patient developed recurrent afib with conversion to po dosing, received a bolus with return to nsr. Now c/o adbdominal pain prob related to IBS- will place on clear liqs and treat symptoms.  patient  examined and medical record reviewed,agree with above note. Dahlia Byes 07/19/2021

## 2021-07-20 ENCOUNTER — Other Ambulatory Visit (HOSPITAL_COMMUNITY): Payer: Self-pay

## 2021-07-20 DIAGNOSIS — I200 Unstable angina: Secondary | ICD-10-CM | POA: Diagnosis not present

## 2021-07-20 LAB — BASIC METABOLIC PANEL
Anion gap: 8 (ref 5–15)
BUN: 12 mg/dL (ref 8–23)
CO2: 28 mmol/L (ref 22–32)
Calcium: 8 mg/dL — ABNORMAL LOW (ref 8.9–10.3)
Chloride: 99 mmol/L (ref 98–111)
Creatinine, Ser: 0.96 mg/dL (ref 0.61–1.24)
GFR, Estimated: >60 mL/min (ref >60)
Glucose, Bld: 88 mg/dL (ref 70–99)
Potassium: 4 mmol/L (ref 3.5–5.1)
Sodium: 135 mmol/L (ref 135–145)

## 2021-07-20 MED ORDER — AMIODARONE HCL 200 MG PO TABS: 200.0000 mg | ORAL_TABLET | Freq: Two times a day (BID) | ORAL | Status: DC

## 2021-07-20 MED ORDER — FUROSEMIDE 40 MG PO TABS
40.0000 mg | ORAL_TABLET | Freq: Every day | ORAL | Status: DC
  Administered 2021-07-21: 40 mg via ORAL
  Filled 2021-07-20: qty 1

## 2021-07-20 MED ORDER — ASPIRIN EC 81 MG PO TBEC
81.0000 mg | DELAYED_RELEASE_TABLET | Freq: Every day | ORAL | Status: DC
  Administered 2021-07-20 – 2021-07-21 (×2): 81 mg via ORAL
  Filled 2021-07-20 (×2): qty 1

## 2021-07-20 MED ORDER — METOPROLOL TARTRATE 25 MG PO TABS
25.0000 mg | ORAL_TABLET | Freq: Three times a day (TID) | ORAL | Status: DC
  Administered 2021-07-20 – 2021-07-21 (×4): 25 mg via ORAL
  Filled 2021-07-20 (×3): qty 1

## 2021-07-20 MED ORDER — APIXABAN 5 MG PO TABS
5.0000 mg | ORAL_TABLET | Freq: Two times a day (BID) | ORAL | Status: DC
  Administered 2021-07-20 – 2021-07-21 (×3): 5 mg via ORAL
  Filled 2021-07-20 (×3): qty 1

## 2021-07-20 MED ORDER — DRONEDARONE HCL 400 MG PO TABS
400.0000 mg | ORAL_TABLET | Freq: Two times a day (BID) | ORAL | Status: DC
  Administered 2021-07-20 – 2021-07-21 (×2): 400 mg via ORAL
  Filled 2021-07-20 (×3): qty 1

## 2021-07-20 NOTE — Progress Notes (Signed)
ANTICOAGULATION CONSULT NOTE - Follow Up Consult  Pharmacy Consult for apixaban Indication: atrial fibrillation  Allergies  Allergen Reactions   Gemfibrozil Other (See Comments)   Paroxetine Hcl Itching   Statins Other (See Comments)    Leg pain   Sulfamethoxazole Other (See Comments)    Nausea, leg pain     Patient Measurements: Height: 5\' 11"  (180.3 cm) Weight: 92.8 kg (204 lb 9.4 oz) IBW/kg (Calculated) : 75.3 Heparin Dosing Weight:  92.1 kg  Vital Signs: Temp: 97.9 F (36.6 C) (01/11 0758) Temp Source: Oral (01/11 0758) BP: 102/72 (01/11 0758) Pulse Rate: 132 (01/11 0758)  Labs: Recent Labs    07/17/21 0843 07/18/21 0202 07/19/21 0332 07/20/21 0500  HGB 11.8* 11.3* 10.7*  --   HCT 36.1* 34.2* 32.8*  --   PLT 127* 122* 138*  --   CREATININE 1.03 1.03 0.96 0.96    Estimated Creatinine Clearance: 75 mL/min (by C-G formula based on SCr of 0.96 mg/dL).   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 Patient now s/p CABG 1/6.   Patient noted to have afib post op. New orders to start apixaban today. CBC has been stable post op.    Goal of Therapy:  Monitor platelets by anticoagulation protocol: Yes    Plan: Apixaban 5mg  bid Copay check in process Will provide education prior to d/c  Erin Hearing PharmD., BCPS Clinical Pharmacist 07/20/2021 8:24 AM

## 2021-07-20 NOTE — Progress Notes (Signed)
[address]       [address],[place] [address]             [phone]      5 Days Post-Op Procedure(s) (LRB): CORONARY ARTERY BYPASS GRAFTING (CABG) X 3, USING LEFT INTERNAL MAMMARY ARTERY, ENDOSCOPICALLY HARVESTED RIGHT GREATER SAPHENOUS VEIN (N/A) TRANSESOPHAGEAL ECHOCARDIOGRAM (TEE) (N/A) ENDOVEIN HARVEST OF GREATER SAPHENOUS VEIN (Right) Subjective:  Recurrent atrial fibrillation yesterday morning converted back to SR at ~10am after IV amiodarone bolus. Said he had abdominal discomfort most of the day yesterday but improved and he was able to rest better last night.   BM x 1 yesterday.  Back in a-fib with RVR at 155/min this morning, SBP 150's.   Objective: Vital signs in last 24 hours: Temp:  [97.6 F (36.4 C)-98.1 F (36.7 C)] 98.1 F (36.7 C) (01/11 0512) Pulse Rate:  [60-74] 71 (01/11 0512) Cardiac Rhythm: Normal sinus rhythm (01/11 0100) Resp:  [16-19] 19 (01/11 0512) BP: (101-160)/(68-86) 138/75 (01/11 0512) SpO2:  [95 %-100 %] 98 % (01/11 0512) Weight:  [92.8 kg] 92.8 kg (01/11 0632)     Intake/Output from previous day: 01/10 0701 - 01/11 0700 In: 584.9 [P.O.:480; I.V.:104.9] Out: 550 [Urine:550] Intake/Output this shift: No intake/output data recorded.  General appearance: alert, cooperative, and moderate distress Neurologic: intact Heart: a-fib with RVR for past half hour. Otherwise has been in SR since conversion yesterday morning.  Lungs: breath sounds clear, diminished.  Abdomen: mildly distended no tenderness Extremities: expected bruising right thigh. The right EVH incision is intact and dry.  Wound: the sternotomy incision is well approximated and dry. CT's and pacer wires removed yesterday.   Lab Results: Recent Labs    07/18/21 0202 07/19/21 0332  WBC 12.8* 10.6*  HGB 11.3* 10.7*  HCT 34.2* 32.8*  PLT 122* 138*    BMET:  Recent Labs    07/19/21 0332 07/20/21 0500  NA 135 135  K 3.8 4.0  CL 99 99  CO2 28 28   GLUCOSE 99 88  BUN 12 12  CREATININE 0.96 0.96  CALCIUM 8.2* 8.0*     PT/INR:  No results for input(s): LABPROT, INR in the last 72 hours.  ABG    Component Value Date/Time   PHART 7.325 (L) 07/15/2021 2301   HCO3 22.4 07/15/2021 2301   TCO2 24 07/15/2021 2301   ACIDBASEDEF 4.0 (H) 07/15/2021 2301   O2SAT 91.0 07/15/2021 2301   CBG (last 3)  Recent Labs    07/19/21 0602 07/19/21 1211 07/19/21 1628  GLUCAP 118* 100* 123*    CLINICAL DATA:  Abdomen pain   EXAM: PORTABLE ABDOMEN - 1 VIEW   COMPARISON:  None.   FINDINGS: Mild air-filled small and large bowel. No definitive obstruction. No radiopaque calculi. Left hip replacement   IMPRESSION: Mild air-filled small and large bowel without obstructive pattern, question mild ileus     Electronically Signed   By: Donavan Foil M.D.   On: 07/19/2021 17:27    Assessment/Plan: S/P Procedure(s) (LRB): CORONARY ARTERY BYPASS GRAFTING (CABG) X 3, USING LEFT INTERNAL MAMMARY ARTERY, ENDOSCOPICALLY HARVESTED RIGHT GREATER SAPHENOUS VEIN (N/A) TRANSESOPHAGEAL ECHOCARDIOGRAM (TEE) (N/A) ENDOVEIN HARVEST OF GREATER SAPHENOUS VEIN (Right)  -POD5 CABG x 3 for MVCAD presenting with exertional angina, EF 50-55%.  On  ASA, metoprolol 25mg  BID, pravastatin 20mg  QD. Progressing slowly with mobility.   -Post-op atrial fibrillation- third episode of a-fib this morning since surgery. Not tolerating amiodarone due to GI side-effects. Giving IV  metoprolol now for rate control.  Need to anticoagulate, discuss cardiology consult with Dr. Darcey Nora.  -Expected acute blood loss anemia- mild, Hct reasonably stable.   -Volume excess- Wt still ~5kg+, continue oral lasix 40mg  daily.   -GI- nausea and abdominal discomfort persist. Having BM yesterday morning. Abd exam benign. KUB yesterday showing no obstructive pattern but possible ileus. Plan to stop the amiodarone.   H/O GERD and IBS. Continue PPI. Continue clear liquids for now.    -PULM- Currently on RA with adequate sats. CXR showing small bilateral effusions and bibasilar ATX.  Continue working on pulmonary hygiene.  ENDO- no h/o DM. Does have H/O hypothyroidism, Synthroid resumed.   -DVT PPX- continue daily [place] enoxaparin. Will d/c when oral anticoagulation is started.   -Disposition- Planning for eventual discharge to home with home PT. Has a walker at home.    LOS: 8 days    Brian Sullivan, [place] [phone] 07/20/2021  With recurrent symptomatic postop afib and the patient intolerant of amiodarone[refusing]  will ask for cardiology input on treating afib   patient examined and medical record reviewed,agree with above note. Dahlia Byes 07/20/2021

## 2021-07-20 NOTE — Progress Notes (Signed)
CARDIAC REHAB PHASE I   Pts HR noted to be Afib with couplets with rate 120-150s. Pt denies palpitations, dizziness, or SOB. BP 103/72. Call bell within reach. Wife at bedside. Will continue to monitor.  Rufina Falco, RN BSN 07/20/2021 8:16 AM

## 2021-07-20 NOTE — Progress Notes (Signed)
CARDIAC REHAB PHASE I   PRE:  Rate/Rhythm: 76 SR    BP: sitting 140/79    SaO2: 96 RA  MODE:  Ambulation: 280 ft   POST:  Rate/Rhythm: 96 SR    BP: sitting 150/85     SaO2: 96 RA  Pt feeling better and ready to ambulate. Stood by rocking and higher bed. Gait belt present but no real assist needed. Pt ambulated with RW, steadier today than 2 days ago. After 200 ft pt with sudden wave of nausea and had to sit and rest. After rest, able to stand again and return to room. VSS, no afib.  1410-3013   [place], ACSM 07/20/2021 11:59 AM

## 2021-07-20 NOTE — Progress Notes (Signed)
° °  Called by RN pt back in atrial fib and he has rec'd IV lopressor.  He is refusing amiodarone.  Discussed with Dr. Domenic Polite, DOD, and most likely will need antiarrythmic.  Will add multaq to his BB and stop amiodarone.    Cecilie Kicks, FNP-C [facility] [place]  [phone] or after 5pm and on weekends call [phone] 07/20/2021.now

## 2021-07-20 NOTE — Progress Notes (Signed)
Mobility Specialist: Progress Note   07/20/21 1640  Mobility  Activity Ambulated in hall  Level of Assistance Standby assist, set-up cues, supervision of patient - no hands on  Assistive Device Front wheel walker  Distance Ambulated (ft) 300 ft  Mobility Ambulated with assistance in hallway  Mobility Response Tolerated well  Mobility performed by Mobility specialist  $Mobility charge 1 Mobility   Pre-Mobility: 80 HR Post-Mobility: 94 HR  Pt independent to EOB as well as to stand with no c/o throughout ambulation. Pt back to bed after walk per request. Needed verbal cues for sequence to get back into bed. Family member present in the room.   [facility] Kyriana [place]kee Mobility Specialist Mobility Specialist 4 [place]: [phone] Mobility Specialist 2 [place]: [phone]

## 2021-07-20 NOTE — Progress Notes (Signed)
Physical Therapy Treatment Patient Details Name: Brian Sullivan MRN: [id number] DOB: 03-Dec-1945 Today's Date: 07/20/2021   History of Present Illness Pt is a 76 y.o. male admitted 07/11/21 for ACS and probable NSTEMI. 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 S/p CABG x3 on 1/6. PMH includes CAD, HTN, IBS, cerebral palsy, h/o tobacco use, anxiety.   PT Comments    Pt progressing well with mobility. Today's session focused on bed mobility, transfer and gait training while maintaining sternal precautions; pt most stable when ambulating using RW. Pt's wife present and supportive. Reviewed educ re: precautions, activity recommendations, fall risk reduction, importance of mobility. Pt and wife hopeful for d/c home tomorrow; report no further questions or concerns. If to remain admitted, will continue to follow acutely.     Recommendations for follow up therapy are one component of a multi-disciplinary discharge planning process, led by the attending physician.  Recommendations may be updated based on patient status, additional functional criteria and insurance authorization.  Follow Up Recommendations  Home health PT     Assistance Recommended at Discharge Intermittent Supervision/Assistance  Patient can return home with the following A little help with bathing/dressing/bathroom;Assistance with cooking/housework;Assist for transportation;Help with stairs or ramp for entrance   Equipment Recommendations  None recommended by PT    Recommendations for Other Services       Precautions / Restrictions Precautions Precautions: Fall;Sternal     Mobility  Bed Mobility Overal bed mobility: Needs Assistance Bed Mobility: Rolling;Sidelying to Sit;Sit to Sidelying Rolling: Supervision Sidelying to sit: Min assist     Sit to sidelying: Supervision General bed mobility comments: bed flat, reviewed sequencing for log roll technique, minA for LE management when transitioning from sidelying to sit; return to  supine without assist    Transfers Overall transfer level: Needs assistance Equipment used: Rolling walker (2 wheels);Straight cane Transfers: Sit to/from Stand Sit to Stand: Min guard;Min assist           General transfer comment: pt having difficulty standing with hands on knees, able to stand well with hands on bed beside thighs with use of momentum, min guard standing to RW; additional stand with SPC and minA for stability    Ambulation/Gait Ambulation/Gait assistance: Supervision;Min guard Gait Distance (Feet): 300 Feet (+ 300') Assistive device: Rolling walker (2 wheels);Straight cane Gait Pattern/deviations: Step-through pattern;Decreased stride length;Trunk flexed Gait velocity: Decreased     General Gait Details: Slow, steady gait with RW and supervision for safety. Additional gait trial with SPC, intermittent min guard for balance; pt endores feeling more stable and confident with RW   Stairs Stairs:  (wife declined; reports no concern descending/ascending single, short step into garage door at home)           Wheelchair Mobility    Modified Rankin (Stroke Patients Only)       Balance Overall balance assessment: Needs assistance         Standing balance support: No upper extremity supported Standing balance-Leahy Scale: Fair Standing balance comment: can static stand without UE support; static and dynamic stability improved with UE support                            Cognition Arousal/Alertness: Awake/alert Behavior During Therapy: WFL for tasks assessed/performed Overall Cognitive Status: Within Functional Limits for tasks assessed  General Comments: WFL for simple tasks, not formally assessed        Exercises      General Comments General comments (skin integrity, edema, etc.): Pt's wife present and supportive, hopeful for d/c home tomorrow; already set up with HHPT, no DME needs. Pt  and wife report no further questions or concerns      Pertinent Vitals/Pain Pain Assessment: No/denies pain    Home Living                          Prior Function            PT Goals (current goals can now be found in the care plan section) Progress towards PT goals: Progressing toward goals    Frequency    Min 3X/week      PT Plan Current plan remains appropriate    Co-evaluation              AM-PAC PT "6 Clicks" Mobility   Outcome Measure  Help needed turning from your back to your side while in a flat bed without using bedrails?: A Little Help needed moving from lying on your back to sitting on the side of a flat bed without using bedrails?: A Little Help needed moving to and from a bed to a chair (including a wheelchair)?: A Little Help needed standing up from a chair using your arms (e.g., wheelchair or bedside chair)?: A Little Help needed to walk in hospital room?: A Little Help needed climbing 3-5 steps with a railing? : A Little 6 Click Score: 18    End of Session Equipment Utilized During Treatment: Gait belt Activity Tolerance: Patient tolerated treatment well Patient left: in bed;with call bell/phone within reach;with family/visitor present Nurse Communication: Mobility status PT Visit Diagnosis: Other abnormalities of gait and mobility (R26.89);Muscle weakness (generalized) (M62.81)     Time: 1194-1740 PT Time Calculation (min) (ACUTE ONLY): 21 min  Charges:  $Gait Training: 8-22 mins                     Mabeline Caras, PT, DPT Acute Rehabilitation Services  Pager [phone] Office [place] 07/20/2021, 3:35 PM

## 2021-07-20 NOTE — Progress Notes (Signed)
Progress Note  Patient Name: Brian Sullivan Date of Encounter: 07/20/2021  Primary Cardiologist: Berniece Salines, DO   Subjective   Patient was seen and examined at his bedside. He was in physical therapy when I arrived. He is happy with his recovery so far. No compliants.  Inpatient Medications    Scheduled Meds:  acetaminophen  1,000 mg Oral Q6H   Or   acetaminophen (TYLENOL) oral liquid 160 mg/5 mL  1,000 mg Per Tube Q6H   amitriptyline  25 mg Oral QHS   apixaban  5 mg Oral BID   aspirin EC  81 mg Oral Daily   bisacodyl  10 mg Oral Daily   Or   bisacodyl  10 mg Rectal Daily   Chlorhexidine Gluconate Cloth  6 each Topical Daily   docusate sodium  200 mg Oral Daily   escitalopram  10 mg Oral QHS   esomeprazole  40 mg Oral BID AC   feeding supplement  237 mL Oral TID WC   furosemide  40 mg Intravenous Daily   levothyroxine  50 mcg Oral Q0600   metoprolol tartrate  25 mg Oral TID   potassium chloride  40 mEq Oral Daily   pravastatin  20 mg Oral Daily   sodium chloride flush  3 mL Intravenous Q12H   Continuous Infusions:  PRN Meds: alum & mag hydroxide-simeth, fentaNYL (SUBLIMAZE) injection, metoprolol tartrate, ondansetron (ZOFRAN) IV, oxyCODONE, sodium chloride flush, traMADol   Vital Signs    Vitals:   07/20/21 0632 07/20/21 0758 07/20/21 0800 07/20/21 1118  BP:  102/72 (!) 120/106 140/79  Pulse:  (!) 132 100 79  Resp:  18 20 19   Temp:  97.9 F (36.6 C)  98.8 F (37.1 C)  TempSrc:  Oral  Oral  SpO2:  94% 94% 96%  Weight: 92.8 kg     Height:        Intake/Output Summary (Last 24 hours) at 07/20/2021 1240 Last data filed at 07/20/2021 0600 Gross per 24 hour  Intake 483 ml  Output 550 ml  Net -67 ml   Filed Weights   07/16/21 0500 07/17/21 0500 07/20/21 7124  Weight: 97 kg 97 kg 92.8 kg    Telemetry    Sinus rhythm - Personally Reviewed  ECG    None today - Personally Reviewed  Physical Exam    General: Comfortable Head: Atraumatic, normal size   Eyes: PEERLA, EOMI  Neck: Supple, normal JVD Cardiac: Normal S1, S2; RRR; no murmurs, rubs, or gallops Chest wall: with scar Lungs: Clear to auscultation bilaterally Abd: Soft, nontender, no hepatomegaly  Ext: warm, no edema Musculoskeletal: No deformities, BUE and BLE strength normal and equal Skin: Warm and dry, no rashes   Neuro: Alert and oriented to person, place, time, and situation, CNII-XII grossly intact, no focal deficits  Psych: Normal mood and affect   Labs    Chemistry Recent Labs  Lab 07/14/21 0320 07/15/21 0444 07/18/21 0202 07/19/21 0332 07/20/21 0500  NA 135   < > 134* 135 135  K 4.1   < > 4.6 3.8 4.0  CL 105   < > 99 99 99  CO2 24   < > 26 28 28   GLUCOSE 96   < > 109* 99 88  BUN 14   < > 15 12 12   CREATININE 1.07   < > 1.03 0.96 0.96  CALCIUM 8.6*   < > 8.2* 8.2* 8.0*  PROT 5.7*  --   --   --   --  ALBUMIN 3.1*  --   --   --   --   AST 18  --   --   --   --   ALT 17  --   --   --   --   ALKPHOS 80  --   --   --   --   BILITOT 0.7  --   --   --   --   GFRNONAA >60   < > >60 >60 >60  ANIONGAP 6   < > 9 8 8    < > = values in this interval not displayed.     Hematology Recent Labs  Lab 07/17/21 0843 07/18/21 0202 07/19/21 0332  WBC 15.0* 12.8* 10.6*  RBC 3.91* 3.71* 3.57*  HGB 11.8* 11.3* 10.7*  HCT 36.1* 34.2* 32.8*  MCV 92.3 92.2 91.9  MCH 30.2 30.5 30.0  MCHC 32.7 33.0 32.6  RDW 13.4 13.3 13.3  PLT 127* 122* 138*    Cardiac EnzymesNo results for input(s): TROPONINI in the last 168 hours. No results for input(s): TROPIPOC in the last 168 hours.   BNPNo results for input(s): BNP, PROBNP in the last 168 hours.   DDimer No results for input(s): DDIMER in the last 168 hours.   Radiology    DG Abd Portable 1V  Result Date: 07/19/2021 CLINICAL DATA:  Abdomen pain EXAM: PORTABLE ABDOMEN - 1 VIEW COMPARISON:  None. FINDINGS: Mild air-filled small and large bowel. No definitive obstruction. No radiopaque calculi. Left hip replacement  IMPRESSION: Mild air-filled small and large bowel without obstructive pattern, question mild ileus Electronically Signed   By: Donavan Foil M.D.   On: 07/19/2021 17:27    Cardiac Studies   LHC pre-CABG 07/13/2021   Prox RCA lesion is 90% stenosed.   Prox RCA to Mid RCA lesion is 90% stenosed.   Dist RCA lesion is 30% stenosed.   Prox Cx lesion is 40% stenosed.   2nd Mrg lesion is 80% stenosed.   Mid Cx lesion is 90% stenosed.   Mid LAD lesion is 85% stenosed.   Ost LAD to Prox LAD lesion is 50% stenosed.   1.  Severe heavily calcified three-vessel coronary artery disease. 2.  Left ventricular angiography was not performed.  EF was 50 to 55% by echo.  Normal left ventricular end-diastolic pressure at 6 mmHg. 3.  Mild aortic stenosis with peak to peak gradient of 16 mmHg. 3.  Very difficult procedure via the right radial artery due to tortuosity of the innominate artery and difficult engagement of the coronary arteries in spite of using a long sheath.   Recommendations: Given diffuse calcified three-vessel coronary artery disease, recommend evaluation for CABG. Resume heparin at 6 PM.  Patient Profile     76 y.o. male with CAD status post RCA PCI in 2003, GERD, hypertension, hypothyroidism, hyperlipidemia who was admitted on 07/11/2021 for unstable angina, found to have multi-vessel disease - now post-CABG.  Assessment & Plan    Coronary artery disease status post CABG-no angina symptoms.  Doing well post CABG.  Continue aspirin statin and beta-blocker for  Paroxysmal atrial fibrillation-postoperative-in atrial fibrillation yesterday and overnight back in sinus rhythm.  We will keep him on his current dose of beta-blocker.  Recommend placing the patient back on amiodarone 200 mg twice daily for now.  Hypertension-continue current antihypertensive medication regimen.  Hypothyroidism-Per primary team.  Mild AS-asymptomatic     For questions or updates, please contact [place] Please consult [email or web address] for contact  info under Cardiology/STEMI.      Signed, Berniece Salines, DO  07/20/2021, 12:40 PM

## 2021-07-20 NOTE — TOC Benefit Eligibility Note (Signed)
Patient Teacher, English as a foreign language completed.    The patient is currently admitted and upon discharge could be taking Eliquis 5 mg.  The current 30 day co-pay is, $552.00 due to a $505.00 deductible.  Will be $47.00 a month after reaching deductible.   The patient is insured through [place], [place] Patient Advocate Specialist [place] Patient Advocate Team Direct Number: [phone]  Fax: [phone]

## 2021-07-21 ENCOUNTER — Other Ambulatory Visit (HOSPITAL_COMMUNITY): Payer: Self-pay

## 2021-07-21 DIAGNOSIS — I200 Unstable angina: Secondary | ICD-10-CM | POA: Diagnosis not present

## 2021-07-21 LAB — CBC
HCT: 31 % — ABNORMAL LOW (ref 39.0–52.0)
Hemoglobin: 9.9 g/dL — ABNORMAL LOW (ref 13.0–17.0)
MCH: 29.2 pg (ref 26.0–34.0)
MCHC: 31.9 g/dL (ref 30.0–36.0)
MCV: 91.4 fL (ref 80.0–100.0)
Platelets: 188 10*3/uL (ref 150–400)
RBC: 3.39 MIL/uL — ABNORMAL LOW (ref 4.22–5.81)
RDW: 13.5 % (ref 11.5–15.5)
WBC: 8.6 10*3/uL (ref 4.0–10.5)
nRBC: 0 % (ref 0.0–0.2)

## 2021-07-21 LAB — BASIC METABOLIC PANEL
Anion gap: 14 (ref 5–15)
BUN: 15 mg/dL (ref 8–23)
CO2: 22 mmol/L (ref 22–32)
Calcium: 8.2 mg/dL — ABNORMAL LOW (ref 8.9–10.3)
Chloride: 98 mmol/L (ref 98–111)
Creatinine, Ser: 0.86 mg/dL (ref 0.61–1.24)
GFR, Estimated: >60 mL/min (ref >60)
Glucose, Bld: 90 mg/dL (ref 70–99)
Potassium: 3.6 mmol/L (ref 3.5–5.1)
Sodium: 134 mmol/L — ABNORMAL LOW (ref 135–145)

## 2021-07-21 LAB — MAGNESIUM: Magnesium: 1.9 mg/dL (ref 1.7–2.4)

## 2021-07-21 MED ORDER — ASPIRIN 81 MG PO TBEC
81.0000 mg | DELAYED_RELEASE_TABLET | Freq: Every day | ORAL | 11 refills | Status: AC
  Filled 2021-07-21: qty 30, 30d supply, fill #0

## 2021-07-21 MED ORDER — TRAMADOL HCL 50 MG PO TABS
50.0000 mg | ORAL_TABLET | Freq: Four times a day (QID) | ORAL | 0 refills | Status: AC | PRN
  Filled 2021-07-21: qty 28, 7d supply, fill #0

## 2021-07-21 MED ORDER — POTASSIUM CHLORIDE CRYS ER 20 MEQ PO TBCR
20.0000 meq | EXTENDED_RELEASE_TABLET | Freq: Every day | ORAL | 0 refills | Status: AC
  Filled 2021-07-21: qty 5, 5d supply, fill #0

## 2021-07-21 MED ORDER — DRONEDARONE HCL 400 MG PO TABS
400.0000 mg | ORAL_TABLET | Freq: Two times a day (BID) | ORAL | 2 refills | Status: AC
Start: 2021-07-21 — End: ?
  Filled 2021-07-21: qty 60, 30d supply, fill #0

## 2021-07-21 MED ORDER — FUROSEMIDE 40 MG PO TABS
40.0000 mg | ORAL_TABLET | Freq: Every day | ORAL | 0 refills | Status: AC
  Filled 2021-07-21: qty 5, 5d supply, fill #0

## 2021-07-21 MED ORDER — METOPROLOL TARTRATE 25 MG PO TABS
25.0000 mg | ORAL_TABLET | Freq: Two times a day (BID) | ORAL | 2 refills | Status: AC
Start: 2021-07-21 — End: ?
  Filled 2021-07-21: qty 60, 30d supply, fill #0

## 2021-07-21 MED ORDER — APIXABAN 5 MG PO TABS
5.0000 mg | ORAL_TABLET | Freq: Two times a day (BID) | ORAL | 2 refills | Status: AC
  Filled 2021-07-21: qty 60, 30d supply, fill #0

## 2021-07-21 NOTE — Discharge Summary (Addendum)
Physician Discharge Summary  Patient ID: Brian Sullivan MRN: [id number] DOB/AGE: 09/12/1945 76 y.o.  Admit date: 07/11/2021 Discharge date: 07/21/2021  Admission Diagnoses:  Multivessel coronary artery disease Unstable angina (HCC) Hyperlipemia CAD (coronary artery disease) Hypertension   Discharge Diagnoses:   Multivessel coronary artery disease Unstable angina (HCC) Hyperlipemia CAD (coronary artery disease) Hypertension S/P CABG x 3 Expected acute blood loss anemia Postoperative paroxysmal atrial fibrillation    Discharged Condition: stable  History of Present Illness:  Patient examined, images of today's coronary arteriograms and transthoracic 2D echocardiogram personally reviewed and discussed with patient and his wife Brian Sullivan.  Very nice 76 year old retired nondiabetic with prior history of CAD and PCI of the distal RCA in 2003 at outside hospital.  He had recurrent severe chest pain while shopping with his wife 2 days ago and was brought to the hospital by EMT and admitted to cardiology for acute coronary syndrome and probable non-STEMI.  Today cardiac catheterization showed progression of disease with too tight 90% lesions in the proximal large dominant RCA prior to the previous stent which is still patent.  The LAD has an 85% mid stenosis in a nondominant circumflex has 80 to 90% stenosis of the circumflex marginal.  Ventriculogram was not performed but LVEDP was normal.  An echocardiogram with poor images showed a known calcified aortic valve with probable minimal aortic stenosis and a low gradient and good LV systolic function.  The patient is currently comfortable on IV heparin in sinus rhythm.   Surgical assessment of this individual patient demonstrates adequate cardiac function and adequate targets of the RCA and LAD and possibly the circumflex.  His pulmonary function and renal function also appear to be adequate.  He has had no previous history of stroke but has had  significant migraines and previous brain MRI shows some scarring related to childbirth issues.  Overall he does appear rather fragile and has had several falls recently.  He suffers from vertigo and uses a cane to walk.  I expect him to have fairly prolonged weakness and a prolonged recovery due to his preoperative functional status.  He also complains of irritable bowel syndrome and would be at high risk for postoperative ileus.  Overall the patient would obtain significant benefit from multivessel CABG and proceeding with surgery would be his best option with the understanding that his recovery will probably be prolonged.  The patient's wife is a NP and will be an excellent resource for the patient. Surgical was scheduled for a.m. January 6.  Hospital Course:  Patient remained stable following left heart catheterization.  Further work-up included a CTA of the chest that showed no significant great vessel disease.  Carotid Dopplers did not show any obstructive cerebrovascular disease. Mr. Brian Sullivan was taken to the operating room on 07/15/2021 where CABG x3 was carried out without complication.  Following the procedure, he separated from cardiopulmonary bypass on milrinone.  He was transferred to the surgical ICU in stable condition. He was extubated later the evening of surgery. He was on Milrinone drip post op. EPW and chest tubes were removed on post op day 2 and he was transferred to 4E Progressive Care.  He progressed well with mobility. He developed atrial fibrillation early on post-op day 3 and was loaded with IV amiodarone. Within a few hours he converted back to stable sinus rhythm. The IV amiodarone was continued for 24 hours then transitioned to the oral form. He developed persistent nausea after conversion to the oral amiodarone so this was  discontinued. A KUB showed no evidence of obstruction.  After consultation with cardiology regarding management of the a-fib, he was started on Multaq and was  anticoagulated with Eliquis. His nausea and abdominal discomfort resolved. He progressed with physical therapy and regained independence with ambulation and transfers. He was ready for discharge on 07/21/21 and Dr. Harriet Masson, cardiology consultant, agreed.   Consults: cardiology  Significant Diagnostic Studies:   CLINICAL DATA:  Abdomen pain   EXAM: PORTABLE ABDOMEN - 1 VIEW   COMPARISON:  None.   FINDINGS: Mild air-filled small and large bowel. No definitive obstruction. No radiopaque calculi. Left hip replacement   IMPRESSION: Mild air-filled small and large bowel without obstructive pattern, question mild ileus     Electronically Signed   By: Donavan Foil M.D.   On: 07/19/2021 17:27  CLINICAL DATA:  Status post coronary bypass graft.   EXAM: CHEST - 2 VIEW   COMPARISON:  July 17, 2021.   FINDINGS: Stable cardiomegaly. Sternotomy wires are noted. Small bilateral pleural effusions are noted. No pneumothorax is noted. Mild left basilar subsegmental atelectasis is noted.   IMPRESSION: Small bilateral pleural effusions. Mild left basilar subsegmental atelectasis.     Electronically Signed   By: Marijo Conception M.D.   On: 07/18/2021 09:04    Treatments:  Surgery  Operative Report    DATE OF PROCEDURE: 07/15/2021     PROCEDURES PERFORMED:   1.  Coronary artery bypass grafting x3 (left internal mammary artery to LAD, saphenous vein graft to distal RCA, saphenous vein graft to ramus intermediate).  2.  Endoscopic harvest of right leg greater saphenous vein.   SURGEON:  Len Childs, MD   FIRST ASSISTANT: Enid Cutter, PA-C And experienced assistant was required for the procedure given the standard of surgical care and the complexity of the operation.  The first assistant was needed for exposure, dissection, retraction, accurate suctioning, and keeping anastomotic sutures properly aligned.   ANESTHESIA:  General by Dr. Albertha Ghee.   PREOPERATIVE  DIAGNOSIS:  Severe multivessel coronary artery disease with acute coronary syndrome and unstable angina.   POSTOPERATIVE DIAGNOSIS:  Severe multivessel coronary artery disease with acute coronary syndrome and unstable angina.    Discharge Exam: Blood pressure 137/63, pulse 82, temperature 98.7 F (37.1 C), temperature source Oral, resp. rate 20, height 5\' 11"  (1.803 m), weight 92.8 kg, SpO2 97 %.   General appearance: alert, cooperative, and no distress Neurologic: intact Heart: In SR with frequent PVC's currently, last episode of a-fib on the monitor was around 7pm last night.  Lungs: breath sounds clear, diminished.  Abdomen: soft, no tenderness Extremities: expected bruising right thigh. The right EVH incision is intact and dry. LE edema improving.   Wound: the sternotomy incision is well approximated and dry.   Disposition: Discharged to home in stable condition with home health PT.   Allergies as of 07/21/2021       Reactions   Gemfibrozil Other (See Comments)   Pantoprazole Other (See Comments)   Esomeprazole is the only PPI that works per patient   Paroxetine Hcl Itching   Statins Other (See Comments)   Leg pain   Sulfamethoxazole Other (See Comments)   Nausea, leg pain         Medication List     STOP taking these medications    diclofenac 75 MG EC tablet Commonly known as: VOLTAREN   losartan 100 MG tablet Commonly known as: COZAAR   losartan 50 MG tablet Commonly known  as: COZAAR   propranolol 10 MG tablet Commonly known as: INDERAL       TAKE these medications    amitriptyline 25 MG tablet Commonly known as: ELAVIL Take 25 mg by mouth in the morning and at bedtime.   apixaban 5 MG Tabs tablet Commonly known as: ELIQUIS Take 1 tablet (5 mg total) by mouth 2 (two) times daily.   aspirin 81 MG EC tablet Take 1 tablet (81 mg total) by mouth daily. Swallow whole.   dronedarone 400 MG tablet Commonly known as: MULTAQ Take 1 tablet (400 mg  total) by mouth 2 (two) times daily with a meal.   esomeprazole 40 MG capsule Commonly known as: NEXIUM Take 40 mg by mouth 2 (two) times daily before a meal.   furosemide 40 MG tablet Commonly known as: LASIX Take 1 tablet (40 mg total) by mouth daily for 5 days. Start taking on: July 22, 2021   levothyroxine 50 MCG tablet Commonly known as: SYNTHROID Take 50 mcg by mouth daily.   metoprolol tartrate 25 MG tablet Commonly known as: LOPRESSOR Take 1 tablet (25 mg total) by mouth 2 (two) times daily.   potassium chloride SA 20 MEQ tablet Commonly known as: KLOR-CON M Take 1 tablet (20 mEq total) by mouth daily for 5 days.   pravastatin 20 MG tablet Commonly known as: PRAVACHOL Take 20 mg by mouth daily.   tadalafil 5 MG tablet Commonly known as: CIALIS Take 5 mg by mouth daily as needed for erectile dysfunction.   traMADol 50 MG tablet Commonly known as: ULTRAM Take 1 tablet (50 mg total) by mouth every 6 (six) hours as needed for up to 7 days for moderate pain.   Vitamin D (Ergocalciferol) 1.25 MG (50000 UNIT) Caps capsule Commonly known as: DRISDOL Take 50,000 Units by mouth every 7 (seven) days.        Follow-up Information     [name], PA-C. Go on 08/09/2021.   Specialties: Physician Assistant, Cardiology Why: Your appointment is at 1pm. Contact information: [address] [place] [address] [phone]         Dahlia Byes, MD. Go on 08/15/2021.   Specialty: Cardiothoracic Surgery Why: Your appointment is at 1pm.  Please arrive 30 minutes early for a chest x-ray to be performed by [facility] located on the first floor of the same buildiing.        [place]. Follow up.   Specialty: Sisters Why: HHRN/PT arranged- they will contact you to schedule home visits Contact information: [place] [address] [phone]                 The patient has been  discharged on:   1.Beta Blocker:  Yes [ x ]                              No   [   ]                              If No, reason:  2.Ace Inhibitor/ARB: Yes [   ]                                     No  [  x  ]  If No, reason: BP labile  3.Statin:   Yes [ x  ]                  No  [   ]                  If No, reason:  4.Ecasa:  Yes  [ x  ]                  No   [   ]                  If No, reason:   Signed: Antony Odea, PA-C  07/21/2021, 11:47 AM  patient examined and medical record reviewed,agree with above note. Dahlia Byes 07/27/2021

## 2021-07-21 NOTE — TOC Transition Note (Signed)
Transition of Care (TOC) - CM/SW Discharge Note Marvetta Gibbons RN, BSN Transitions of Care Unit 4E- RN Case Manager See Treatment Team for direct phone #    Patient Details  Name: Brian Sullivan MRN: [id number] Date of Birth: Oct 21, 1945  Transition of Care [facility]) CM/SW Contact:  Dawayne Patricia, RN Phone Number: 07/21/2021, 12:03 PM   Clinical Narrative:    Pt has been cleared for transition home today, [place] has been arranged previously with [facility]- Call made to [facility] to notify of discharge home today for start of care- HHRN/PT.  Wife to transport home, pt has been started on Multaq- benefits check completed and pt has high out of pocket cost due to deductible of $500 not being met yet. Cards following.    Final next level of care: [place] Barriers to Discharge: Barriers Resolved   Patient Goals and CMS Choice Patient states their goals for this hospitalization and ongoing recovery are:: return home CMS Medicare.gov Compare Post Acute Care list provided to:: Patient Choice offered to / list presented to : Patient, Spouse  Discharge Placement                 Home w/ [facility]      Discharge Plan and Services   Discharge Planning Services: CM Consult Post Acute Care Choice: Durable Medical Equipment, Home Health          DME Arranged: N/A DME Agency: NA       HH Arranged: RN, PT [place] Agency: [place] [facility] Date [place]: 07/18/21 Time [place]: 1430 Representative spoke with at [place]: [place] ([place]) Interventions     Readmission Risk Interventions Readmission Risk Prevention Plan 07/21/2021  Post Dischage Appt Complete  Medication Screening Complete  Transportation Screening Complete  Some recent data might be hidden

## 2021-07-21 NOTE — Progress Notes (Signed)
Discharge instructions (including medications) discussed with and copy provided to patient/caregiver 

## 2021-07-21 NOTE — Progress Notes (Signed)
Progress Note  Patient Name: Brian Sullivan Date of Encounter: 07/21/2021  Primary Cardiologist: Berniece Salines, DO   Subjective   Patient was seen and examined at his bedside.  His wife and his sister-in-law both by the bedside.  He offers no complaints at this time.  He tells me he slept very well.  Inpatient Medications    Scheduled Meds:  amitriptyline  25 mg Oral QHS   apixaban  5 mg Oral BID   aspirin EC  81 mg Oral Daily   bisacodyl  10 mg Oral Daily   Or   bisacodyl  10 mg Rectal Daily   docusate sodium  200 mg Oral Daily   dronedarone  400 mg Oral BID WC   escitalopram  10 mg Oral QHS   esomeprazole  40 mg Oral BID AC   feeding supplement  237 mL Oral TID WC   furosemide  40 mg Oral Daily   levothyroxine  50 mcg Oral Q0600   metoprolol tartrate  25 mg Oral TID   potassium chloride  40 mEq Oral Daily   pravastatin  20 mg Oral Daily   sodium chloride flush  3 mL Intravenous Q12H   Continuous Infusions:  PRN Meds: alum & mag hydroxide-simeth, metoprolol tartrate, ondansetron (ZOFRAN) IV, oxyCODONE, sodium chloride flush, traMADol   Vital Signs    Vitals:   07/20/21 2000 07/21/21 0046 07/21/21 0600 07/21/21 0809  BP: 124/69 109/67 125/67 137/63  Pulse: (!) 108 73 73 82  Resp: 20 20 16 20   Temp: 98 F (36.7 C) 97.7 F (36.5 C) 98 F (36.7 C) 98.7 F (37.1 C)  TempSrc: Oral Oral Oral Oral  SpO2: 94% 100% 97% 97%  Weight:      Height:        Intake/Output Summary (Last 24 hours) at 07/21/2021 1056 Last data filed at 07/20/2021 1600 Gross per 24 hour  Intake --  Output 1300 ml  Net -1300 ml   Filed Weights   07/16/21 0500 07/17/21 0500 07/20/21 6063  Weight: 97 kg 97 kg 92.8 kg    Telemetry    Sinus rhythm- Personally Reviewed  ECG    None today- Personally Reviewed  Physical Exam    General: Comfortable, sitting up in bed. Head: Atraumatic, normal size  Eyes: PEERLA, EOMI  Neck: Supple, normal JVD Cardiac: Normal S1, S2; RRR; no murmurs,  rubs, or gallops Lungs: Clear to auscultation bilaterally Abd: Soft, nontender, no hepatomegaly  Ext: warm, no edema Musculoskeletal: No deformities, BUE and BLE strength normal and equal Skin: Warm and dry, no rashes   Neuro: Alert and oriented to person, place, time, and situation, CNII-XII grossly intact, no focal deficits  Psych: Normal mood and affect   Labs    Chemistry Recent Labs  Lab 07/19/21 0332 07/20/21 0500 07/21/21 0240  NA 135 135 134*  K 3.8 4.0 3.6  CL 99 99 98  CO2 28 28 22   GLUCOSE 99 88 90  BUN 12 12 15   CREATININE 0.96 0.96 0.86  CALCIUM 8.2* 8.0* 8.2*  GFRNONAA >60 >60 >60  ANIONGAP 8 8 14      Hematology Recent Labs  Lab 07/18/21 0202 07/19/21 0332 07/21/21 0240  WBC 12.8* 10.6* 8.6  RBC 3.71* 3.57* 3.39*  HGB 11.3* 10.7* 9.9*  HCT 34.2* 32.8* 31.0*  MCV 92.2 91.9 91.4  MCH 30.5 30.0 29.2  MCHC 33.0 32.6 31.9  RDW 13.3 13.3 13.5  PLT 122* 138* 188    Cardiac EnzymesNo  results for input(s): TROPONINI in the last 168 hours. No results for input(s): TROPIPOC in the last 168 hours.   BNPNo results for input(s): BNP, PROBNP in the last 168 hours.   DDimer No results for input(s): DDIMER in the last 168 hours.   Radiology    DG Abd Portable 1V  Result Date: 07/19/2021 CLINICAL DATA:  Abdomen pain EXAM: PORTABLE ABDOMEN - 1 VIEW COMPARISON:  None. FINDINGS: Mild air-filled small and large bowel. No definitive obstruction. No radiopaque calculi. Left hip replacement IMPRESSION: Mild air-filled small and large bowel without obstructive pattern, question mild ileus Electronically Signed   By: Donavan Foil M.D.   On: 07/19/2021 17:27    Cardiac Studies   LHC pre-CABG 07/13/2021   Prox RCA lesion is 90% stenosed.   Prox RCA to Mid RCA lesion is 90% stenosed.   Dist RCA lesion is 30% stenosed.   Prox Cx lesion is 40% stenosed.   2nd Mrg lesion is 80% stenosed.   Mid Cx lesion is 90% stenosed.   Mid LAD lesion is 85% stenosed.   Ost LAD to  Prox LAD lesion is 50% stenosed.   1.  Severe heavily calcified three-vessel coronary artery disease. 2.  Left ventricular angiography was not performed.  EF was 50 to 55% by echo.  Normal left ventricular end-diastolic pressure at 6 mmHg. 3.  Mild aortic stenosis with peak to peak gradient of 16 mmHg. 3.  Very difficult procedure via the right radial artery due to tortuosity of the innominate artery and difficult engagement of the coronary arteries in spite of using a long sheath.   Recommendations: Given diffuse calcified three-vessel coronary artery disease, recommend evaluation for CABG. Resume heparin at 6 PM.  Patient Profile     76 y.o. male with hx of CAD status post RCA PCI in 2003, GERD, hypertension, hypothyroidism, hyperlipidemia who was admitted on 07/11/2021 for unstable angina, found to have multi-vessel disease - now post-CABG.   Assessment & Plan    Coronary artery disease status post CABG-no angina symptoms.  Doing well post CABG.  Continue aspirin statin and beta-blocker for  Paroxysmal atrial fibrillation-postoperative-was in atrial fibrillation, recommended use of amiodarone however patient notes that this medication has caused acutely abdominal pain prior and did not want to take this medication further.  Switch to Multaq 400 mg twice daily.  He tolerated this medication overnight no reported adverse effects.  We will keep him on this medication for now.     Hypertension-continue current antihypertensive medication regimen.  Hypothyroidism-Per primary team.  Mild AS-asymptomatic  I do agree the patient can be discharged home.  [facility] will sign off.   Medication Recommendations:  Dronedarone (Multaq) 400 mg twice a day, Eliquis 5 mg daily, Lasix 40 mg every other day, KCl 40 mEq every other day, metoprolol tartrate 25 mg every 8 hours. Other recommendations (labs, testing, etc): None Follow up as an outpatient: The patient has been scheduled on August 09, 2020, 10:05 AM at our [employer] with Sande Rives, [place].  For questions or updates, please contact [place] Please consult [email or web address] for contact info under Cardiology/STEMI.      Signed, Berniece Salines, DO  07/21/2021, 10:56 AM

## 2021-07-21 NOTE — Plan of Care (Signed)
°  Problem: Acute Rehab PT Goals(only PT should resolve) Goal: Pt Will Go Supine/Side To Sit Outcome: Adequate for Discharge Goal: Patient Will Transfer Sit To/From Stand Outcome: Adequate for Discharge Goal: Pt Will Ambulate Outcome: Adequate for Discharge Goal: Pt Will Go Up/Down Stairs Outcome: Adequate for Discharge

## 2021-07-21 NOTE — TOC Benefit Eligibility Note (Signed)
Patient Teacher, English as a foreign language completed.    The patient is currently admitted and upon discharge could be taking Multaq 400 mg.  The current 30 day co-pay is, $671.94 due to a $505.00 deductible.   The patient is insured through [place], [place] Patient Advocate Specialist [place] Patient Advocate Team Direct Number: [phone]  Fax: [phone]

## 2021-07-21 NOTE — Progress Notes (Addendum)
[address]       [place] [address]             [phone]      6 Days Post-Op Procedure(s) (LRB): CORONARY ARTERY BYPASS GRAFTING (CABG) X 3, USING LEFT INTERNAL MAMMARY ARTERY, ENDOSCOPICALLY HARVESTED RIGHT GREATER SAPHENOUS VEIN (N/A) TRANSESOPHAGEAL ECHOCARDIOGRAM (TEE) (N/A) ENDOVEIN HARVEST OF GREATER SAPHENOUS VEIN (Right) Subjective:  Awake and alert, up in the bedside chair with his wife at the bedside.  Says he had a better day yesterday.  No new concerns.  He is ambulating and transferring with minimal assistance. Abdominal discomfort seems to have resolved.   Objective: Vital signs in last 24 hours: Temp:  [97.7 F (36.5 C)-98.8 F (37.1 C)] 98 F (36.7 C) (01/12 0600) Pulse Rate:  [73-132] 73 (01/12 0600) Cardiac Rhythm: Normal sinus rhythm (01/11 2058) Resp:  [16-21] 16 (01/12 0600) BP: (102-146)/(67-106) 125/67 (01/12 0600) SpO2:  [92 %-100 %] 97 % (01/12 0600)     Intake/Output from previous day: 01/11 0701 - 01/12 0700 In: -  Out: 1300 [Urine:1300] Intake/Output this shift: No intake/output data recorded.  General appearance: alert, cooperative, and no distress Neurologic: intact Heart: In SR with frequent PVC's currently, last episode of a-fib on the monitor was around 7pm last night.  Lungs: breath sounds clear, diminished.  Abdomen: soft, no tenderness Extremities: expected bruising right thigh. The right EVH incision is intact and dry. LE edema improving.   Wound: the sternotomy incision is well approximated and dry.   Lab Results: Recent Labs    07/19/21 0332 07/21/21 0240  WBC 10.6* 8.6  HGB 10.7* 9.9*  HCT 32.8* 31.0*  PLT 138* 188    BMET:  Recent Labs    07/20/21 0500 07/21/21 0240  NA 135 134*  K 4.0 3.6  CL 99 98  CO2 28 22  GLUCOSE 88 90  BUN 12 15  CREATININE 0.96 0.86  CALCIUM 8.0* 8.2*     PT/INR:  No results for input(s): LABPROT, INR in the last 72 hours.  ABG    Component Value  Date/Time   PHART 7.325 (L) 07/15/2021 2301   HCO3 22.4 07/15/2021 2301   TCO2 24 07/15/2021 2301   ACIDBASEDEF 4.0 (H) 07/15/2021 2301   O2SAT 91.0 07/15/2021 2301   CBG (last 3)  Recent Labs    07/19/21 0602 07/19/21 1211 07/19/21 1628  GLUCAP 118* 100* 123*     Assessment/Plan: S/P Procedure(s) (LRB): CORONARY ARTERY BYPASS GRAFTING (CABG) X 3, USING LEFT INTERNAL MAMMARY ARTERY, ENDOSCOPICALLY HARVESTED RIGHT GREATER SAPHENOUS VEIN (N/A) TRANSESOPHAGEAL ECHOCARDIOGRAM (TEE) (N/A) ENDOVEIN HARVEST OF GREATER SAPHENOUS VEIN (Right)  -POD6 CABG x 3 for MVCAD presenting with exertional angina, EF 50-55%.  On  ASA, metoprolol 25mg  TID, pravastatin 20mg  QD. Now ambulating and able to transfer with minimal assistance.   -Post-op atrial fibrillation- did not tolerate amiodarone due to GI side effects. Cardiology consulted, Multaq started last evening.  No AF recorded over night.   -Expected acute blood loss anemia- mild, Hct 31%.  -Volume excess- no new Wt today, still has some LE edema. Will continue Lasix for a few days after discharge.   -GI- nausea and abdominal discomfort resolved.   H/O GERD and IBS. Continue PPI. Diet as tolerated.    -PULM- on RA with adequate sats.  Continue to encourage pulmonary hygiene.  -ENDO- no h/o DM. Does have H/O hypothyroidism, Synthroid resumed.   -Disposition- Planning for discharge to home today  with home PT if cardiology agrees (new antiarrhythmic started yesterday).  Has a walker at home.    LOS: 9 days    Malon Kindle [phone] 07/21/2021  Patient doing well in NSR on Multitaq DC instructions reviewed with patient Will DC home after evaluated by Cardiology- appreciate recommendations  patient examined and medical record reviewed,agree with above note. Dahlia Byes 07/21/2021

## 2021-07-21 NOTE — Progress Notes (Signed)
CARDIAC REHAB PHASE I   Discussed with pt and wife IS, sternal precautions, diet, exercise, and CPRII. Will refer to [place].  [place], ACSM 07/21/2021 12:33 PM

## 2021-07-21 NOTE — Care Management Important Message (Signed)
Important Message  Patient Details  Name: Brian Sullivan MRN: [id number] Date of Birth: Dec 14, 1945   Medicare Important Message Given:  Yes     Shelda Altes 07/21/2021, 7:52 AM

## 2021-07-22 ENCOUNTER — Telehealth: Payer: Self-pay | Admitting: Student

## 2021-07-22 DIAGNOSIS — Z951 Presence of aortocoronary bypass graft: Secondary | ICD-10-CM | POA: Diagnosis not present

## 2021-07-22 NOTE — Telephone Encounter (Signed)
° °  Patienf's wife called Answering Service with concerns about BP. Patient discharged yesterday from the hospital after CABG. Called and spoke with wife and patient. Wife has been a family NP for the past 30 years. She reports patient has been orthostatic today. Patient denies any symptoms at rest but notes lightheadedness/dizziness with standing. He was working with PT today and almost passed out after standing. Systolic BP was in the 35H to 70s at that time. BP improved with sitting. Wife states he did not eat or drink much during his hospital stay. Wife also reports some dyspnea when he got up to walk today. He denies any chest pain. She was worried that he may be back in atrial fibrillation but heart rates in the 60s and she states pulse felt regular. He was discharged on Lopressor 25mg  twice daily and Lasix 40mg  daily for 5 days. Recommended holding Lopressor and Lasix for the next 2 days and increasing PO intake. Also recommended compression stockings to help with some of his lower extremity edema after surgery as well as his orthostatic symptoms. Advised patient/wife to call our office and let us know if he still orthostatic on Monday. Also discussed that the [facility] device can be helpful in assessing for recurrent atrial fibrillation. Wife voiced understanding and thanked me for calling.  [name], PA-C 07/22/2021 6:01 PM

## 2021-07-25 ENCOUNTER — Telehealth: Payer: Self-pay

## 2021-07-25 ENCOUNTER — Telehealth: Payer: Self-pay | Admitting: Cardiology

## 2021-07-25 ENCOUNTER — Encounter: Payer: Self-pay | Admitting: Cardiology

## 2021-07-25 NOTE — Telephone Encounter (Signed)
Jonelle Sidle, RN with [facility] contacted the office [phone] requesting home health nursing orders for 2week x4, 2week x2. Also, orders for home health aid to help with bathing and physical therapy orders for evaluation and treatment. Verbal orders given.  Will await faxed copy for physician signature.

## 2021-07-25 NOTE — Telephone Encounter (Signed)
Called patient, spoke with wife. She states that they spoke with [facility] on Friday night, who suggested to hold a few medications due to hypotensive episodes. Patient wife states that his BP and hypotensive episodes have improved, however they are now experiencing nausea and feeling "very poorly" because of the Multaq. They were unable to take Amiodarone due to the same issues, and now they are experiencing them again, he has not had the Multaq since 7:00 PM last night, none today. They would like to try to restart lopressor if possible. They states his BP this morning was 123/67 HR was 109. They did get a [facility] and was using it while on the phone with me, states it shows he is AFIB, HR running 120-150. I did suggest if things worsened or symptoms did get worse to go to ED. Wife states she is monitoring her husband at home. They will send over the [facility] device to review.  I advised I would send a message to MD to review further, and receive any recommendations.

## 2021-07-25 NOTE — Telephone Encounter (Signed)
Pt c/o medication issue:  1. Name of Medication: dronedarone (MULTAQ) 400 MG tablet  2. How are you currently taking this medication (dosage and times per day)? 1 tablet twice a day  3. Are you having a reaction (difficulty breathing--STAT)? no  4. What is your medication issue? Patient's wife states they spoke with [facility] Friday night and she stopped a couple medications due to his reaction to the medication. She states the patient is having severe GI distress and is nauseas and vomiting. She says he does seem stable with his vitals but he cannot take this medication.

## 2021-07-26 ENCOUNTER — Telehealth (HOSPITAL_COMMUNITY): Payer: Self-pay

## 2021-07-26 NOTE — Telephone Encounter (Signed)
Per phase I cardiac rehab, fax cardiac rehab referral to [facility]. °

## 2021-07-29 NOTE — Telephone Encounter (Signed)
I believe I address this prior in a mychart message. Please call the patient. He should be off multaq and back lopressor.

## 2021-07-30 ENCOUNTER — Other Ambulatory Visit: Payer: Self-pay

## 2021-07-30 ENCOUNTER — Telehealth: Payer: Self-pay | Admitting: Physician Assistant

## 2021-07-30 ENCOUNTER — Emergency Department (HOSPITAL_COMMUNITY): Payer: Medicare Other

## 2021-07-30 ENCOUNTER — Encounter (HOSPITAL_COMMUNITY): Payer: Self-pay

## 2021-07-30 ENCOUNTER — Inpatient Hospital Stay (HOSPITAL_COMMUNITY)
Admission: EM | Admit: 2021-07-30 | Discharge: 2021-08-10 | DRG: 314 | Disposition: E | Payer: Medicare Other | Attending: Internal Medicine | Admitting: Internal Medicine

## 2021-07-30 DIAGNOSIS — R110 Nausea: Secondary | ICD-10-CM | POA: Diagnosis not present

## 2021-07-30 DIAGNOSIS — I462 Cardiac arrest due to underlying cardiac condition: Secondary | ICD-10-CM | POA: Diagnosis not present

## 2021-07-30 DIAGNOSIS — I97190 Other postprocedural cardiac functional disturbances following cardiac surgery: Principal | ICD-10-CM | POA: Diagnosis present

## 2021-07-30 DIAGNOSIS — R3912 Poor urinary stream: Secondary | ICD-10-CM | POA: Diagnosis not present

## 2021-07-30 DIAGNOSIS — F32A Depression, unspecified: Secondary | ICD-10-CM | POA: Diagnosis present

## 2021-07-30 DIAGNOSIS — F419 Anxiety disorder, unspecified: Secondary | ICD-10-CM | POA: Diagnosis present

## 2021-07-30 DIAGNOSIS — K589 Irritable bowel syndrome without diarrhea: Secondary | ICD-10-CM | POA: Diagnosis present

## 2021-07-30 DIAGNOSIS — I480 Paroxysmal atrial fibrillation: Secondary | ICD-10-CM | POA: Diagnosis present

## 2021-07-30 DIAGNOSIS — Z79890 Hormone replacement therapy (postmenopausal): Secondary | ICD-10-CM

## 2021-07-30 DIAGNOSIS — R1032 Left lower quadrant pain: Secondary | ICD-10-CM | POA: Diagnosis not present

## 2021-07-30 DIAGNOSIS — Z951 Presence of aortocoronary bypass graft: Secondary | ICD-10-CM

## 2021-07-30 DIAGNOSIS — Z96642 Presence of left artificial hip joint: Secondary | ICD-10-CM | POA: Diagnosis present

## 2021-07-30 DIAGNOSIS — G809 Cerebral palsy, unspecified: Secondary | ICD-10-CM | POA: Diagnosis present

## 2021-07-30 DIAGNOSIS — I3139 Other pericardial effusion (noninflammatory): Secondary | ICD-10-CM | POA: Diagnosis not present

## 2021-07-30 DIAGNOSIS — Y838 Other surgical procedures as the cause of abnormal reaction of the patient, or of later complication, without mention of misadventure at the time of the procedure: Secondary | ICD-10-CM | POA: Diagnosis present

## 2021-07-30 DIAGNOSIS — Z85828 Personal history of other malignant neoplasm of skin: Secondary | ICD-10-CM | POA: Diagnosis not present

## 2021-07-30 DIAGNOSIS — I2510 Atherosclerotic heart disease of native coronary artery without angina pectoris: Secondary | ICD-10-CM | POA: Diagnosis present

## 2021-07-30 DIAGNOSIS — E039 Hypothyroidism, unspecified: Secondary | ICD-10-CM | POA: Diagnosis present

## 2021-07-30 DIAGNOSIS — Z20822 Contact with and (suspected) exposure to covid-19: Secondary | ICD-10-CM | POA: Diagnosis present

## 2021-07-30 DIAGNOSIS — I255 Ischemic cardiomyopathy: Secondary | ICD-10-CM | POA: Diagnosis not present

## 2021-07-30 DIAGNOSIS — R1314 Dysphagia, pharyngoesophageal phase: Secondary | ICD-10-CM | POA: Diagnosis present

## 2021-07-30 DIAGNOSIS — Z79899 Other long term (current) drug therapy: Secondary | ICD-10-CM

## 2021-07-30 DIAGNOSIS — E7800 Pure hypercholesterolemia, unspecified: Secondary | ICD-10-CM | POA: Diagnosis present

## 2021-07-30 DIAGNOSIS — Z882 Allergy status to sulfonamides status: Secondary | ICD-10-CM

## 2021-07-30 DIAGNOSIS — I110 Hypertensive heart disease with heart failure: Secondary | ICD-10-CM | POA: Diagnosis present

## 2021-07-30 DIAGNOSIS — I4891 Unspecified atrial fibrillation: Secondary | ICD-10-CM

## 2021-07-30 DIAGNOSIS — R0602 Shortness of breath: Secondary | ICD-10-CM

## 2021-07-30 DIAGNOSIS — E559 Vitamin D deficiency, unspecified: Secondary | ICD-10-CM | POA: Diagnosis present

## 2021-07-30 DIAGNOSIS — Z888 Allergy status to other drugs, medicaments and biological substances status: Secondary | ICD-10-CM

## 2021-07-30 DIAGNOSIS — Z9049 Acquired absence of other specified parts of digestive tract: Secondary | ICD-10-CM

## 2021-07-30 DIAGNOSIS — N401 Enlarged prostate with lower urinary tract symptoms: Secondary | ICD-10-CM | POA: Diagnosis not present

## 2021-07-30 DIAGNOSIS — G629 Polyneuropathy, unspecified: Secondary | ICD-10-CM | POA: Diagnosis present

## 2021-07-30 DIAGNOSIS — Z7982 Long term (current) use of aspirin: Secondary | ICD-10-CM

## 2021-07-30 DIAGNOSIS — Z7901 Long term (current) use of anticoagulants: Secondary | ICD-10-CM

## 2021-07-30 DIAGNOSIS — Z9861 Coronary angioplasty status: Secondary | ICD-10-CM

## 2021-07-30 DIAGNOSIS — K219 Gastro-esophageal reflux disease without esophagitis: Secondary | ICD-10-CM | POA: Diagnosis present

## 2021-07-30 DIAGNOSIS — I4901 Ventricular fibrillation: Secondary | ICD-10-CM | POA: Diagnosis not present

## 2021-07-30 DIAGNOSIS — J9601 Acute respiratory failure with hypoxia: Secondary | ICD-10-CM | POA: Diagnosis not present

## 2021-07-30 DIAGNOSIS — Z800 Family history of malignant neoplasm of digestive organs: Secondary | ICD-10-CM

## 2021-07-30 DIAGNOSIS — I472 Ventricular tachycardia, unspecified: Secondary | ICD-10-CM | POA: Diagnosis not present

## 2021-07-30 DIAGNOSIS — I5022 Chronic systolic (congestive) heart failure: Secondary | ICD-10-CM | POA: Diagnosis present

## 2021-07-30 DIAGNOSIS — R5383 Other fatigue: Secondary | ICD-10-CM | POA: Diagnosis present

## 2021-07-30 DIAGNOSIS — Z825 Family history of asthma and other chronic lower respiratory diseases: Secondary | ICD-10-CM

## 2021-07-30 DIAGNOSIS — Z8249 Family history of ischemic heart disease and other diseases of the circulatory system: Secondary | ICD-10-CM

## 2021-07-30 LAB — COMPREHENSIVE METABOLIC PANEL
ALT: 16 U/L (ref 0–44)
AST: 19 U/L (ref 15–41)
Albumin: 3.1 g/dL — ABNORMAL LOW (ref 3.5–5.0)
Alkaline Phosphatase: 85 U/L (ref 38–126)
Anion gap: 9 (ref 5–15)
BUN: 8 mg/dL (ref 8–23)
CO2: 25 mmol/L (ref 22–32)
Calcium: 8.9 mg/dL (ref 8.9–10.3)
Chloride: 101 mmol/L (ref 98–111)
Creatinine, Ser: 1.08 mg/dL (ref 0.61–1.24)
GFR, Estimated: >60 mL/min (ref >60)
Glucose, Bld: 105 mg/dL — ABNORMAL HIGH (ref 70–99)
Potassium: 4.2 mmol/L (ref 3.5–5.1)
Sodium: 135 mmol/L (ref 135–145)
Total Bilirubin: 0.8 mg/dL (ref 0.3–1.2)
Total Protein: 6.9 g/dL (ref 6.5–8.1)

## 2021-07-30 LAB — CBC WITH DIFFERENTIAL/PLATELET
Abs Immature Granulocytes: 0.26 10*3/uL — ABNORMAL HIGH (ref 0.00–0.07)
Basophils Absolute: 0.1 10*3/uL (ref 0.0–0.1)
Basophils Relative: 1 %
Eosinophils Absolute: 0.7 10*3/uL — ABNORMAL HIGH (ref 0.0–0.5)
Eosinophils Relative: 6 %
HCT: 37.7 % — ABNORMAL LOW (ref 39.0–52.0)
Hemoglobin: 12.2 g/dL — ABNORMAL LOW (ref 13.0–17.0)
Immature Granulocytes: 2 %
Lymphocytes Relative: 16 %
Lymphs Abs: 1.8 10*3/uL (ref 0.7–4.0)
MCH: 29.9 pg (ref 26.0–34.0)
MCHC: 32.4 g/dL (ref 30.0–36.0)
MCV: 92.4 fL (ref 80.0–100.0)
Monocytes Absolute: 1.1 10*3/uL — ABNORMAL HIGH (ref 0.1–1.0)
Monocytes Relative: 10 %
Neutro Abs: 7.4 10*3/uL (ref 1.7–7.7)
Neutrophils Relative %: 65 %
Platelets: 343 10*3/uL (ref 150–400)
RBC: 4.08 MIL/uL — ABNORMAL LOW (ref 4.22–5.81)
RDW: 13.8 % (ref 11.5–15.5)
WBC: 11.3 10*3/uL — ABNORMAL HIGH (ref 4.0–10.5)
nRBC: 0 % (ref 0.0–0.2)

## 2021-07-30 LAB — RESP PANEL BY RT-PCR (FLU A&B, COVID) ARPGX2
Influenza A by PCR: NEGATIVE
Influenza B by PCR: NEGATIVE
SARS Coronavirus 2 by RT PCR: NEGATIVE

## 2021-07-30 LAB — BRAIN NATRIURETIC PEPTIDE: B Natriuretic Peptide: 271.6 pg/mL — ABNORMAL HIGH (ref 0.0–100.0)

## 2021-07-30 LAB — TROPONIN I (HIGH SENSITIVITY)
Troponin I (High Sensitivity): 34 ng/L — ABNORMAL HIGH (ref <18)
Troponin I (High Sensitivity): 31 ng/L — ABNORMAL HIGH (ref <18)

## 2021-07-30 MED ORDER — DILTIAZEM HCL-DEXTROSE 125-5 MG/125ML-% IV SOLN (PREMIX): 5.0000 mg/h | INTRAVENOUS | Status: DC

## 2021-07-30 MED ORDER — ACETAMINOPHEN 325 MG PO TABS
650.0000 mg | ORAL_TABLET | ORAL | Status: DC | PRN
  Administered 2021-07-31 (×2): 650 mg via ORAL
  Filled 2021-07-30 (×2): qty 2

## 2021-07-30 MED ORDER — ASPIRIN EC 81 MG PO TBEC
81.0000 mg | DELAYED_RELEASE_TABLET | Freq: Every day | ORAL | Status: DC
  Administered 2021-07-31 – 2021-08-02 (×3): 81 mg via ORAL
  Filled 2021-07-30 (×3): qty 1

## 2021-07-30 MED ORDER — LEVOTHYROXINE SODIUM 50 MCG PO TABS
50.0000 ug | ORAL_TABLET | Freq: Every day | ORAL | Status: DC
  Administered 2021-07-31 – 2021-08-02 (×3): 50 ug via ORAL
  Filled 2021-07-30 (×3): qty 1

## 2021-07-30 MED ORDER — ONDANSETRON HCL 4 MG/2ML IJ SOLN
4.0000 mg | Freq: Once | INTRAMUSCULAR | Status: AC
Start: 2021-07-30 — End: 2021-07-30
  Administered 2021-07-30: 4 mg via INTRAVENOUS
  Filled 2021-07-30: qty 2

## 2021-07-30 MED ORDER — PRAVASTATIN SODIUM 10 MG PO TABS
20.0000 mg | ORAL_TABLET | Freq: Every day | ORAL | Status: DC
  Administered 2021-07-31 – 2021-08-01 (×2): 20 mg via ORAL
  Filled 2021-07-30 (×2): qty 2

## 2021-07-30 MED ORDER — APIXABAN 5 MG PO TABS
5.0000 mg | ORAL_TABLET | Freq: Two times a day (BID) | ORAL | Status: DC
  Administered 2021-07-30 – 2021-08-02 (×7): 5 mg via ORAL
  Filled 2021-07-30 (×7): qty 1

## 2021-07-30 MED ORDER — METOPROLOL TARTRATE 5 MG/5ML IV SOLN: 5.0000 mg | Freq: Once | INTRAVENOUS | Status: DC

## 2021-07-30 MED ORDER — ESOMEPRAZOLE MAGNESIUM 20 MG PO CPDR
20.0000 mg | DELAYED_RELEASE_CAPSULE | Freq: Two times a day (BID) | ORAL | Status: DC
  Administered 2021-07-31 – 2021-08-02 (×7): 20 mg via ORAL
  Filled 2021-07-30 (×8): qty 1

## 2021-07-30 MED ORDER — APIXABAN 5 MG PO TABS: 5.0000 mg | ORAL_TABLET | Freq: Two times a day (BID) | ORAL | Status: DC

## 2021-07-30 MED ORDER — SOTALOL HCL 80 MG PO TABS
80.0000 mg | ORAL_TABLET | Freq: Two times a day (BID) | ORAL | Status: DC
  Administered 2021-07-30 – 2021-08-01 (×4): 80 mg via ORAL
  Filled 2021-07-30 (×4): qty 1

## 2021-07-30 MED ORDER — AMITRIPTYLINE HCL 25 MG PO TABS
25.0000 mg | ORAL_TABLET | Freq: Two times a day (BID) | ORAL | Status: DC
  Administered 2021-07-31 – 2021-08-02 (×7): 25 mg via ORAL
  Filled 2021-07-30 (×9): qty 1

## 2021-07-30 MED ORDER — ETOMIDATE 2 MG/ML IV SOLN
9.0000 mg | Freq: Once | INTRAVENOUS | Status: AC
  Administered 2021-07-30: 9 mg via INTRAVENOUS

## 2021-07-30 MED ORDER — DILTIAZEM LOAD VIA INFUSION
10.0000 mg | Freq: Once | INTRAVENOUS | Status: DC
  Filled 2021-07-30: qty 10

## 2021-07-30 MED ORDER — ETOMIDATE 2 MG/ML IV SOLN
INTRAVENOUS | Status: AC | PRN
Start: 2021-07-30 — End: 2021-07-30
  Administered 2021-07-30: 9 mg via INTRAVENOUS

## 2021-07-30 MED ORDER — ONDANSETRON HCL 4 MG/2ML IJ SOLN
4.0000 mg | Freq: Four times a day (QID) | INTRAMUSCULAR | Status: DC | PRN
  Administered 2021-08-02: 14:00:00 4 mg via INTRAVENOUS
  Filled 2021-07-30: qty 2

## 2021-07-30 NOTE — Telephone Encounter (Signed)
76 yo male admitted earlier this month with [place] s/p CABG.  His post op course was c/b AFib.  He could not tolerate Amiodarone or Dronedarone.  He called the answering service since DC with low BPs.  His metoprolol was held.  His metoprolol was resumed recently due to uncontrolled HRs.  His PCP reduced the dose from 25 mg to 12.5 mg twice daily last week due to low BPs.  His wife, Brian Sullivan (DPR on file) is a NP and notes that he is short of breath with minimal activity and his HR has been as high as 188 today.  It comes down in the 90s-low 100s.  No orthopnea, leg edema, chest pain.  His weight is down.  His BP is 101/63.  But, it does drop with standing and he is symptomatic with this.   PLAN: I do not think he is tolerating his atrial fibrillation very well.  He has very rapid rates at times and his shortness of breath may be symptoms of CHF.  His BP will not allow further titration of his rate controlling medications.  I have recommended he go to the ED for management.  His wife would like to go to [facility] ED. Richardson Dopp, PA-C    08/05/2021 1:45 PM

## 2021-07-30 NOTE — ED Triage Notes (Addendum)
Pt arrived via POV for c/o dizziness, SOB, light headed, generalized weaknessx 1 wk. Pt states had triple bypass surgery a wk ago. Pt is hypertensive and in A-fib w/RVR.

## 2021-07-30 NOTE — Progress Notes (Signed)
RRT at bedside for the procedural sedation for synchronized cardioversion. rhythm is atrial fibrillation. Ventilation and oxygenation throughout procedure was uncompromising. Hemodynamic parameters were also stable throughout. Patient had 2 shocks both were unsuccessful to achieve sustainable NSR. MD Schlossman at bedside throughout.   Brian Sullivan, BS, RRT-ACCS, RCP

## 2021-07-30 NOTE — Consult Note (Signed)
Cardiology Consultation:   Patient ID: Brian Sullivan MRN: [id number]; DOB: 02-28-1946  Admit date: 07/20/2021 Date of Consult: 08/02/2021  PCP:  Brian Dress, MD   [facility] Providers Cardiologist:  Brian Salines, DO        Patient Profile:   Brian Sullivan is a 76 y.o. 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  History of Present Illness:   Mr. Filippini has newly diagnosed paroxysmal afib 07/20/2021 following CABG while in hospital. Was started on eliquis within 48 hours of diagnosis. Intolerance to both amiodarone and multaq due to GI symptoms, discharged on eliquis/lopressor. Has been in afib for 6 days per his kardio monitor; feels weak when in afib. Presented today because of increased rates to the 140s-150s. No missed eliquis doses Last ate 11 am, sips of water 3pm POCUs with normal LVEF, no PCE  Past Medical History:  Diagnosis Date   Anxiety 04/05/2020   Balance problem 04/05/2020   Benign hypertensive heart disease without heart failure 04/05/2020   Benign non-nodular prostatic hyperplasia 04/05/2020   CAD (coronary artery disease) 04/05/2020   Cerebral palsy (HCC)    Esophageal dysphagia    Gastro-esophageal reflux 04/05/2020   H. 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   IBS (irritable bowel syndrome) 04/05/2020   Irritable bladder 04/05/2020   Migraines    Plantar fasciitis, left 09/06/2015   Prostatitis 04/05/2020   Retrograde ejaculation 04/05/2020   Rupture of distal biceps tendon 04/05/2020   Skin cancer 04/05/2020   Vitamin D deficiency 04/05/2020    Past Surgical History:  Procedure Laterality Date   CHOLECYSTECTOMY     COLONOSCOPY   12/15/2009   Normal colonoscopy to terminal ileum   CORONARY ARTERY BYPASS GRAFT N/A 07/15/2021   Procedure: CORONARY ARTERY BYPASS GRAFTING (CABG) X 3, USING LEFT INTERNAL MAMMARY ARTERY, ENDOSCOPICALLY HARVESTED RIGHT GREATER SAPHENOUS VEIN;  Surgeon: Brian Byes, MD;  Location: [place];  Service: Open Heart Surgery;  Laterality: N/A;   ENDOVEIN HARVEST OF GREATER SAPHENOUS VEIN Right 07/15/2021   Procedure: ENDOVEIN HARVEST OF GREATER SAPHENOUS VEIN;  Surgeon: Brian Byes, MD;  Location: [place];  Service: Open Heart Surgery;  Laterality: Right;   ESOPHAGOGASTRODUODENOSCOPY  06/27/2012   Presbyesophagus status post esophageal dilatation. Small hiatal hernia. Mild gastritis   HIP SURGERY Left 04/2020   KNEE SURGERY Right 04/25/2019   1.Partial medial meniscectomy 2.Chondroplasty medical femoral   LAPAROSCOPIC CHOLECYSTECTOMY  02/19/2004   Done at [facility], [place]    LEFT HEART CATH AND CORONARY ANGIOGRAPHY N/A 07/13/2021   Procedure: LEFT HEART CATH AND CORONARY ANGIOGRAPHY;  Surgeon: Brian Hampshire, MD;  Location: [place] [facility];  Service: Cardiovascular;  Laterality: N/A;   PROSTATE SURGERY  06/2008   TEE WITHOUT CARDIOVERSION N/A 07/15/2021   Procedure: TRANSESOPHAGEAL ECHOCARDIOGRAM (TEE);  Surgeon: Brian Byes, MD;  Location: [place];  Service: Open Heart Surgery;  Laterality: N/A;   TENDON REPAIR Right 06/09/2011   Open right distal biceps tendon repair       Inpatient Medications: Scheduled Meds:  Continuous Infusions:  PRN Meds:   Allergies:    Allergies  Allergen Reactions   Gemfibrozil Other (See Comments)  Pantoprazole Other (See Comments)    Esomeprazole is the only PPI that works per patient   Paroxetine Hcl Itching   Statins Other (See Comments)    Leg pain   Sulfamethoxazole Other (See Comments)    Nausea, leg pain     Social History:   Social History   Socioeconomic History   Marital status: Married    Spouse name: Brian Sullivan   Number of children: 1    Years of education: Not on file   Highest education level: Some college, no degree  Occupational History   Occupation: retired  Tobacco Use   Smoking status: Former    Types: Cigarettes    Quit date: 04/05/1970    Years since quitting: 51.3   Smokeless tobacco: Never  Vaping Use   Vaping Use: Never used  Substance and Sexual Activity   Alcohol use: Yes    Alcohol/week: 0.0 standard drinks    Comment: occas.   Drug use: No   Sexual activity: Not on file  Other Topics Concern   Not on file  Social History Narrative   Patient is right-handed. He lives with his wife in a 1 story house. He drinks 1 cup of coffee day, he does not exercise. Volunteers at hospital.    Social Determinants of Health   Financial Resource Strain: Not on file  Food Insecurity: Not on file  Transportation Needs: Not on file  Physical Activity: Not on file  Stress: Not on file  Social Connections: Not on file  Intimate Partner Violence: Not on file    Family History:    Family History  Problem Relation Age of Onset   COPD Mother    Emphysema Mother    CAD Mother    Hypertension Mother    Headache Mother    Heart attack Father    CAD Father    Hypertension Father    Esophageal cancer Brother    Colon cancer Neg Hx    Pancreatic cancer Neg Hx    Stomach cancer Neg Hx      ROS:  Please see the history of present illness.   All other ROS reviewed and negative.     Physical Exam/Data:   Vitals:   08/06/2021 1645 07/23/2021 1700 07/31/2021 1730 08/05/2021 1745  BP: 123/88 104/72 97/64 124/76  Pulse: (!) 152  (!) 152 (!) 162  Resp: (!) 25 (!) 27 (!) 22 (!) 25  Temp:      TempSrc:      SpO2: 96% 97% 97% 97%  Weight:      Height:       No intake or output data in the 24 hours ending 07/22/2021 1816 Last 3 Weights 08/09/2021 07/20/2021 07/17/2021  Weight (lbs) 204 lb 9.4 oz 204 lb 9.4 oz 213 lb 13.5 oz  Weight (kg) 92.8 kg 92.8 kg 97 kg     Body mass index is 28.53 kg/m.  General:  Well  nourished, well developed, in no acute distress HEENT: normal Neck: JVP 2 cm at 30 degrees Vascular: No carotid bruits; Distal pulses 2+ bilaterally Cardiac:  IRI rate 140s Lungs:  clear to auscultation bilaterally, no wheezing, rhonchi or rales  Abd: soft, nontender, no hepatomegaly  Ext: no edema Musculoskeletal:  No deformities, BUE and BLE strength normal and equal Skin: warm and dry  Neuro:  CNs 2-12 intact, no focal abnormalities noted Psych:  Normal affect   EKG:  The EKG was personally reviewed and demonstrates:  afib rvr nonspecific t  wave changes Telemetry:  Telemetry was personally reviewed and demonstrates:  afib rvr mostly 140s-150s  Relevant CV Studies:  Laboratory Data:  High Sensitivity Troponin:   Recent Labs  Lab 07/11/21 1510 07/11/21 1703 07/11/21 2039 07/19/2021 1619  TROPONINIHS 7 9 7  34*     Chemistry Recent Labs  Lab 07/26/2021 1640  NA 135  K 4.2  CL 101  CO2 25  GLUCOSE 105*  BUN 8  CREATININE 1.08  CALCIUM 8.9  GFRNONAA >60  ANIONGAP 9    Recent Labs  Lab 07/28/2021 1640  PROT 6.9  ALBUMIN 3.1*  AST 19  ALT 16  ALKPHOS 85  BILITOT 0.8   Lipids No results for input(s): CHOL, TRIG, HDL, LABVLDL, LDLCALC, CHOLHDL in the last 168 hours.  Hematology Recent Labs  Lab 07/26/2021 1640  WBC 11.3*  RBC 4.08*  HGB 12.2*  HCT 37.7*  MCV 92.4  MCH 29.9  MCHC 32.4  RDW 13.8  PLT 343   Thyroid No results for input(s): TSH, FREET4 in the last 168 hours.  BNPNo results for input(s): BNP, PROBNP in the last 168 hours.  DDimer No results for input(s): DDIMER in the last 168 hours.   Radiology/Studies:  DG Chest Port 1 View  Result Date: 07/31/2021 CLINICAL DATA:  Shortness of breath EXAM: PORTABLE CHEST 1 VIEW COMPARISON:  07/18/2021 FINDINGS: Prior CABG. Cardiomegaly. Small left pleural effusion with left lower lobe atelectasis or infiltrate. No confluent opacity on the right. No acute bony abnormality. IMPRESSION: Left effusion with left  lower lobe atelectasis or infiltrate, both increasing since prior study. Cardiomegaly. Electronically Signed   By: Rolm Baptise M.D.   On: 07/15/2021 17:25     Assessment and Plan:  Mr Black is a 15 YOM post-CABG afib presenting with afib RVR. Given AC within 48 hours of afib and good compliance since no need to clear left atrial appendage  Afib RVR: Recommend DCCV in the ED. Can discharge on home lopressor 25 Bid and eliquis 5 BID. CHADSVASC of 4. Has follow-up with cardiology 08/09/2021. Since intolerant to amio/multaq and other antiarrhythmics either require hospital loading or are contraindicated with CAD, will follow rate-only strategy. Likely reasonable to stop eliquis in a few months if no evidence of recurrent afib since this is post-CABG afib 2. CAD s/p CABG: continue aspirin, lopressor, pravastatin   Risk Assessment/Risk Scores:            [facility] will sign off.    For questions or updates, please contact [facility] Please consult [email or web address] for contact info under    Signed, Martinique Paislei Dorval, MD  07/10/2021 6:16 PM

## 2021-07-30 NOTE — ED Provider Notes (Signed)
[facility] EMERGENCY DEPARTMENT Provider Note   CSN: [id number] Arrival date & time: 08/09/2021  2778     History  Chief Complaint  Patient presents with   Dizziness    Brian Sullivan is a 76 y.o. male.  HPI     76 year old male with history of hyperlipidemia, hypertension, coronary artery disease with prior PCI, CABGx3 performed 1/6 with Dr. Nils Pyle with continued post-operative atrial fibrillation presents with concern for fatigue, dyspnea, lightheadedness and continued atrial fibrillation.   1/6 CABG  Amiodarone was working, but having nausea, vomiitng, diarrhea and severe abdominal pain and will not take it again.  Multaq pill form, took it for 7 days then had same symptoms, nausea, abdominal pain, loose stools, would rather not take this.  Has been on lopressor, been in touch with Dr. Harriet Masson and PA 1/13, 1/16 on the phone with them Every time he stands up he is dizzy Pressures as low as 68/30 standing at home [place] home ECG, has been in atrial fibrillation since returning home On eliquis since 1/9 has not missed dose Family dr. Romie Jumper to 12.5 metoprolol on 1/18, still hypotensive Last dose lopressor 8PM  Today Pressures sitting 101/63 with pulse 154-188, 164 HR  Eliquis started on 1/9, have not missed a dose  1/13 had hypotension Has been back and forth with symptoms, low blood pressures   Stopped the lasix 1/13, couldn't keep up with oral fluids No dyspnea at rest, has it walking 30 feet Not feeling palpitations Soreness in chest from the surgery Legs swelling a little bit, left leg had hip fracture previously and that one has been chronically Significantly better then they were at discharge (had bariatric socks initially in hospital because of significant swelling)  No fevers Some mild cough, improving, using incentive spirometer and flutter valve No continuing abdominal pain, nausea or diarrhea (had it while on multaq/amio before  but not now) When standing is lightheaded and feels nausea Low appetite     Home Medications Prior to Admission medications   Medication Sig Start Date End Date Taking? Authorizing Provider  acetaminophen (TYLENOL) 650 MG CR tablet Take 650 mg by mouth every 6 (six) hours as needed (joint pain).   Yes [provider]  amitriptyline (ELAVIL) 25 MG tablet Take 25 mg by mouth in the morning and at bedtime.   Yes [provider]  apixaban (ELIQUIS) 5 MG TABS tablet Take 1 tablet (5 mg total) by mouth 2 (two) times daily. 07/21/21  Yes Antony Odea, PA-C  aspirin 81 MG EC tablet Take 1 tablet (81 mg total) by mouth daily. Swallow whole. 07/21/21  Yes Roddenberry, Myron G, PA-C  escitalopram (LEXAPRO) 5 MG tablet Take 5 mg by mouth every evening.   Yes [provider]  esomeprazole (NEXIUM) 40 MG capsule Take 40 mg by mouth 2 (two) times daily before a meal.    Yes [provider]  levothyroxine (SYNTHROID) 50 MCG tablet Take 50 mcg by mouth daily. 02/03/19  Yes [provider]  metoprolol tartrate (LOPRESSOR) 25 MG tablet Take 1 tablet (25 mg total) by mouth 2 (two) times daily. Patient taking differently: Take 12.5 mg by mouth 2 (two) times daily. 07/21/21  Yes Roddenberry, Myron G, PA-C  montelukast (SINGULAIR) 10 MG tablet Take 10 mg by mouth daily as needed (allergies).   Yes [provider]  pravastatin (PRAVACHOL) 20 MG tablet Take 20 mg by mouth every evening. 06/15/21  Yes [provider]  tadalafil (  CIALIS) 5 MG tablet Take 5 mg by mouth daily as needed for erectile dysfunction.   Yes [provider]  traMADol (ULTRAM) 50 MG tablet Take 50 mg by mouth every 6 (six) hours as needed for moderate pain.   Yes [provider]  Vitamin D, Ergocalciferol, (DRISDOL) 1.25 MG (50000 UT) CAPS capsule Take 50,000 Units by mouth every Wednesday. 02/14/19  Yes [provider]  dronedarone (MULTAQ) 400 MG tablet  Take 1 tablet (400 mg total) by mouth 2 (two) times daily with a meal. Patient not taking: Reported on 07/11/2021 07/21/21   Antony Odea, PA-C  furosemide (LASIX) 40 MG tablet Take 1 tablet (40 mg total) by mouth daily for 5 days. 07/22/21 07/27/21  Antony Odea, PA-C  potassium chloride SA (KLOR-CON M) 20 MEQ tablet Take 1 tablet (20 mEq total) by mouth daily for 5 days. Patient not taking: Reported on 08/02/2021 07/21/21 07/26/21  Antony Odea, PA-C      Allergies    Gemfibrozil, Pantoprazole, Paroxetine hcl, Statins, and Sulfamethoxazole    Review of Systems   Review of Systems See above  Physical Exam Updated Vital Signs BP 114/79    Pulse 88    Temp 98.2 F (36.8 C) (Oral)    Resp (!) 25    Ht 5\' 11"  (1.803 m)    Wt 92.8 kg    SpO2 95%    BMI 28.53 kg/m  Physical Exam Vitals and nursing note reviewed.  Constitutional:      General: He is not in acute distress.    Appearance: He is well-developed. He is not diaphoretic.  HENT:     Head: Normocephalic and atraumatic.  Eyes:     Conjunctiva/sclera: Conjunctivae normal.  Cardiovascular:     Rate and Rhythm: Tachycardia present. Rhythm irregular.     Heart sounds: Normal heart sounds. No murmur heard.   No friction rub. No gallop.     Comments: Incisions C/D/I Pulmonary:     Effort: Pulmonary effort is normal. No respiratory distress.     Breath sounds: Normal breath sounds. No wheezing or rales.  Abdominal:     General: There is no distension.     Palpations: Abdomen is soft.     Tenderness: There is no abdominal tenderness. There is no guarding.  Musculoskeletal:     Cervical back: Normal range of motion.  Skin:    General: Skin is warm and dry.  Neurological:     Mental Status: He is alert and oriented to person, place, and time.    ED Results / Procedures / Treatments   Labs (all labs ordered are listed, but only abnormal results are displayed) Labs Reviewed  CBC WITH DIFFERENTIAL/PLATELET -  Abnormal; Notable for the following components:      Result Value   WBC 11.3 (*)    RBC 4.08 (*)    Hemoglobin 12.2 (*)    HCT 37.7 (*)    Monocytes Absolute 1.1 (*)    Eosinophils Absolute 0.7 (*)    Abs Immature Granulocytes 0.26 (*)    All other components within normal limits  COMPREHENSIVE METABOLIC PANEL - Abnormal; Notable for the following components:   Glucose, Bld 105 (*)    Albumin 3.1 (*)    All other components within normal limits  BRAIN NATRIURETIC PEPTIDE - Abnormal; Notable for the following components:   B Natriuretic Peptide 271.6 (*)    All other components within normal limits  TROPONIN I (HIGH SENSITIVITY) -  Abnormal; Notable for the following components:   Troponin I (High Sensitivity) 34 (*)    All other components within normal limits  TROPONIN I (HIGH SENSITIVITY) - Abnormal; Notable for the following components:   Troponin I (High Sensitivity) 31 (*)    All other components within normal limits  RESP PANEL BY RT-PCR (FLU A&B, COVID) [id number]  BASIC METABOLIC PANEL    EKG EKG Interpretation  Date/Time:  Sunday July 31 2021 00:25:01 EST Ventricular Rate:  90 PR Interval:  168 QRS Duration: 102 QT Interval:  386 QTC Calculation: 473 R Axis:   70 Text Interpretation: Sinus rhythm Atrial premature complex Low voltage, precordial leads When compared with ECG of 08/06/2021, Sinus rhythm has replaced Atrial fibrillation with rapid ventricular response REPOLARIZATION ABNORMALITY is no longer present Confirmed by Delora Fuel ([id number]) on 07/31/2021 12:43:37 AM  Radiology DG Chest Port 1 View  Result Date: 08/06/2021 CLINICAL DATA:  Shortness of breath EXAM: PORTABLE CHEST 1 VIEW COMPARISON:  07/18/2021 FINDINGS: Prior CABG. Cardiomegaly. Small left pleural effusion with left lower lobe atelectasis or infiltrate. No confluent opacity on the right. No acute bony abnormality. IMPRESSION: Left effusion with left lower lobe atelectasis or infiltrate, both  increasing since prior study. Cardiomegaly. Electronically Signed   By: Rolm Baptise M.D.   On: 07/13/2021 17:25    Procedures .Critical Care Performed by: Gareth Morgan, MD Authorized by: Gareth Morgan, MD   Critical care provider statement:    Critical care time (minutes):  30   Critical care was time spent personally by me on the following activities:  Development of treatment plan with patient or surrogate, discussions with consultants, evaluation of patient's response to treatment, examination of patient, ordering and review of laboratory studies, ordering and review of radiographic studies, ordering and performing treatments and interventions, pulse oximetry, re-evaluation of patient's condition and review of old charts .Sedation  Date/Time: 07/31/2021 3:01 AM Performed by: Gareth Morgan, MD Authorized by: Gareth Morgan, MD   Consent:    Consent obtained:  Verbal   Consent given by:  Patient   Risks discussed:  Allergic reaction, inadequate sedation, nausea, dysrhythmia, prolonged hypoxia resulting in organ damage, prolonged sedation necessitating reversal, respiratory compromise necessitating ventilatory assistance and intubation and vomiting   Alternatives discussed:  Analgesia without sedation Universal protocol:    Procedure explained and questions answered to patient or proxy's satisfaction: yes     Relevant documents present and verified: yes     Test results available: yes     Imaging studies available: yes     Required blood products, implants, devices, and special equipment available: yes     Immediately prior to procedure, a time out was called: yes     Patient identity confirmed:  Arm band and verbally with patient Indications:    Procedure performed:  Cardioversion   Procedure necessitating sedation performed by:  Physician performing sedation Pre-sedation assessment:    Time since last food or drink:  6   ASA classification: class 3 - patient with  severe systemic disease     Mouth opening:  3 or more finger widths   Thyromental distance:  3 finger widths   Mallampati score:  III - soft palate, base of uvula visible   Neck mobility: normal     Pre-sedation assessments completed and reviewed: airway patency, cardiovascular function, hydration status, mental status, nausea/vomiting, pain level, respiratory function and temperature   Immediate pre-procedure details:    Reassessment: Patient reassessed immediately prior to procedure  Reviewed: vital signs, relevant labs/tests and NPO status     Verified: bag valve mask available, emergency equipment available, intubation equipment available, IV patency confirmed, oxygen available, reversal medications available and suction available   Procedure details (see MAR for exact dosages):    Sedation:  Etomidate   Intended level of sedation: deep and moderate (conscious sedation)   Intra-procedure monitoring:  Blood pressure monitoring, cardiac monitor, continuous pulse oximetry, continuous capnometry, frequent LOC assessments and frequent vital sign checks   Intra-procedure events: none     Intra-procedure management:  Airway repositioning   Total Provider sedation time (minutes):  20 Post-procedure details:    Patient is stable for discharge or admission: yes     Procedure completion:  Tolerated well, no immediate complications .Cardioversion  Date/Time: 07/31/2021 3:03 AM Performed by: Gareth Morgan, MD Authorized by: Gareth Morgan, MD   Consent:    Consent obtained:  Written   Consent given by:  Patient   Risks discussed:  Cutaneous burn, death, induced arrhythmia and pain   Alternatives discussed:  Alternative treatment, rate-control medication and no treatment Pre-procedure details:    Cardioversion basis:  Elective   Rhythm:  Atrial fibrillation   Electrode placement:  Anterior-posterior Attempt one:    Cardioversion mode:  Synchronous   Shock (Joules):  120   Shock  outcome:  No change in rhythm Attempt two:    Cardioversion mode:  Synchronous   Shock (Joules):  200   Shock outcome:  No change in rhythm (brief conversion to sinus rhythm for 2-3 beats then return to atrial fibrillation) Post-procedure details:    Patient tolerance of procedure:  Tolerated well, no immediate complications    Medications Ordered in ED Medications  apixaban (ELIQUIS) tablet 5 mg (5 mg Oral Given 08/06/2021 2059)  sotalol (BETAPACE) tablet 80 mg (80 mg Oral Given 07/15/2021 2237)  acetaminophen (TYLENOL) tablet 650 mg (has no administration in time range)  ondansetron (ZOFRAN) injection 4 mg (has no administration in time range)  amitriptyline (ELAVIL) tablet 25 mg (25 mg Oral Given 07/31/21 0133)  aspirin EC tablet 81 mg (has no administration in time range)  esomeprazole (NEXIUM) capsule 20 mg (20 mg Oral Given 07/31/21 0133)  levothyroxine (SYNTHROID) tablet 50 mcg (has no administration in time range)  pravastatin (PRAVACHOL) tablet 20 mg (has no administration in time range)  etomidate (AMIDATE) injection 9 mg (9 mg Intravenous Given 07/18/2021 2010)  ondansetron (ZOFRAN) injection 4 mg (4 mg Intravenous Given 07/19/2021 2005)  etomidate (AMIDATE) injection (9 mg Intravenous Given 07/29/2021 2010)    ED Course/ Medical Decision Making/ A&P                           Medical Decision Making Amount and/or Complexity of Data Reviewed Labs: ordered. Radiology: ordered.  Risk Prescription drug management. Decision regarding hospitalization.    76 year old male with history of hyperlipidemia, hypertension, coronary artery disease with prior PCI, CABGx3 performed 1/6 with Dr. Nils Pyle with continued post-operative atrial fibrillation presents with concern for fatigue, dyspnea, lightheadedness and continued atrial fibrillation.  Arrives with atrial fibrillation with RVR and a rate in the 150s, and concern for limited options for treatment, with report of significant  hypertension at home, some low blood pressures earlier on arrival to the emergency department down to the 90s, and severe GI intolerance of amiodarone.  Chest x-ray was personally reviewed and interpreted by me, appears similar to prior however mildly increased pleural effusion and  atelectasis versus infiltrate.  Have lower suspicion for infiltrate clinically given no fever, cough, feel atelectasis is more likely.  Cardiology did perform bedside ultrasound which did not show signs of tamponade.  Has been on anticoagulation since shortly after the surgery and have low suspicion for pulmonary embolus.  No sign of pneumothorax or significant pulmonary edema on x-ray.  Suspect his symptoms are due to symptomatic atrial fibrillation.  Consulted cardiology.  Recommends ED cardioversion given he was started on anticoagulation while in the hospital for witnessed beginning of atrial fibrillation and has been taking it as prescribed.  Discussed risks and benefits of cardioversion with patient and his wife.  Performed cardioversion under etomidate sedation, however he only briefly went into a sinus rhythm for a few beats prior to returning to atrial fibrillation with RVR.  Reconsulted cardiology Dr. Gaynelle Arabian who reports he will begin sotalol and admit for further care.         Final Clinical Impression(s) / ED Diagnoses Final diagnoses:  Atrial fibrillation with RVR [facility])    Rx / DC Orders ED Discharge Orders          Ordered    Amb referral to AFIB Clinic        07/25/2021 2221              Gareth Morgan, MD 07/31/21 [phone]

## 2021-07-30 NOTE — Sedation Documentation (Signed)
Pt shocked at 200 joules, unsuccessful conversion

## 2021-07-30 NOTE — H&P (Signed)
Cardiology Admission History and Physical:   Patient ID: Brian Sullivan MRN: [id number]; DOB: Nov 19, 1945   Admission date: 07/16/2021  PCP:  Nicoletta Dress, MD   [facility] Providers Cardiologist:  Berniece Salines, DO        Chief Complaint:  afib rvr  Patient Profile:   Brian Sullivan is a 76 y.o. 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  History of Present Illness:   Brian Sullivan has newly diagnosed paroxysmal afib 07/20/2021 following CABG while in hospital. Was started on eliquis within 48 hours of diagnosis. Intolerance to both amiodarone and multaq due to GI symptoms, discharged on eliquis/lopressor. Has been in afib for 6 days per his kardio monitor; feels weak when in afib. Presented today because of increased rates to the 140s-150s. No missed eliquis doses Last ate 11 am, sips of water 3pm POCUs with normal LVEF, no PCE In ED DCCV attempted x2 (120 then 200 J) with brief sinus rhythm followed by recurrent Afib RVR. Admitted to cardiology   Past Medical History:  Diagnosis Date   Anxiety 04/05/2020   Balance problem 04/05/2020   Benign hypertensive heart disease without heart failure 04/05/2020   Benign non-nodular prostatic hyperplasia 04/05/2020   CAD (coronary artery disease) 04/05/2020   Cerebral palsy (HCC)    Esophageal dysphagia    Gastro-esophageal reflux 04/05/2020   H. 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   IBS (irritable bowel syndrome) 04/05/2020   Irritable bladder 04/05/2020   Migraines    Plantar fasciitis, left 09/06/2015   Prostatitis 04/05/2020   Retrograde ejaculation 04/05/2020   Rupture of distal biceps tendon 04/05/2020   Skin  cancer 04/05/2020   Vitamin D deficiency 04/05/2020    Past Surgical History:  Procedure Laterality Date   CHOLECYSTECTOMY     COLONOSCOPY  12/15/2009   Normal colonoscopy to terminal ileum   CORONARY ARTERY BYPASS GRAFT N/A 07/15/2021   Procedure: CORONARY ARTERY BYPASS GRAFTING (CABG) X 3, USING LEFT INTERNAL MAMMARY ARTERY, ENDOSCOPICALLY HARVESTED RIGHT GREATER SAPHENOUS VEIN;  Surgeon: Dahlia Byes, MD;  Location: [place];  Service: Open Heart Surgery;  Laterality: N/A;   ENDOVEIN HARVEST OF GREATER SAPHENOUS VEIN Right 07/15/2021   Procedure: ENDOVEIN HARVEST OF GREATER SAPHENOUS VEIN;  Surgeon: Dahlia Byes, MD;  Location: [place];  Service: Open Heart Surgery;  Laterality: Right;   ESOPHAGOGASTRODUODENOSCOPY  06/27/2012   Presbyesophagus status post esophageal dilatation. Small hiatal hernia. Mild gastritis   HIP SURGERY Left 04/2020   KNEE SURGERY Right 04/25/2019   1.Partial medial meniscectomy 2.Chondroplasty medical femoral   LAPAROSCOPIC CHOLECYSTECTOMY  02/19/2004   Done at [facility], [place]    LEFT HEART CATH AND CORONARY ANGIOGRAPHY N/A 07/13/2021   Procedure: LEFT HEART CATH AND CORONARY ANGIOGRAPHY;  Surgeon: Wellington Hampshire, MD;  Location: [place] CV LAB;  Service: Cardiovascular;  Laterality: N/A;   PROSTATE SURGERY  06/2008   TEE WITHOUT CARDIOVERSION N/A 07/15/2021   Procedure: TRANSESOPHAGEAL ECHOCARDIOGRAM (TEE);  Surgeon: Dahlia Byes, MD;  Location: [place];  Service: Open Heart Surgery;  Laterality: N/A;   TENDON REPAIR Right 06/09/2011   Open right distal biceps tendon repair     Medications  Prior to Admission: Prior to Admission medications   Medication Sig Start Date End Date Taking? Authorizing Provider  amitriptyline (ELAVIL) 25 MG tablet Take 25 mg by mouth in the morning and at bedtime.    [provider]  apixaban (ELIQUIS) 5 MG TABS tablet Take 1 tablet (5 mg total) by mouth 2 (two) times daily. 07/21/21   Antony Odea, PA-C  aspirin 81  MG EC tablet Take 1 tablet (81 mg total) by mouth daily. Swallow whole. 07/21/21   Antony Odea, PA-C  dronedarone (MULTAQ) 400 MG tablet Take 1 tablet (400 mg total) by mouth 2 (two) times daily with a meal. 07/21/21   Roddenberry, Myron G, PA-C  esomeprazole (NEXIUM) 40 MG capsule Take 40 mg by mouth 2 (two) times daily before a meal.     [provider]  furosemide (LASIX) 40 MG tablet Take 1 tablet (40 mg total) by mouth daily for 5 days. 07/22/21 07/27/21  Antony Odea, PA-C  levothyroxine (SYNTHROID) 50 MCG tablet Take 50 mcg by mouth daily. 02/03/19   [provider]  metoprolol tartrate (LOPRESSOR) 25 MG tablet Take 1 tablet (25 mg total) by mouth 2 (two) times daily. 07/21/21   Antony Odea, PA-C  potassium chloride SA (KLOR-CON M) 20 MEQ tablet Take 1 tablet (20 mEq total) by mouth daily for 5 days. 07/21/21 07/26/21  Antony Odea, PA-C  pravastatin (PRAVACHOL) 20 MG tablet Take 20 mg by mouth daily. 06/15/21   [provider]  tadalafil (CIALIS) 5 MG tablet Take 5 mg by mouth daily as needed for erectile dysfunction.    [provider]  Vitamin D, Ergocalciferol, (DRISDOL) 1.25 MG (50000 UT) CAPS capsule Take 50,000 Units by mouth every 7 (seven) days. 02/14/19   [provider]     Allergies:    Allergies  Allergen Reactions   Gemfibrozil Other (See Comments)   Pantoprazole Other (See Comments)    Esomeprazole is the only PPI that works per patient   Paroxetine Hcl Itching   Statins Other (See Comments)    Leg pain   Sulfamethoxazole Other (See Comments)    Nausea, leg pain     Social History:   Social History   Socioeconomic History   Marital status: Married    Spouse name: [name]   Number of children: 1   Years of education: Not on file   Highest education level: Some college, no degree  Occupational History   Occupation: retired  Tobacco Use   Smoking status: Former    Types: Cigarettes    Quit  date: 04/05/1970    Years since quitting: 51.3   Smokeless tobacco: Never  Vaping Use   Vaping Use: Never used  Substance and Sexual Activity   Alcohol use: Yes    Alcohol/week: 0.0 standard drinks    Comment: occas.   Drug use: No   Sexual activity: Not on file  Other Topics Concern   Not on file  Social History Narrative   Patient is right-handed. He lives with his wife in a 1 story house. He drinks 1 cup of coffee day, he does not exercise. Volunteers at hospital.    Social Determinants of Health   Financial Resource Strain: Not on file  Food Insecurity: Not on file  Transportation Needs: Not on file  Physical Activity: Not on file  Stress: Not on file  Social Connections: Not on file  Intimate Partner Violence: Not on file    Family  History:   The patient's family history includes CAD in his father and mother; COPD in his mother; Emphysema in his mother; Esophageal cancer in his brother; Headache in his mother; Heart attack in his father; Hypertension in his father and mother. There is no history of Colon cancer, Pancreatic cancer, or Stomach cancer.    ROS:  Please see the history of present illness.  All other ROS reviewed and negative.     Physical Exam/Data:   Vitals:   07/25/2021 2100 07/23/2021 2115 07/29/2021 2130 07/29/2021 2150  BP: 95/83  (!) 110/52 114/85  Pulse: 94 (!) 46 74 (!) 134  Resp: 19 (!) 23 (!) 22 (!) 28  Temp:      TempSrc:      SpO2: 97% 97% 96% 97%  Weight:      Height:       No intake or output data in the 24 hours ending 07/11/2021 2224 Last 3 Weights 08/08/2021 07/20/2021 07/17/2021  Weight (lbs) 204 lb 9.4 oz 204 lb 9.4 oz 213 lb 13.5 oz  Weight (kg) 92.8 kg 92.8 kg 97 kg     Body mass index is 28.53 kg/m.  General:  Well nourished, well developed, in no acute distress HEENT: normal Neck: JVP 2 cm at 30 degrees Vascular: No carotid bruits; Distal pulses 2+ bilaterally Cardiac:  IRI rate 140s Lungs:  clear to auscultation bilaterally, no  wheezing, rhonchi or rales  Abd: soft, nontender, no hepatomegaly  Ext: no edema Musculoskeletal:  No deformities, BUE and BLE strength normal and equal Skin: warm and dry  Neuro:  CNs 2-12 intact, no focal abnormalities noted Psych:  Normal affect    EKG:  The EKG was personally reviewed and demonstrates:  afib rvr nonspecific t wave changes Telemetry:  Telemetry was personally reviewed and demonstrates:  afib rvr mostly 140s-150s  Laboratory Data:  High Sensitivity Troponin:   Recent Labs  Lab 07/11/21 1510 07/11/21 1703 07/11/21 2039 07/18/2021 1619 07/10/2021 1759  TROPONINIHS 7 9 7  34* 31*      Chemistry Recent Labs  Lab 08/04/2021 1640  NA 135  K 4.2  CL 101  CO2 25  GLUCOSE 105*  BUN 8  CREATININE 1.08  CALCIUM 8.9  GFRNONAA >60  ANIONGAP 9    Recent Labs  Lab 07/23/2021 1640  PROT 6.9  ALBUMIN 3.1*  AST 19  ALT 16  ALKPHOS 85  BILITOT 0.8   Lipids No results for input(s): CHOL, TRIG, HDL, LABVLDL, LDLCALC, CHOLHDL in the last 168 hours. Hematology Recent Labs  Lab 07/24/2021 1640  WBC 11.3*  RBC 4.08*  HGB 12.2*  HCT 37.7*  MCV 92.4  MCH 29.9  MCHC 32.4  RDW 13.8  PLT 343   Thyroid No results for input(s): TSH, FREET4 in the last 168 hours. BNP Recent Labs  Lab 07/22/2021 1640  BNP 271.6*    DDimer No results for input(s): DDIMER in the last 168 hours.   Radiology/Studies:  DG Chest Port 1 View  Result Date: 08/05/2021 CLINICAL DATA:  Shortness of breath EXAM: PORTABLE CHEST 1 VIEW COMPARISON:  07/18/2021 FINDINGS: Prior CABG. Cardiomegaly. Small left pleural effusion with left lower lobe atelectasis or infiltrate. No confluent opacity on the right. No acute bony abnormality. IMPRESSION: Left effusion with left lower lobe atelectasis or infiltrate, both increasing since prior study. Cardiomegaly. Electronically Signed   By: Rolm Baptise M.D.   On: 07/26/2021 17:25     Assessment and Plan:  Brian Sullivan is a 60  YOM post-CABG afib presenting with  afib RVR. Given AC within 48 hours of afib and good compliance since no need to clear left atrial appendage. Failed DCCV x2 in the ED, will admit for sotalol loading  Afib RVR: failed DCCV in the ED so will require AAD loading prior to retrial. Severe GI symptoms to amiodarone and multaq preclude retrialing. Flecainide and propafenone contrainidcated given CAD. Will load with sotalol 80 mg PO Bid with EKGs 2 hours after each dose and daily BMPs. Consider increasing to 120 mg PO BID if Qtc allows. Stop lopressor. If still in RVR after the 5 doses will plan to retrial DCCV 1/24. Continue eliquis. CHADSVASC 4. CAD s/p CABG: continue aspirin, pravastatin Hypothyroidism: synthroid GERD: home nexium (non-formulary, placed request), per family protonix doe snot work Neuropathy: home elavil   Risk Assessment/Risk Scores:         CHA2DS2-VASc Score =    4 REFRESH note before signing. :1} This indicates a  % annual risk of stroke. The patient's score is based upon:        Severity of Illness: The appropriate patient status for this patient is INPATIENT. Inpatient status is judged to be reasonable and necessary in order to provide the required intensity of service to ensure the patient's safety. The patient's presenting symptoms, physical exam findings, and initial radiographic and laboratory data in the context of their chronic comorbidities is felt to place them at high risk for further clinical deterioration. Furthermore, it is not anticipated that the patient will be medically stable for discharge from the hospital within 2 midnights of admission.   * I certify that at the point of admission it is my clinical judgment that the patient will require inpatient hospital care spanning beyond 2 midnights from the point of admission due to high intensity of service, high risk for further deterioration and high frequency of surveillance required.*   For questions or updates, please contact [place] Please consult [email or web address] for contact info under     Signed, [place] Dorothyann Mourer, MD  07/11/2021 10:24 PM

## 2021-07-30 NOTE — Sedation Documentation (Signed)
Pt shocked at 120 joules, unsuccessful conversion

## 2021-07-31 DIAGNOSIS — I4891 Unspecified atrial fibrillation: Secondary | ICD-10-CM | POA: Diagnosis not present

## 2021-07-31 LAB — BASIC METABOLIC PANEL
Anion gap: 7 (ref 5–15)
BUN: 9 mg/dL (ref 8–23)
CO2: 23 mmol/L (ref 22–32)
Calcium: 8.2 mg/dL — ABNORMAL LOW (ref 8.9–10.3)
Chloride: 105 mmol/L (ref 98–111)
Creatinine, Ser: 0.98 mg/dL (ref 0.61–1.24)
GFR, Estimated: >60 mL/min (ref >60)
Glucose, Bld: 95 mg/dL (ref 70–99)
Potassium: 4.1 mmol/L (ref 3.5–5.1)
Sodium: 135 mmol/L (ref 135–145)

## 2021-07-31 NOTE — Progress Notes (Addendum)
Electrophysiology Rounding Note  Patient Name: Brian Sullivan Date of Encounter: 08/01/21   Primary Cardiologist: Berniece Salines, DO Electrophysiologist: Dr. Quentin Ore   Subjective   The patient is doing well today.  At this time, the patient denies chest pain, shortness of breath, or any new concerns.  Inpatient Medications    Scheduled Meds:  amitriptyline  25 mg Oral BID   apixaban  5 mg Oral BID   aspirin EC  81 mg Oral Daily   esomeprazole  20 mg Oral BID   levothyroxine  50 mcg Oral Q0600   pravastatin  20 mg Oral q1800   sotalol  80 mg Oral Q12H   Continuous Infusions:  PRN Meds: acetaminophen, ondansetron (ZOFRAN) IV   Vital Signs    Vitals:   07/31/21 1100 07/31/21 1200 07/31/21 1307 07/31/21 2042  BP: 117/79 121/76 127/87 133/84  Pulse: 79 90 87 85  Resp: 18 19  18   Temp:    98.1 F (36.7 C)  TempSrc:  Oral  Oral  SpO2: 95% 95% 96% 99%  Weight:      Height:        Intake/Output Summary (Last 24 hours) at 07/31/2021 2150 Last data filed at 07/31/2021 1845 Gross per 24 hour  Intake 240 ml  Output 550 ml  Net -310 ml   Filed Weights   07/14/2021 1611  Weight: 92.8 kg    Physical Exam    GEN- The patient is well appearing, alert and oriented x 3 today.   Head- normocephalic, atraumatic Eyes-  Sclera clear, conjunctiva pink Ears- hearing intact Oropharynx- clear Neck- supple Lungs- Clear to ausculation bilaterally, normal work of breathing Heart- Regular rate and rhythm, no murmurs, rubs or gallops GI- soft, NT, ND, + BS Extremities- no clubbing or cyanosis. No edema Skin- no rash or lesion Psych- euthymic mood, full affect Neuro- strength and sensation are intact  Labs    CBC Recent Labs    08/01/2021 1640  WBC 11.3*  NEUTROABS 7.4  HGB 12.2*  HCT 37.7*  MCV 92.4  PLT 568   Basic Metabolic Panel Recent Labs    08/04/2021 1640 07/31/21 0548  NA 135 135  K 4.2 4.1  CL 101 105  CO2 25 23  GLUCOSE 105* 95  BUN 8 9  CREATININE 1.08  0.98  CALCIUM 8.9 8.2*   Liver Function Tests Recent Labs    07/13/2021 1640  AST 19  ALT 16  ALKPHOS 85  BILITOT 0.8  PROT 6.9  ALBUMIN 3.1*   No results for input(s): LIPASE, AMYLASE in the last 72 hours. Cardiac Enzymes No results for input(s): CKTOTAL, CKMB, CKMBINDEX, TROPONINI in the last 72 hours.   Telemetry    NSR 60-70s (personally reviewed)  Radiology    DG Chest Port 1 View  Result Date: 07/15/2021 CLINICAL DATA:  Shortness of breath EXAM: PORTABLE CHEST 1 VIEW COMPARISON:  07/18/2021 FINDINGS: Prior CABG. Cardiomegaly. Small left pleural effusion with left lower lobe atelectasis or infiltrate. No confluent opacity on the right. No acute bony abnormality. IMPRESSION: Left effusion with left lower lobe atelectasis or infiltrate, both increasing since prior study. Cardiomegaly. Electronically Signed   By: Rolm Baptise M.D.   On: 07/11/2021 17:25    Patient Profile     Brian Sullivan is a 76yo man with CAD s/p CABG in 07/2021 c/b post operative atrial fibrillation who was admitted 07/21/2021 with symptomatic atrial fibrillation with RVR. He has previous intolerances documented with amiodarone and dronedarone.  Assessment & Plan     Atrial fibrillation, post operative Sotalol was started 07/17/2021 PM. EKG last night shows stable QTc ~480  Continue sotalol 80mg  PO BID. To receive dose #4 this am. Potentially home as early as tomorrow if otherwise stable.  Repeat echo ordered. If EF down further, may need to consider alternative agent.   2. CAD s/p CABG Cont asa and statin   3. Hypothyroidism  4. Depression Asking to be put back on escitalopram.  Seems to be one of the safer agents re: QT. Will review with MD.   ADDENDUM EF likely in 40-45% range.   Plan to STOP sotalol. Start [employer] pending course.   For questions or updates, please contact [place] Please consult [email or web address] for contact info under  Cardiology/STEMI.  Signed, Shirley Friar, PA-C  07/31/2021, 9:50 PM

## 2021-07-31 NOTE — Progress Notes (Signed)
Mobility Specialist: Progress Note   07/31/21 1531  Mobility  Activity Refused mobility   Pt refused mobility with c/o feeling worn out today. Will f/u as able.  [facility] Allesha Aronoff Mobility Specialist Mobility Specialist [address] [place]: [phone] Mobility Specialist [address] [place]: [phone]

## 2021-07-31 NOTE — Progress Notes (Signed)
Progress Note  Patient Name: Brian Sullivan Date of Encounter: 07/31/2021  [facility] Cardiologist: Godfrey Pick Tobb, DO  Subjective   NAEO. In sinus rhythm this morning. Sotalol started by admitting physician 1/21 PM.  Inpatient Medications    Scheduled Meds:  amitriptyline  25 mg Oral BID   apixaban  5 mg Oral BID   aspirin EC  81 mg Oral Daily   esomeprazole  20 mg Oral BID   levothyroxine  50 mcg Oral Q0600   pravastatin  20 mg Oral q1800   sotalol  80 mg Oral Q12H   Continuous Infusions:  PRN Meds: acetaminophen, ondansetron (ZOFRAN) IV   Vital Signs    Vitals:   07/31/21 0300 07/31/21 0320 07/31/21 0500 07/31/21 0600  BP: 118/74 117/74 124/87 105/76  Pulse: 80 80 85 81  Resp: (!) 23 (!) 25 (!) 21 (!) 23  Temp:      TempSrc:      SpO2: 91% 95% 94% 95%  Weight:      Height:        Intake/Output Summary (Last 24 hours) at 07/31/2021 0737 Last data filed at 07/31/2021 0630 Gross per 24 hour  Intake --  Output 100 ml  Net -100 ml   Last 3 Weights 07/25/2021 07/20/2021 07/17/2021  Weight (lbs) 204 lb 9.4 oz 204 lb 9.4 oz 213 lb 13.5 oz  Weight (kg) 92.8 kg 92.8 kg 97 kg      Telemetry    Personally Reviewed  ECG    Personally Reviewed  Physical Exam   GEN: No acute distress.   Neck: No JVD Cardiac: RRR, no murmurs, rubs, or gallops.  Respiratory: Clear to auscultation bilaterally. GI: Soft, nontender, non-distended  MS: No edema; No deformity. Neuro:  Nonfocal  Psych: Normal affect   Labs    High Sensitivity Troponin:   Recent Labs  Lab 07/11/21 1510 07/11/21 1703 07/11/21 2039 08/07/2021 1619 07/27/2021 1759  TROPONINIHS 7 9 7  34* 31*     Chemistry Recent Labs  Lab 07/11/2021 1640 07/31/21 0548  NA 135 135  K 4.2 4.1  CL 101 105  CO2 25 23  GLUCOSE 105* 95  BUN 8 9  CREATININE 1.08 0.98  CALCIUM 8.9 8.2*  PROT 6.9  --   ALBUMIN 3.1*  --   AST 19  --   ALT 16  --   ALKPHOS 85  --   BILITOT 0.8  --   GFRNONAA >60 >60  ANIONGAP  9 7    Lipids No results for input(s): CHOL, TRIG, HDL, LABVLDL, LDLCALC, CHOLHDL in the last 168 hours.  Hematology Recent Labs  Lab 07/13/2021 1640  WBC 11.3*  RBC 4.08*  HGB 12.2*  HCT 37.7*  MCV 92.4  MCH 29.9  MCHC 32.4  RDW 13.8  PLT 343   Thyroid No results for input(s): TSH, FREET4 in the last 168 hours.  BNP Recent Labs  Lab 08/02/2021 1640  BNP 271.6*    DDimer No results for input(s): DDIMER in the last 168 hours.   Radiology    DG Chest Port 1 View  Result Date: 07/18/2021 CLINICAL DATA:  Shortness of breath EXAM: PORTABLE CHEST 1 VIEW COMPARISON:  07/18/2021 FINDINGS: Prior CABG. Cardiomegaly. Small left pleural effusion with left lower lobe atelectasis or infiltrate. No confluent opacity on the right. No acute bony abnormality. IMPRESSION: Left effusion with left lower lobe atelectasis or infiltrate, both increasing since prior study. Cardiomegaly. Electronically Signed   By: Rolm Baptise  M.D.   On: 07/12/2021 17:25    Cardiac Studies     Assessment & Plan   Brian Sullivan is a 76yo man with CAD s/p CABG in 07/2021 c/b post operative atrial fibrillation who was admitted 78/11/2021 with symptomatic atrial fibrillation with RVR. He has previous intolerances documented with amiodarone and dronedarone.   #Atrial fibrillation, post operative Sotalol was started 78/24/2023 PM. In sinus rhythm this morning.  QTc 433ms this morning Continue sotalol 80mg  PO BID. Repeat echo ordered  #CAD s/p CABG Cont asa and statin  #Hypothyroidism      For questions or updates, please contact [place] Please consult [email or web address] for contact info under        Signed, Vickie Epley, MD  07/31/2021, 7:37 AM

## 2021-07-31 NOTE — Progress Notes (Signed)
°  Transition of Care [facility]) Screening Note   Patient Details  Name: Brian Sullivan Date of Birth: 1946/06/30   Transition of Care [facility]) CM/SW Contact:    Bary Castilla, LCSW Phone Number:[phone] 07/31/2021, 2:09 PM    Transition of Care Department [facility]) has reviewed patient and no TOC needs have been identified at this time. We will continue to monitor patient advancement through interdisciplinary progression rounds. If new patient transition needs arise, please place a TOC consult.

## 2021-08-01 ENCOUNTER — Inpatient Hospital Stay (HOSPITAL_COMMUNITY): Payer: Medicare Other

## 2021-08-01 DIAGNOSIS — I4891 Unspecified atrial fibrillation: Secondary | ICD-10-CM | POA: Diagnosis not present

## 2021-08-01 DIAGNOSIS — I255 Ischemic cardiomyopathy: Secondary | ICD-10-CM

## 2021-08-01 LAB — ECHOCARDIOGRAM COMPLETE
AR max vel: 1.5 cm2
AV Area VTI: 1.59 cm2
AV Area mean vel: 1.37 cm2
AV Mean grad: 5.8 mmHg
AV Peak grad: 10 mmHg
Ao pk vel: 1.58 m/s
Area-P 1/2: 2.92 cm2
Calc EF: 37 %
Height: 71 in
Single Plane A2C EF: 41.5 %
Single Plane A4C EF: 36.8 %
Weight: 3120 oz

## 2021-08-01 LAB — BASIC METABOLIC PANEL
Anion gap: 8 (ref 5–15)
BUN: 10 mg/dL (ref 8–23)
CO2: 21 mmol/L — ABNORMAL LOW (ref 22–32)
Calcium: 8.4 mg/dL — ABNORMAL LOW (ref 8.9–10.3)
Chloride: 107 mmol/L (ref 98–111)
Creatinine, Ser: 0.87 mg/dL (ref 0.61–1.24)
GFR, Estimated: >60 mL/min (ref >60)
Glucose, Bld: 98 mg/dL (ref 70–99)
Potassium: 4.3 mmol/L (ref 3.5–5.1)
Sodium: 136 mmol/L (ref 135–145)

## 2021-08-01 LAB — MAGNESIUM: Magnesium: 1.9 mg/dL (ref 1.7–2.4)

## 2021-08-01 MED ORDER — SERTRALINE HCL 50 MG PO TABS
25.0000 mg | ORAL_TABLET | Freq: Every day | ORAL | Status: DC
  Administered 2021-08-01 – 2021-08-02 (×2): 25 mg via ORAL
  Filled 2021-08-01 (×2): qty 1

## 2021-08-01 MED ORDER — MELATONIN 3 MG PO TABS
3.0000 mg | ORAL_TABLET | Freq: Every day | ORAL | Status: DC
  Administered 2021-08-01 – 2021-08-02 (×2): 3 mg via ORAL
  Filled 2021-08-01 (×2): qty 1

## 2021-08-01 MED ORDER — ADULT MULTIVITAMIN W/MINERALS CH
1.0000 | ORAL_TABLET | Freq: Every day | ORAL | Status: DC
  Administered 2021-08-01 – 2021-08-02 (×2): 1 via ORAL
  Filled 2021-08-01 (×2): qty 1

## 2021-08-01 MED ORDER — BOOST / RESOURCE BREEZE PO LIQD CUSTOM
1.0000 | Freq: Three times a day (TID) | ORAL | Status: DC
  Administered 2021-08-02: 11:00:00 1 via ORAL

## 2021-08-01 MED ORDER — TRAMADOL HCL 50 MG PO TABS
50.0000 mg | ORAL_TABLET | Freq: Four times a day (QID) | ORAL | Status: DC | PRN
Start: 2021-08-01 — End: 2021-08-03
  Administered 2021-08-01 – 2021-08-02 (×2): 50 mg via ORAL
  Filled 2021-08-01 (×2): qty 1

## 2021-08-01 MED ORDER — MAGNESIUM SULFATE 2 GM/50ML IV SOLN
2.0000 g | Freq: Once | INTRAVENOUS | Status: AC
  Administered 2021-08-01: 2 g via INTRAVENOUS
  Filled 2021-08-01: qty 50

## 2021-08-01 NOTE — Telephone Encounter (Signed)
Yes, patient was made aware via mychart messages to stop multaq and start lopressor. Patient currently admitted for last 2 days. Will route to primary nurse as Juluis Rainier to follow.   Thank you!

## 2021-08-01 NOTE — Progress Notes (Signed)
Initial Nutrition Assessment  DOCUMENTATION CODES:   Not applicable  INTERVENTION:  Boost Breeze, TID between meals Vanilla pudding TID with meals per pt request, RD added via [employer] diet software MVI with minerals    NUTRITION DIAGNOSIS:   Inadequate oral intake related to poor appetite as evidenced by per patient/family report.   GOAL:   Patient will meet greater than or equal to 90% of their needs   MONITOR:   PO intake, Supplement acceptance, Weight trends    REASON FOR ASSESSMENT:   Malnutrition Screening Tool    ASSESSMENT:    Pt is a 76 y.o. male with PMH significant with CAD s/p CABG 07/2021 c/b post-op A-fib who is being seen for the evaluation of A-fib.   Pt sitting in chair during visit; alert and oriented. Pt's wife present. Per pt, pt has had a poor appetite since being in the hospital. Pt reports that he did not eat breakfast today and for lunch he had rice with no salt, pinto beans, and a boiled egg. Per pt, he only took a few bits of the pinto beans and rice and did not eat the boiled egg. Pt's wife reports that since pt's procedure on 1/6, pt only eats partial or barely anything of his meal and that his appetite has been poor. PTA and prior to procedure on 1/6, pt reports that he had a good appetite.   24-hour recall PTA and prior to 1/6:  Breakfast: muffin, coffee, cheerios Lunch: sweet potato, country style steak (typically eats out a [employer]gallery with his wife) Dinner: grilled chicken sandwich from chick-fil a or a casserole (cooked by his wife)  Pt also reports that he occasionally uses a cane to get around the house or walks on own. Per pt, he always uses a cane when they leave the house.   Per weight encounters, pt has experienced a 4.5% weight loss in the past 12 days. Suspect weight on 1/11 was falsely elevated secondary to fluid post procedure. Per pt, pt's UBW is 188#.   Medications reviewed.   Labs reviewed and include: CBG 123,   NUTRITION  - FOCUSED PHYSICAL EXAM:  Flowsheet Row Most Recent Value  Orbital Region No depletion  Upper Arm Region Mild depletion  Thoracic and Lumbar Region No depletion  Buccal Region Mild depletion  Temple Region No depletion  Clavicle Bone Region No depletion  Clavicle and Acromion Bone Region No depletion  Scapular Bone Region No depletion  Dorsal Hand No depletion  Patellar Region Mild depletion  Anterior Thigh Region Mild depletion  Posterior Calf Region Mild depletion  [Moderate depletion left calf d/t cerebral palsy]  Edema (RD Assessment) None  Hair Reviewed  Eyes Reviewed  Mouth Reviewed  Skin Reviewed  Nails Reviewed       Diet Order:   Diet Order             Diet Heart Room service appropriate? Yes; Fluid consistency: Thin  Diet effective now                   EDUCATION NEEDS:   Education needs have been addressed  Skin:  Skin Assessment: Reviewed RN Assessment  Last BM:  1/22  Height:   Ht Readings from Last 1 Encounters:  07/10/2021 5\' 11"  (1.803 m)    Weight:   Wt Readings from Last 1 Encounters:  08/01/21 88.5 kg    Ideal Body Weight:  78 kg  BMI:  Body mass index is 27.2 kg/m.  Estimated Nutritional Needs:   Kcal:  2000 - 2200  Protein:  105 - 120 grams  Fluid:  > 2 L    Maryruth Hancock, Dietetic Intern 08/01/2021 4:24 PM

## 2021-08-01 NOTE — Progress Notes (Incomplete)
{  CHL IP ALL NUTRITION NOTES:[id number]} 

## 2021-08-01 NOTE — Progress Notes (Signed)
OT Cancellation Note  Patient Details Name: Brian Sullivan MRN: [id number] DOB: [birth year]   Cancelled Treatment:    Reason Eval/Treat Not Completed: Patient at procedure or test/ unavailable (Will return as schedule allows. Thank you.)  [place], OTR/L Acute Rehab Pager: [phone] Office: [phone] 08/01/2021, 10:33 AM

## 2021-08-01 NOTE — Progress Notes (Signed)
PT Cancellation Note  Patient Details Name: Vinny Taranto MRN: [id number] DOB: [birth year]   Cancelled Treatment:    Reason Eval/Treat Not Completed: Patient at procedure or test/unavailable  Abran Richard, PT Acute Rehab Services Pager [phone] [facility] [place] 08/01/2021, 11:23 AM

## 2021-08-01 NOTE — Evaluation (Signed)
Occupational Therapy Evaluation Patient Details Name: Brian Sullivan MRN: [id number] DOB: Aug 29, 1945 Today's Date: 08/01/2021   History of Present Illness 76 yo male presenting to ED on 1/21 for a-fib. Pt with recent admission for s/p CABG on 1/6. 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   Clinical Impression   PTA, pt was living with his wife and was performing BADLs and functional mobility with RW; recent stay at [facility] for rehab. Pt currently requiring Min A for ADLs and functional mobility. Pt presenting with decreased activity tolerance. Pt would benefit from further acute OT to facilitate safe dc. Recommend dc to home with HHOT for further OT to optimize safety, independence with ADLs, and return to PLOF.      Recommendations for follow up therapy are one component of a multi-disciplinary discharge planning process, led by the attending physician.  Recommendations may be updated based on patient status, additional functional criteria and insurance authorization.   Follow Up Recommendations  Home health OT    Assistance Recommended at Discharge Frequent or constant Supervision/Assistance  Patient can return home with the following A little help with walking and/or transfers;A little help with bathing/dressing/bathroom;Assistance with cooking/housework    Functional Status Assessment  Patient has had a recent decline in their functional status and demonstrates the ability to make significant improvements in function in a reasonable and predictable amount of time.  Equipment Recommendations  None recommended by OT    Recommendations for Other Services PT consult     Precautions / Restrictions Precautions Precautions: Fall;Sternal Precaution Booklet Issued: No Precaution Comments: s/p CABG on 1/6 at last admission Restrictions Weight Bearing Restrictions: No      Mobility Bed Mobility               General bed mobility comments:  Sitting in recliner upon arrival    Transfers Overall transfer level: Needs assistance Equipment used: Rolling walker (2 wheels) Transfers: Sit to/from Stand Sit to Stand: Min assist           General transfer comment: Min A for power up into standing      Balance Overall balance assessment: Needs assistance Sitting-balance support: No upper extremity supported, Feet supported Sitting balance-Leahy Scale: Fair     Standing balance support: Bilateral upper extremity supported, During functional activity, No upper extremity supported Standing balance-Leahy Scale: Fair Standing balance comment: can static stand without UE support                           ADL either performed or assessed with clinical judgement   ADL Overall ADL's : Needs assistance/impaired Eating/Feeding: Set up;Sitting   Grooming: Set up;Sitting   Upper Body Bathing: Minimal assistance;Sitting   Lower Body Bathing: Minimal assistance;Sit to/from stand   Upper Body Dressing : Minimal assistance;Sitting   Lower Body Dressing: Minimal assistance;Sit to/from stand Lower Body Dressing Details (indicate cue type and reason): Carlyle Lipa to perform figure four. However, should not be performing external rotation with L hip as he is 8 month s/p posterior hip replacement. Education pt and wife on donning LLE first Toilet Transfer: Minimal assistance;Rolling walker (2 wheels) (simulated in room)           Functional mobility during ADLs: Minimal assistance;Rolling walker (2 wheels) General ADL Comments: Pt presenting with decreased actiity toelrance. At end of session, pt becoming very overwhelmed by recent change in medicine. reporting he is nauseous and unable to participate  as he is overwhelmed. returned to recliner and used active listening to assist pt is calming.     Vision Baseline Vision/History: 1 Wears glasses Patient Visual Report: No change from baseline       Perception     Praxis       Pertinent Vitals/Pain Pain Assessment Pain Assessment: No/denies pain     Hand Dominance     Extremity/Trunk Assessment Upper Extremity Assessment Upper Extremity Assessment: Generalized weakness   Lower Extremity Assessment Lower Extremity Assessment: Generalized weakness   Cervical / Trunk Assessment Cervical / Trunk Assessment: Normal   Communication Communication Communication: No difficulties   Cognition Arousal/Alertness: Awake/alert Behavior During Therapy: WFL for tasks assessed/performed Overall Cognitive Status: Impaired/Different from baseline Area of Impairment: Safety/judgement, Awareness, Problem solving, Memory, Attention                   Current Attention Level: Selective Memory: Decreased short-term memory   Safety/Judgement: Decreased awareness of safety Awareness: Emergent Problem Solving: Slow processing, Requires verbal cues       General Comments  Wife present throughout. VSS on RA    Exercises     Shoulder Instructions      Home Living Family/patient expects to be discharged to:: Private residence Living Arrangements: Spouse/significant other Available Help at Discharge: Family;Available PRN/intermittently Type of Home: House Home Access: Stairs to enter [employer] of Steps: 1   Home Layout: One level     Bathroom Shower/Tub: Occupational psychologist: Handicapped height     Home Equipment: Shower seat;Grab bars - tub/shower;Rolling Environmental consultant (2 wheels)   Additional Comments: Wife works as Urology NP; likely to have daughter stay with pt when wife has to return to work      Prior Functioning/Environment Prior Level of Function : Independent/Modified Independent;Driving;History of Falls (last six months)             Mobility Comments: Using walker for mobility after recent stay at SNF s/p CABG ADLs Comments: Reports he was performing BADLs        OT Problem List: Decreased strength;Decreased  range of motion;Decreased activity tolerance;Impaired balance (sitting and/or standing);Decreased knowledge of use of DME or AE;Decreased knowledge of precautions      OT Treatment/Interventions: Self-care/ADL training;Therapeutic exercise;Energy conservation;DME and/or AE instruction;Therapeutic activities;Patient/family education    OT Goals(Current goals can be found in the care plan section) Acute Rehab OT Goals Patient Stated Goal: Go home OT Goal Formulation: With patient/family Time For Goal Achievement: 08/15/21 Potential to Achieve Goals: Good  OT Frequency: Min 2X/week    Co-evaluation              AM-PAC OT "6 Clicks" Daily Activity     Outcome Measure Help from another person eating meals?: A Little Help from another person taking care of personal grooming?: A Little Help from another person toileting, which includes using toliet, bedpan, or urinal?: A Little Help from another person bathing (including washing, rinsing, drying)?: A Little Help from another person to put on and taking off regular upper body clothing?: A Little Help from another person to put on and taking off regular lower body clothing?: A Little 6 Click Score: 18   End of Session Equipment Utilized During Treatment: Rolling walker (2 wheels);Gait belt Nurse Communication: Mobility status  Activity Tolerance: Patient tolerated treatment well Patient left: in chair;with call bell/phone within reach;with family/visitor present  OT Visit Diagnosis: Unsteadiness on feet (R26.81);Other abnormalities of gait and mobility (R26.89);Muscle weakness (generalized) (M62.81)  Time: 1203-1221 OT Time Calculation (min): 18 min Charges:  OT General Charges $OT Visit: 1 Visit OT Evaluation $OT Eval Moderate Complexity: [place], OTR/L Acute Rehab Pager: [phone] Office: [place] 08/01/2021, 1:15 PM

## 2021-08-01 NOTE — Progress Notes (Deleted)
Cardiology Office Note:    Date:  08/01/2021   ID:  Brian Sullivan, DOB 02-03-1946, MRN [id number]  PCP:  Nicoletta Dress, MD  Cardiologist:  Berniece Salines, DO  Electrophysiologist:  None   Referring MD: Nicoletta Dress, MD   Chief Complaint: ***  History of Present Illness:    Brian Sullivan is a 76 y.o. male with a history of ***  Past Medical History:  Diagnosis Date   Anxiety 04/05/2020   Balance problem 04/05/2020   Benign hypertensive heart disease without heart failure 04/05/2020   Benign non-nodular prostatic hyperplasia 04/05/2020   CAD (coronary artery disease) 04/05/2020   Cerebral palsy ([place])    Esophageal dysphagia    Gastro-esophageal reflux 04/05/2020   H. 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   IBS (irritable bowel syndrome) 04/05/2020   Irritable bladder 04/05/2020   Migraines    Plantar fasciitis, left 09/06/2015   Prostatitis 04/05/2020   Retrograde ejaculation 04/05/2020   Rupture of distal biceps tendon 04/05/2020   Skin cancer 04/05/2020   Vitamin D deficiency 04/05/2020    Past Surgical History:  Procedure Laterality Date   CHOLECYSTECTOMY     COLONOSCOPY  12/15/2009   Normal colonoscopy to terminal ileum   CORONARY ARTERY BYPASS GRAFT N/A 07/15/2021   Procedure: CORONARY ARTERY BYPASS GRAFTING (CABG) X 3, USING LEFT INTERNAL MAMMARY ARTERY, ENDOSCOPICALLY HARVESTED RIGHT GREATER SAPHENOUS VEIN;  Surgeon: Dahlia Byes, MD;  Location: [place];  Service: Open Heart Surgery;  Laterality: N/A;   ENDOVEIN HARVEST OF GREATER SAPHENOUS VEIN Right 07/15/2021   Procedure: ENDOVEIN HARVEST OF GREATER SAPHENOUS VEIN;  Surgeon: Dahlia Byes, MD;  Location: [place];  Service: Open Heart Surgery;  Laterality: Right;   ESOPHAGOGASTRODUODENOSCOPY  06/27/2012   Presbyesophagus status  post esophageal dilatation. Small hiatal hernia. Mild gastritis   HIP SURGERY Left 04/2020   KNEE SURGERY Right 04/25/2019   1.Partial medial meniscectomy 2.Chondroplasty medical femoral   LAPAROSCOPIC CHOLECYSTECTOMY  02/19/2004   Done at [facility] [place]    LEFT HEART CATH AND CORONARY ANGIOGRAPHY N/A 07/13/2021   Procedure: LEFT HEART CATH AND CORONARY ANGIOGRAPHY;  Surgeon: Wellington Hampshire, MD;  Location: [place] [facility];  Service: Cardiovascular;  Laterality: N/A;   PROSTATE SURGERY  06/2008   TEE WITHOUT CARDIOVERSION N/A 07/15/2021   Procedure: TRANSESOPHAGEAL ECHOCARDIOGRAM (TEE);  Surgeon: Dahlia Byes, MD;  Location: [place];  Service: Open Heart Surgery;  Laterality: N/A;   TENDON REPAIR Right 06/09/2011   Open right distal biceps tendon repair    Current Medications: No outpatient medications have been marked as taking for the 08/09/21 encounter (Appointment) with [name], PA-C.     Allergies:   Gemfibrozil, Pantoprazole, Paroxetine hcl, Statins, and Sulfamethoxazole   Social History   Socioeconomic History   Marital status: Married    Spouse name: [name]   Number of children: 1   Years of education: Not on file   Highest education level: Some college, no degree  Occupational History   Occupation: retired  Tobacco Use   Smoking status: Former    Types: Cigarettes    Quit date: 04/05/1970    Years since quitting: 51.3   Smokeless tobacco: Never  Vaping Use   Vaping Use: Never used  Substance and Sexual Activity   Alcohol  use: Yes    Alcohol/week: 0.0 standard drinks    Comment: occas.   Drug use: No   Sexual activity: Not on file  Other Topics Concern   Not on file  Social History Narrative   Patient is right-handed. He lives with his wife in a 1 story house. He drinks 1 cup of coffee day, he does not exercise. Volunteers at hospital.    Social Determinants of Health   Financial Resource Strain: Not on file  Food Insecurity: Not on file   Transportation Needs: Not on file  Physical Activity: Not on file  Stress: Not on file  Social Connections: Not on file     Family History: The patient's ***family history includes CAD in his father and mother; COPD in his mother; Emphysema in his mother; Esophageal cancer in his brother; Headache in his mother; Heart attack in his father; Hypertension in his father and mother. There is no history of Colon cancer, Pancreatic cancer, or Stomach cancer.  ROS:   Please see the history of present illness.    *** All other systems reviewed and are negative.  EKGs/Labs/Other Studies Reviewed:    The following studies were reviewed today: ***  EKG:  EKG *** ordered today. EKG personally reviewed and demonstrates ***.  Recent Labs: 07/14/2021: TSH 1.682 08/05/2021: ALT 16; B Natriuretic Peptide 271.6; Hemoglobin 12.2; Platelets 343 08/01/2021: BUN 10; Creatinine, Ser 0.87; Magnesium 1.9; Potassium 4.3; Sodium 136  Recent Lipid Panel    Component Value Date/Time   CHOL 191 07/12/2021 0615   TRIG 140 07/12/2021 0615   HDL 45 07/12/2021 0615   CHOLHDL 4.2 07/12/2021 0615   VLDL 28 07/12/2021 0615   LDLCALC 118 (H) 07/12/2021 0615    Physical Exam:    Vital Signs: There were no vitals taken for this visit.    Wt Readings from Last 3 Encounters:  08/01/21 195 lb (88.5 kg)  07/20/21 204 lb 9.4 oz (92.8 kg)  01/20/21 197 lb (89.4 kg)     General: 76 y.o. male in no acute distress. HEENT: Normocephalic and atraumatic. Sclera clear. EOMs intact. Neck: Supple. No carotid bruits. No JVD. Heart: *** RRR. Distinct S1 and S2. No murmurs, gallops, or rubs. Radial and distal pedal pulses 2+ and equal bilaterally. Lungs: No increased work of breathing. Clear to ausculation bilaterally. No wheezes, rhonchi, or rales.  Abdomen: Soft, non-distended, and non-tender to palpation. Bowel sounds present in all 4 quadrants.  MSK: Normal strength and tone for age. *** Extremities: No lower extremity  edema.    Skin: Warm and dry. Neuro: Alert and oriented x3. No focal deficits. Psych: Normal affect. Responds appropriately.   Assessment:    No diagnosis found.  Plan:     Disposition: Follow up in ***   Medication Adjustments/Labs and Tests Ordered: Current medicines are reviewed at length with the patient today.  Concerns regarding medicines are outlined above.  No orders of the defined types were placed in this encounter.  No orders of the defined types were placed in this encounter.   There are no Patient Instructions on file for this visit.   Signed, [name], PA-C  08/01/2021 4:06 PM    [facility]

## 2021-08-01 NOTE — Evaluation (Signed)
Physical Therapy Evaluation Patient Details Name: Brian Sullivan MRN: [id number] DOB: [birth year] Today's Date: 08/01/2021  History of Present Illness  76 yo male presenting to ED on 1/21 for a-fib. Pt with recent admission for s/p CABG on 1/6. 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  Clinical Impression  Pt admitted with above diagnosis. At baseline - pt was independent without AD.  Since recent CABG - pt using RW and getting HHPT.  Today , pt was able to ambulate 200' with RW and min cues.  Min A for transfers.  Pt will benefit from PT to progress but is also motivated to ambulate with family (wife is NP, aware of precautions and safety, able to assist).  Pt was receiving HHPT - recommend continuing. Pt currently with functional limitations due to the deficits listed below (see PT Problem List). Pt will benefit from skilled PT to increase their independence and safety with mobility to allow discharge to the venue listed below.          Recommendations for follow up therapy are one component of a multi-disciplinary discharge planning process, led by the attending physician.  Recommendations may be updated based on patient status, additional functional criteria and insurance authorization.  Follow Up Recommendations Home health PT    Assistance Recommended at Discharge Intermittent Supervision/Assistance  Patient can return home with the following  A little help with bathing/dressing/bathroom;Assistance with cooking/housework;Assist for transportation;Help with stairs or ramp for entrance;A little help with walking and/or transfers    Equipment Recommendations None recommended by PT  Recommendations for Other Services       Functional Status Assessment Patient has had a recent decline in their functional status and demonstrates the ability to make significant improvements in function in a reasonable and predictable amount of time.     Precautions /  Restrictions Precautions Precautions: Fall;Sternal Precaution Booklet Issued: No (have at home - recalled "tube" handout) Precaution Comments: s/p CABG on 1/6 at last admission      Mobility  Bed Mobility               General bed mobility comments: Sitting in recliner upon arrival    Transfers Overall transfer level: Needs assistance Equipment used: Rolling walker (2 wheels) Transfers: Sit to/from Stand Sit to Stand: Min assist           General transfer comment: Min A for power up into standing - cues for hand placement for sternal prec    Ambulation/Gait Ambulation/Gait assistance: Supervision, Min guard Gait Distance (Feet): 200 Feet Assistive device: Rolling walker (2 wheels) Gait Pattern/deviations: Step-through pattern Gait velocity: Decreased     General Gait Details: Min guard progressed to close supervision; Min cues for safety/RW use.  Noted circumduction, genu valgus, and decreased ankle ROM with gait on L (this is his baseline pattern- hx of CP)  Stairs            Wheelchair Mobility    Modified Rankin (Stroke Patients Only)       Balance Overall balance assessment: Needs assistance Sitting-balance support: No upper extremity supported, Feet supported Sitting balance-Leahy Scale: Good     Standing balance support: Bilateral upper extremity supported, During functional activity, No upper extremity supported Standing balance-Leahy Scale: Fair Standing balance comment: can static stand without UE support                             Pertinent  Vitals/Pain Pain Assessment Pain Assessment: No/denies pain    Home Living Family/patient expects to be discharged to:: Private residence Living Arrangements: Spouse/significant other Available Help at Discharge: Family;Available PRN/intermittently Type of Home: House Home Access: Stairs to enter   Entrance Stairs-Number of Steps: 1   Home Layout: One level Home Equipment:  Shower seat;Grab bars - tub/shower;Rolling Environmental consultant (2 wheels) Additional Comments: Wife works as Urology NP; likely to have daughter stay with pt when wife has to return to work    Prior Function Prior Level of Function : Independent/Modified Independent;Driving;History of Falls (last six months)             Mobility Comments: Using walker for mobility after recent stay at SNF s/p CABG -previously did not use AD.  Was getting HHPT after CABG.  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 ADLs Comments: Reports he was performing BADLs     Hand Dominance        Extremity/Trunk Assessment   Upper Extremity Assessment Upper Extremity Assessment: Generalized weakness (not formally tested-sternal prec)    Lower Extremity Assessment Lower Extremity Assessment: LLE deficits/detail;RLE deficits/detail RLE Deficits / Details: ROM WFL; MMT 5;5 LLE Deficits / Details: ROM WFL, MMT 4/5 - hx of CP L leg weaker at baseline    Cervical / Trunk Assessment Cervical / Trunk Assessment: Normal  Communication   Communication: No difficulties  Cognition Arousal/Alertness: Awake/alert Behavior During Therapy: WFL for tasks assessed/performed Overall Cognitive Status: Impaired/Different from baseline                     Current Attention Level: Selective Memory: Decreased short-term memory     Awareness: Anticipatory Problem Solving: Requires verbal cues          General Comments General comments (skin integrity, edema, etc.): Wife present throughout.  Pt on RA with sats 100%, HR 80's-90's, BP 110/68    Exercises     Assessment/Plan    PT Assessment Patient needs continued PT services  PT Problem List Decreased strength;Decreased activity tolerance;Decreased balance;Decreased mobility;Decreased knowledge of use of DME;Decreased knowledge of precautions;Cardiopulmonary status limiting activity;Decreased range of motion;Decreased safety awareness        PT Treatment Interventions DME instruction;Gait training;Stair training;Functional mobility training;Therapeutic activities;Therapeutic exercise;Balance training;Patient/family education    PT Goals (Current goals can be found in the Care Plan section)  Acute Rehab PT Goals Patient Stated Goal: return home PT Goal Formulation: With patient/family Time For Goal Achievement: 08/15/21 Potential to Achieve Goals: Good    Frequency Min 3X/week     Co-evaluation               AM-PAC PT "6 Clicks" Mobility  Outcome Measure Help needed turning from your back to your side while in a flat bed without using bedrails?: A Little Help needed moving from lying on your back to sitting on the side of a flat bed without using bedrails?: A Little Help needed moving to and from a bed to a chair (including a wheelchair)?: A Little Help needed standing up from a chair using your arms (e.g., wheelchair or bedside chair)?: A Little Help needed to walk in hospital room?: A Little Help needed climbing 3-5 steps with a railing? : A Little 6 Click Score: 18    End of Session Equipment Utilized During Treatment: Gait belt Activity Tolerance: Patient tolerated treatment well Patient left: in chair;with call bell/phone within reach;with family/visitor present Nurse Communication: Mobility status PT Visit Diagnosis: Other abnormalities  of gait and mobility (R26.89);Muscle weakness (generalized) (M62.81)    Time: 1642-9037 PT Time Calculation (min) (ACUTE ONLY): 17 min   Charges:   PT Evaluation $PT Eval Low Complexity: 1 Low          Matilde Markie, PT Acute Rehab Services Pager [phone] [facility] [phone]   Karlton Lemon 08/01/2021, 3:13 PM

## 2021-08-01 NOTE — Progress Notes (Signed)
Discussed with patient, family, Dr. Quentin Ore, and Pharm-D.   Will replace his escitalopram with sertraline given QT prolonging properties  Sertraline appears to be safest agent to go along with tikosyn; with the added benefit of monitoring in the the hospital while starting.   Brian Sullivan [address] [place], [place]  08/01/2021 2:47 PM

## 2021-08-02 ENCOUNTER — Inpatient Hospital Stay (HOSPITAL_COMMUNITY): Payer: Medicare Other

## 2021-08-02 ENCOUNTER — Other Ambulatory Visit (HOSPITAL_COMMUNITY): Payer: Self-pay

## 2021-08-02 DIAGNOSIS — I4891 Unspecified atrial fibrillation: Secondary | ICD-10-CM | POA: Diagnosis not present

## 2021-08-02 LAB — MAGNESIUM: Magnesium: 2.2 mg/dL (ref 1.7–2.4)

## 2021-08-02 LAB — BASIC METABOLIC PANEL
Anion gap: 7 (ref 5–15)
BUN: 10 mg/dL (ref 8–23)
CO2: 25 mmol/L (ref 22–32)
Calcium: 8.5 mg/dL — ABNORMAL LOW (ref 8.9–10.3)
Chloride: 104 mmol/L (ref 98–111)
Creatinine, Ser: 0.98 mg/dL (ref 0.61–1.24)
GFR, Estimated: >60 mL/min (ref >60)
Glucose, Bld: 111 mg/dL — ABNORMAL HIGH (ref 70–99)
Potassium: 3.9 mmol/L (ref 3.5–5.1)
Sodium: 136 mmol/L (ref 135–145)

## 2021-08-02 MED ORDER — ALUM & MAG HYDROXIDE-SIMETH 200-200-20 MG/5ML PO SUSP
30.0000 mL | ORAL | Status: DC | PRN
  Administered 2021-08-02: 14:00:00 30 mL via ORAL
  Filled 2021-08-02: qty 30

## 2021-08-02 MED ORDER — SODIUM CHLORIDE 0.9% FLUSH: 3.0000 mL | INTRAVENOUS | Status: DC | PRN

## 2021-08-02 MED ORDER — IOHEXOL 9 MG/ML PO SOLN
ORAL | Status: AC
  Filled 2021-08-02: qty 500

## 2021-08-02 MED ORDER — DOFETILIDE 500 MCG PO CAPS: 500.0000 ug | ORAL_CAPSULE | Freq: Two times a day (BID) | ORAL | Status: DC

## 2021-08-02 MED ORDER — IOHEXOL 300 MG/ML  SOLN
100.0000 mL | Freq: Once | INTRAMUSCULAR | Status: AC | PRN
  Administered 2021-08-02: 22:00:00 100 mL via INTRAVENOUS

## 2021-08-02 MED ORDER — POTASSIUM CHLORIDE CRYS ER 20 MEQ PO TBCR
40.0000 meq | EXTENDED_RELEASE_TABLET | Freq: Once | ORAL | Status: AC
  Administered 2021-08-02: 11:00:00 40 meq via ORAL
  Filled 2021-08-02: qty 2

## 2021-08-02 MED ORDER — SODIUM CHLORIDE 0.9% FLUSH
3.0000 mL | Freq: Two times a day (BID) | INTRAVENOUS | Status: DC
  Administered 2021-08-02 (×2): 3 mL via INTRAVENOUS

## 2021-08-02 MED ORDER — SODIUM CHLORIDE 0.9 % IV SOLN
8.0000 mg | Freq: Once | INTRAVENOUS | Status: AC
  Administered 2021-08-02: 23:00:00 8 mg via INTRAVENOUS
  Filled 2021-08-02: qty 4

## 2021-08-02 MED ORDER — SODIUM CHLORIDE 0.9 % IV SOLN: 250.0000 mL | INTRAVENOUS | Status: DC | PRN

## 2021-08-02 MED ORDER — METOPROLOL SUCCINATE ER 25 MG PO TB24
25.0000 mg | ORAL_TABLET | Freq: Every day | ORAL | Status: DC
  Administered 2021-08-02: 23:00:00 25 mg via ORAL
  Filled 2021-08-02: qty 1

## 2021-08-02 NOTE — Care Management (Signed)
1501 08-02-21 Case Manager submitted benefits check for Tikosyn. Case Manager will discuss cost and pharmacy of choice prior to transition home.

## 2021-08-02 NOTE — Progress Notes (Signed)
Pharmacy: Dofetilide (Tikosyn) - Initial Consult Assessment and Electrolyte Replacement  Pharmacy consulted to assist in monitoring and replacing electrolytes in this 76 y.o. male admitted on 07/27/2021 undergoing dofetilide initiation. First dofetilide dose: 08/02/21.  Assessment:  Patient Exclusion Criteria: If any screening criteria checked as "Yes", then  patient  should NOT receive dofetilide until criteria item is corrected.  If Yes please indicate correction plan.  YES  NO Patient  Exclusion Criteria Correction Plan   []   [x]   Baseline QTc interval is greater than or equal to 440 msec. IF above YES box checked dofetilide contraindicated unless patient has ICD; then may proceed if QTc 500-550 msec or with known ventricular conduction abnormalities may proceed with QTc 550-600 msec. QTc = 0.42    []   [x]   Patient is known or suspected to have a digoxin level greater than 2 ng/ml: No results found for: DIGOXIN     []   [x]   Creatinine clearance less than 20 ml/min (calculated using Cockcroft-Gault, actual body weight and serum creatinine): Estimated Creatinine Clearance: 67.2 mL/min (by C-G formula based on SCr of 0.98 mg/dL).     [x]   []  Patient has received drugs known to prolong the QT intervals within the last 48 hours (phenothiazines, tricyclics or tetracyclic antidepressants, erythromycin, H-1 antihistamines, cisapride, fluoroquinolones, azithromycin, ondansetron).   Updated information on QT prolonging agents is available to be searched on the following database:QT prolonging agents  Sotalol stopped 1/23 and we are monitoring qtc closely   []   [x]   Patient received a dose of hydrochlorothiazide (O[place]ic) alone or in any combination including triamterene (Dyazide, Maxzide) in the last 48 hours.    []   [x]  Patient received a medication known to increase dofetilide plasma concentrations prior to initial dofetilide dose:  Trimethoprim (Primsol, Proloprim) in the last 36  hours Verapamil (Calan, Verelan) in the last 36 hours or a sustained release dose in the last 72 hours Megestrol (Megace) in the last 5 days  Cimetidine (Tagamet) in the last 6 hours Ketoconazole (Nizoral) in the last 24 hours Itraconazole (Sporanox) in the last 48 hours  Prochlorperazine (Compazine) in the last 36 hours     []   [x]   Patient is known to have a history of torsades de pointes; congenital or acquired long QT syndromes.    []   [x]   Patient has received a Class 1 antiarrhythmic with less than 2 half-lives since last dose. (Disopyramide, Quinidine, Procainamide, Lidocaine, Mexiletine, Flecainide, Propafenone)    []   [x]   Patient has received amiodarone therapy in the past 3 months or amiodarone level is greater than 0.3 ng/ml.    Patient has been appropriately anticoagulated with apixaban.  Labs:    Component Value Date/Time   K 3.9 08/02/2021 0305   MG 2.2 08/02/2021 0305     Plan: Potassium: K 3.8-3.9:  Hold Tikosyn initiation and give KCl 40 mEq po x1 then begin Tikosyn at least 2hr after KCl dose - do not need to recheck K   Magnesium: Mg >2: Appropriate to initiate Tikosyn, no replacement needed     Thank you for allowing pharmacy to participate in this patient's care   Erin Hearing PharmD., BCPS Clinical Pharmacist 08/02/2021 3:23 PM

## 2021-08-02 NOTE — Progress Notes (Signed)
°   08/02/21 2241  Assess: MEWS Score  BP 122/76  Pulse Rate (!) 120  ECG Heart Rate (!) 123  Resp (!) 32  SpO2 94 %  O2 Device Room Air  Assess: MEWS Score  MEWS Temp 0  MEWS Systolic 0  MEWS Pulse 2  MEWS RR 2  MEWS LOC 0  MEWS Score 4  MEWS Score Color Red  Assess: if the MEWS score is Yellow or Red  Were vital signs taken at a resting state? Yes  Focused Assessment No change from prior assessment  Early Detection of Sepsis Score *See Row Information* Low  MEWS guidelines implemented *See Row Information* Yes  Take Vital Signs  Increase Vital Sign Frequency  Red: Q 1hr X 4 then Q 4hr X 4, if remains red, continue Q 4hrs  Escalate  MEWS: Escalate Red: discuss with charge nurse/RN and provider, consider discussing with RRT  Notify: Charge Nurse/RN  Name of Charge Nurse/RN Notified Heather RN  Date Charge Nurse/RN Notified 08/02/21  Time Charge Nurse/RN Notified 2351  Document  Patient Outcome Not stable and remains on department  Progress note created (see row info) Yes

## 2021-08-02 NOTE — Progress Notes (Signed)
Electrophysiology Rounding Note  Patient Name: Brian Sullivan Date of Encounter: 08/02/2021  Primary Cardiologist: Berniece Salines, DO Electrophysiologist: None   Subjective   The patient is doing well today.  At this time, the patient denies chest pain, shortness of breath, or any new concerns.  Inpatient Medications    Scheduled Meds:  amitriptyline  25 mg Oral BID   apixaban  5 mg Oral BID   aspirin EC  81 mg Oral Daily   dofetilide  500 mcg Oral BID   esomeprazole  20 mg Oral BID   feeding supplement  1 Container Oral TID BM   levothyroxine  50 mcg Oral Q0600   melatonin  3 mg Oral QHS   multivitamin with minerals  1 tablet Oral Daily   potassium chloride  40 mEq Oral Once   pravastatin  20 mg Oral q1800   sertraline  25 mg Oral Daily   sodium chloride flush  3 mL Intravenous Q12H   Continuous Infusions:  sodium chloride     PRN Meds: sodium chloride, acetaminophen, ondansetron (ZOFRAN) IV, sodium chloride flush, traMADol   Vital Signs    Vitals:   08/01/21 0949 08/01/21 1550 08/01/21 2040 08/02/21 0600  BP: 118/78 121/85 117/85 (!) 144/83  Pulse: 86 79 79 81  Resp:   18 18  Temp:   97.9 F (36.6 C) 97.8 F (36.6 C)  TempSrc:   Oral Oral  SpO2: 96% 99% 95% 96%  Weight:      Height:       No intake or output data in the 24 hours ending 08/02/21 0857 Filed Weights   08/02/2021 1611 08/01/21 0424  Weight: 92.8 kg 88.5 kg    Physical Exam    GEN- The patient is well appearing, alert and oriented x 3 today.   Head- normocephalic, atraumatic Eyes-  Sclera clear, conjunctiva pink Ears- hearing intact Oropharynx- clear Neck- supple Lungs- Clear to ausculation bilaterally, normal work of breathing Heart- Regular rate and rhythm, no murmurs, rubs or gallops GI- soft, NT, ND, + BS Extremities- no clubbing or cyanosis. No edema Skin- no rash or lesion Psych- euthymic mood, full affect Neuro- strength and sensation are intact  Labs    CBC Recent Labs     07/23/2021 1640  WBC 11.3*  NEUTROABS 7.4  HGB 12.2*  HCT 37.7*  MCV 92.4  PLT 409   Basic Metabolic Panel Recent Labs    08/01/21 0326 08/02/21 0305  NA 136 136  K 4.3 3.9  CL 107 104  CO2 21* 25  GLUCOSE 98 111*  BUN 10 10  CREATININE 0.87 0.98  CALCIUM 8.4* 8.5*  MG 1.9 2.2   Liver Function Tests Recent Labs    07/21/2021 1640  AST 19  ALT 16  ALKPHOS 85  BILITOT 0.8  PROT 6.9  ALBUMIN 3.1*   No results for input(s): LIPASE, AMYLASE in the last 72 hours. Cardiac Enzymes No results for input(s): CKTOTAL, CKMB, CKMBINDEX, TROPONINI in the last 72 hours.   Telemetry    NSR 70-90s (personally reviewed)  Radiology    ECHOCARDIOGRAM COMPLETE  Result Date: 08/01/2021    ECHOCARDIOGRAM REPORT   Patient Name:   Brian Sullivan Date of Exam: 08/01/2021 Medical Rec #:  [id number]    Height:       71.0 in Accession #:    [id number]   Weight:       195.0 lb Date of Birth:  [birth year]     BSA:  2.086 m Patient Age:    76 years     BP:           139/78 mmHg Patient Gender: M            HR:           81 bpm. Exam Location:  Inpatient Procedure: 2D Echo, Cardiac Doppler and Color Doppler Indications:    Ischemic CM  History:        Patient has prior history of Echocardiogram examinations, most                 recent 07/12/2021. CAD, Arrythmias:Atrial Fibrillation,                 Signs/Symptoms:Chest Pain; Risk Factors:Hypertension.  Sonographer:    Glo Herring Referring Phys: [id number] [place]  1. Mid and basal inferior wall hypokinesis . Left ventricular ejection fraction, by estimation, is 45 to 50%. The left ventricle has mildly decreased function. The left ventricle demonstrates regional wall motion abnormalities (see scoring diagram/findings for description). Left ventricular diastolic parameters are consistent with Grade I diastolic dysfunction (impaired relaxation).  2. Right ventricular systolic function is normal. The right ventricular size is normal.   3. The mitral valve is abnormal. Trivial mitral valve regurgitation. No evidence of mitral stenosis. Moderate mitral annular calcification.  4. The aortic valve is tricuspid. There is moderate calcification of the aortic valve. There is moderate thickening of the aortic valve. Aortic valve regurgitation is not visualized. Mild aortic valve stenosis.  5. The inferior vena cava is normal in size with greater than 50% respiratory variability, suggesting right atrial pressure of 3 mmHg. FINDINGS  Left Ventricle: Mid and basal inferior wall hypokinesis. Left ventricular ejection fraction, by estimation, is 45 to 50%. The left ventricle has mildly decreased function. The left ventricle demonstrates regional wall motion abnormalities. The left ventricular internal cavity size was normal in size. There is no left ventricular hypertrophy. Left ventricular diastolic parameters are consistent with Grade I diastolic dysfunction (impaired relaxation). Right Ventricle: The right ventricular size is normal. No increase in right ventricular wall thickness. Right ventricular systolic function is normal. Left Atrium: Left atrial size was normal in size. Right Atrium: Right atrial size was normal in size. Pericardium: There is no evidence of pericardial effusion. Mitral Valve: The mitral valve is abnormal. Moderate mitral annular calcification. Trivial mitral valve regurgitation. No evidence of mitral valve stenosis. Tricuspid Valve: The tricuspid valve is normal in structure. Tricuspid valve regurgitation is not demonstrated. No evidence of tricuspid stenosis. Aortic Valve: The aortic valve is tricuspid. There is moderate calcification of the aortic valve. There is moderate thickening of the aortic valve. Aortic valve regurgitation is not visualized. Mild aortic stenosis is present. Aortic valve mean gradient measures 5.8 mmHg. Aortic valve peak gradient measures 10.0 mmHg. Aortic valve area, by VTI measures 1.59 cm. Pulmonic Valve:  The pulmonic valve was normal in structure. Pulmonic valve regurgitation is not visualized. No evidence of pulmonic stenosis. Aorta: The aortic root is normal in size and structure. Venous: The inferior vena cava is normal in size with greater than 50% respiratory variability, suggesting right atrial pressure of 3 mmHg. IAS/Shunts: No atrial level shunt detected by color flow Doppler.  LEFT VENTRICLE PLAX 2D LVOT diam:     2.10 cm     Diastology LV SV:         52          LV e' medial:  6.15 cm/s LV SV Index:   25          LV E/e' medial:  9.9 LVOT Area:     3.46 cm    LV e' lateral:   5.48 cm/s                            LV E/e' lateral: 11.1  LV Volumes (MOD) LV vol d, MOD A2C: 73.1 ml LV vol d, MOD A4C: 82.9 ml LV vol s, MOD A2C: 42.8 ml LV vol s, MOD A4C: 52.4 ml LV SV MOD A2C:     30.3 ml LV SV MOD A4C:     82.9 ml LV SV MOD BP:      28.4 ml RIGHT VENTRICLE            IVC RV S prime:     6.90 cm/s  IVC diam: 1.70 cm LEFT ATRIUM             Index LA Vol (A2C):   69.2 ml 33.17 ml/m LA Vol (A4C):   34.2 ml 16.40 ml/m LA Biplane Vol: 49.7 ml 23.83 ml/m  AORTIC VALVE AV Area (Vmax):    1.50 cm AV Area (Vmean):   1.37 cm AV Area (VTI):     1.59 cm AV Vmax:           158.25 cm/s AV Vmean:          114.450 cm/s AV VTI:            0.324 m AV Peak Grad:      10.0 mmHg AV Mean Grad:      5.8 mmHg LVOT Vmax:         68.55 cm/s LVOT Vmean:        45.275 cm/s LVOT VTI:          0.149 m LVOT/AV VTI ratio: 0.46  AORTA Ao Root diam: 3.70 cm MITRAL VALVE               TRICUSPID VALVE MV Area (PHT): 2.92 cm    TR Peak grad:   17.5 mmHg MV Decel Time: 260 msec    TR Vmax:        209.00 cm/s MV E velocity: 60.80 cm/s MV A velocity: 86.10 cm/s  SHUNTS MV E/A ratio:  0.71        Systemic VTI:  0.15 m                            Systemic Diam: 2.10 cm Jenkins Rouge MD Electronically signed by Jenkins Rouge MD Signature Date/Time: 08/01/2021/11:41:39 AM    Final     Patient Profile     Brian Sullivan is a 76yo man with CAD s/p  CABG in 07/2021 c/b post operative atrial fibrillation who was admitted 07/23/2021 with symptomatic atrial fibrillation with RVR. He has previous intolerances documented with amiodarone and dronedarone. Sotalol stopped with broderline EF  Assessment & Plan    Atrial fibrillation, post operative Sotalol was started 07/29/2021 PM, then stopped 1/23 with EF <45%.  Start Tikosyn this evening.   2. CAD s/p CABG Cont asa and statin   3. Hypothyroidism   4. Depression Continue sertraline. Follow closely.  Encouraged ambulation and activity in and around room.   For questions or updates, please contact [facility] Please consult [email or web address] for contact info under Cardiology/STEMI.  Signed, Shirley Friar,  PA-C  08/02/2021, 8:57 AM

## 2021-08-02 NOTE — Progress Notes (Addendum)
° °  Called to see pt because of nausea w/ dry heaves, abd pain, diffuse but worse LLQ. Some difficult voiding also.   He has hx GERD, IBS w/ constipation, BPH.  Had similar sx when he was on amio, then multaq, there is concern for side effects from these meds, also recurrent Afib. They were d/c'd to start Tikosyn.   Has been urinating since off his tadalafil, that was d/c'd as well.  Decreased urination today, w/ some straining. No burning.  Had some shaking, no reported fevers. On exam, very few bowel sounds. Diffuse tenderness w/ no guarding, more tenderness LLQ. Abd soft, ?bladder fullness.   Plan: hold Tikosyn for now. Ck stat CT abd/pelvis w/ IV contrast, oral contrast per Radiology protocol. Discussed w/ Radiologist, that is the best test. With his CAD hx, very possibly has mesenteric ischemia. Discuss results w/ MD and decide on plan.    Rosaria Ferries, PA-C 08/02/2021 6:39 PM

## 2021-08-02 NOTE — TOC Benefit Eligibility Note (Signed)
Patient Teacher, English as a foreign language completed.    The patient is currently admitted and upon discharge could be taking dofetilide (Tikosyn) 500 mcg.  The current 30 day co-pay is, $96.00.   The patient is insured through [place], [place] Patient Advocate Specialist [place] Patient Advocate Team Direct Number: [phone]  Fax: [phone]

## 2021-08-02 NOTE — TOC Benefit Eligibility Note (Signed)
Patient Teacher, English as a foreign language completed.    The patient is currently admitted and upon discharge could be taking Entresto 24-26 mg.  The current 30 day co-pay is, $47.00.   The patient is currently admitted and upon discharge could be taking Jardiance 10 mg.  The current 30 day co-pay is, $47.00.   The patient is currently admitted and upon discharge could be taking Farxiga 10 mg.  The current 30 day co-pay is, $311.98 due to being non formulary.   The patient is insured through [place], [place] Patient Advocate Specialist [place] Patient Advocate Team Direct Number: [phone]  Fax: [phone]

## 2021-08-02 NOTE — Progress Notes (Addendum)
Paged to see patient for indigestion and concern over starting tikosyn.  Pt initially wanting to leave and not start tikosyn. Flare up of chronic intermittent abdominal pain had him concerned he was taken off chronic medications.  Reassurance given.    After long discussion pt agreed to stay and start tikosyn, as opposed to going home on toprol alone either today or tomorrow.   He had a large bowel movement this  am, and abdominal cramping currently in the setting of significant stress and anxiety of being in the hospital for the rest of the week.    Given maloox and zofran earlier by RN as prn.  Zofran now taken off list with plan to start tikosyn tonight. Recommend he follow up with his GI specialist as outpatient if discomfort continues despite chronic meds.  Consider work up if worsens, but this is an acute exacerbation of a chronic issue per history. He is able to carry on conversation with out obvious discomfort at the time of my exam.  Lollie Marrow, PA-C

## 2021-08-03 LAB — CBC WITH DIFFERENTIAL/PLATELET
Abs Immature Granulocytes: 0.24 10*3/uL — ABNORMAL HIGH (ref 0.00–0.07)
Basophils Absolute: 0.1 10*3/uL (ref 0.0–0.1)
Basophils Relative: 1 %
Eosinophils Absolute: 0.1 10*3/uL (ref 0.0–0.5)
Eosinophils Relative: 0 %
HCT: 33.3 % — ABNORMAL LOW (ref 39.0–52.0)
Hemoglobin: 10.7 g/dL — ABNORMAL LOW (ref 13.0–17.0)
Immature Granulocytes: 2 %
Lymphocytes Relative: 15 %
Lymphs Abs: 1.9 10*3/uL (ref 0.7–4.0)
MCH: 29.4 pg (ref 26.0–34.0)
MCHC: 32.1 g/dL (ref 30.0–36.0)
MCV: 91.5 fL (ref 80.0–100.0)
Monocytes Absolute: 1 10*3/uL (ref 0.1–1.0)
Monocytes Relative: 8 %
Neutro Abs: 9.9 10*3/uL — ABNORMAL HIGH (ref 1.7–7.7)
Neutrophils Relative %: 74 %
Platelets: 386 10*3/uL (ref 150–400)
RBC: 3.64 MIL/uL — ABNORMAL LOW (ref 4.22–5.81)
RDW: 13.8 % (ref 11.5–15.5)
WBC: 13.2 10*3/uL — ABNORMAL HIGH (ref 4.0–10.5)
nRBC: 0 % (ref 0.0–0.2)

## 2021-08-03 LAB — BASIC METABOLIC PANEL
Anion gap: 11 (ref 5–15)
BUN: 15 mg/dL (ref 8–23)
CO2: 20 mmol/L — ABNORMAL LOW (ref 22–32)
Calcium: 8.8 mg/dL — ABNORMAL LOW (ref 8.9–10.3)
Chloride: 100 mmol/L (ref 98–111)
Creatinine, Ser: 1.21 mg/dL (ref 0.61–1.24)
GFR, Estimated: >60 mL/min (ref >60)
Glucose, Bld: 165 mg/dL — ABNORMAL HIGH (ref 70–99)
Potassium: 5.8 mmol/L — ABNORMAL HIGH (ref 3.5–5.1)
Sodium: 131 mmol/L — ABNORMAL LOW (ref 135–145)

## 2021-08-03 LAB — MAGNESIUM: Magnesium: 2.1 mg/dL (ref 1.7–2.4)

## 2021-08-03 LAB — LACTIC ACID, PLASMA: Lactic Acid, Venous: 3.2 mmol/L (ref 0.5–1.9)

## 2021-08-04 NOTE — Progress Notes (Addendum)
Pt c/o abdominal pain and nausea.  Administered Maalox and zofran.  Pt states he is tired and does not want to have any testing and wants to go home.  Requested to talk to cardiologist to discuss any alternative options other than tikosyn. [place], PA made aware. Pt and wife spoke with PA, and decided to stay and try tikosyn.  Idolina Primer, RN

## 2021-08-09 ENCOUNTER — Ambulatory Visit: Payer: Medicare Other | Admitting: Student

## 2021-08-10 NOTE — Progress Notes (Signed)
Pt. Is back in the room  &  still c/o nausea .Zofran 8mg  IVPB given as ordered.

## 2021-08-10 NOTE — Progress Notes (Signed)
Wife at bedside & noted bruising on pt's incision site. Dr.Sullivan was called & also notified him about the radiologist message to tell MD to call them regarding the result of CTscan.

## 2021-08-10 NOTE — Progress Notes (Signed)
Pt.went down for CT of abd. As ordered.

## 2021-08-10 NOTE — Death Summary Note (Addendum)
DEATH SUMMARY   Patient Details  Name: Brian Sullivan MRN: [id number] DOB: May 21, 1946  Admission/Discharge Information   Admit Date:  25-Aug-2021  Date of Death: Date of Death: 08/29/21  Time of Death: Time of Death: 2036/10/18  Length of Stay: 4  Referring Physician: Nicoletta Dress, MD   Reason(s) for Hospitalization   Atrial fibrillation with RVR  Diagnoses  Admit Diagnosis: Atrial fibrillation with RVR Cause of death: Cardiac arrest Secondary Diagnoses: CAD s/p CABG Acute hypoxic respiratory failure in setting of cardiac arrest Hypothyroidism HLD Depression Chronic systolic CHF GERD    Summary of Hospital Course (including significant findings, care, treatment, consults, services provided and events leading to death):   Brian Sullivan is a 76yo man with CAD s/p CABG in 07/2021 c/b post operative atrial fibrillation who was admitted 2021/08/25 with symptomatic atrial fibrillation with RVR. He has previous intolerances documented to amiodarone and dronedarone.   Pt was noted to be in AF for at least the 6 days prior to his admission 08-25-21 and complaining of fatigue. Presented to the [facility] with HRs in 140-150s. Hadn't missed any doses of eliquis.   Cardioversion was attempted twice in the emergency department with early return to atrial fibrillation. Sotalol was given by the admitting doctor but this was stopped after electrophysiology saw the patient in order to transition him to dofetilide. The first dose of dofetilide was scheduled to start 08/02/2021. On 08/02/2021, the patient had a bowel movement and experienced some lower abdominal pain. He felt like it was an acute exacerbation of his chronic IBS / GERD symptoms. He was given symptomatic medications for his abdominal pain and nausea with maalox and zofran.   Cardiology called back to see patient in the evening with worsening abdominal pain. The pain was most severe in the LLQ. 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 CT chest was recommended due to fluid accumulation in the sternal area and pericardium. Chest CT was ordered and fellow arrived to re-assess patient. He noted bruising along his prior sternotomy scar and EKG showed tachycardia. Bedside POCUS showed small to moderate pericardial effusion.       Prior to being sent to the CT scanner a code blue was called on the patient. The patient reportedly became unresponsive and CPR was initiated. The patient was coded for ~30 minutes. 8 rounds of Epi and 2 AMPs of bicarb were given for PEA arrest. He underwent 2 defibrillations at 200 J and 250 J for Vfib and VT respectively. 1L of IVF was given wide open. The patient was intubated at the bedside. Given the concern for an effusion, Dr. Ali Lowe attempted a bedside pericardiocentesis which yielded some bloody fluid aspirated. Unfortunately, we were unable to achieve ROSC and the patient was pronounced deceased at 12:38 am.The patient's wife was notified and Chaplain services were provided.    Pertinent Labs and Studies  Significant Diagnostic Studies DG Chest 2 View  Result Date: 07/18/2021 CLINICAL DATA:  Status post coronary bypass graft. EXAM: CHEST - 2 VIEW COMPARISON:  July 17, 2021. FINDINGS: Stable cardiomegaly. Sternotomy wires are noted. Small bilateral pleural effusions are noted. No pneumothorax is noted. Mild left basilar subsegmental atelectasis is noted. IMPRESSION: Small bilateral pleural effusions. Mild left basilar subsegmental atelectasis. Electronically Signed   By: Marijo Conception M.D.   On: 07/18/2021 09:04   DG Chest 2 View  Result Date: 07/11/2021 CLINICAL DATA:  Sudden onset chest pain radiating to back EXAM: CHEST - 2 VIEW COMPARISON:  04/18/2020 FINDINGS: The heart size and mediastinal contours are within normal limits. Both lungs are clear. The visualized skeletal structures are unremarkable.  IMPRESSION: No active cardiopulmonary disease. Electronically Signed   By: Randa Ngo M.D.   On: 07/11/2021 15:23   CT ABDOMEN PELVIS W CONTRAST  Addendum Date: 08/02/2021   ADDENDUM REPORT: 08/02/2021 22:59 ADDENDUM: Please note the 2 lower most sternotomy wires are seen which do not appear to connect to the right side of the sternum. The visualized sternotomy is open. The fluid within the sternotomy is likely a seroma and less likely extension of pericardial fluid. Partially visualized pericardial effusion as described. Clinical correlation is recommended to exclude cardiac tamponade. Further evaluation with chest CT or echocardiogram is recommended. These results were called by telephone at the time of interpretation on 08/02/2021 at 10:57 pm to Dr. Conley Canal, who verbally acknowledged these results. Electronically Signed   By: Anner Crete M.D.   On: 08/02/2021 22:59   Result Date: 08/02/2021 CLINICAL DATA:  Left lower quadrant abdominal pain. EXAM: CT ABDOMEN AND PELVIS WITH CONTRAST TECHNIQUE: Multidetector CT imaging of the abdomen and pelvis was performed using the standard protocol following bolus administration of intravenous contrast. RADIATION DOSE REDUCTION: This exam was performed according to the departmental dose-optimization program which includes automated exposure control, adjustment of the mA and/or kV according to patient size and/or use of iterative reconstruction technique. CONTRAST:  162mL OMNIPAQUE IOHEXOL 300 MG/ML  SOLN COMPARISON:  Chest radiograph dated 08/09/2021 and CT dated 07/14/2021. FINDINGS: Lower chest: Partially visualized moderate left and small right pleural effusions. There is compressive atelectasis of the visualized left lower lobe. There is a large pericardial effusion which is partially visualized and measures up to 4 cm in thickness posterior to the heart. There is a partially visualized open sternotomy with approximately 3.3 cm distraction gap. There is  extension of pericardial fluid into the open sternotomy and anterior chest wall. Coronary vascular calcification as well as calcification of the mitral annulus. Chest CT is recommended for better evaluation of the lungs. No intra-abdominal free air or free fluid. Hepatobiliary: Fatty liver. No intrahepatic biliary dilatation. Cholecystectomy. Pancreas: Unremarkable. No pancreatic ductal dilatation or surrounding inflammatory changes. Spleen: Small calcified splenic granuloma. The spleen is otherwise unremarkable. Adrenals/Urinary Tract: The adrenal glands are unremarkable. There is no hydronephrosis on either side. There is symmetric enhancement and excretion of contrast by both kidneys. Small bilateral renal cysts measure up to 2.5 cm in the upper pole of the right kidney. The visualized ureters and urinary bladder appear unremarkable. Stomach/Bowel: Scattered sigmoid diverticula without active inflammatory changes. There is no bowel obstruction or active inflammation. The appendix is normal. Vascular/Lymphatic: Mild aortoiliac atherosclerotic disease. Nonocclusive clot noted in the infrarenal IVC extending into the left common iliac vein. No portal venous gas. There is no adenopathy. Reproductive: The prostate and seminal vesicles are grossly unremarkable. No pelvic mass. Other: Small fat containing umbilical and right inguinal hernia. Musculoskeletal: Degenerative changes of the spine. Total left hip arthroplasty. No acute osseous pathology. IMPRESSION: 1. Nonocclusive clot in the infrarenal IVC. 2. Partially visualized large pericardial effusion extending into the open sternotomy. Chest CT may provide better evaluation. 3. Partially visualized moderate left and small right pleural effusions with compressive atelectasis of the visualized left lower lobe. 4. Fatty liver. 5. Sigmoid diverticulosis. No bowel obstruction. Normal appendix. 6. Aortic Atherosclerosis (ICD10-I70.0). These results were called by telephone  at  the time of interpretation on 08/02/2021 at 10:37 pm to the patient's nurse, Columbres, who verbally acknowledged these results. Electronically Signed: By: Anner Crete M.D. On: 08/02/2021 22:44   CARDIAC CATHETERIZATION  Addendum Date: 07/13/2021     Prox RCA lesion is 90% stenosed.   Prox RCA to Mid RCA lesion is 90% stenosed.   Dist RCA lesion is 30% stenosed.   Prox Cx lesion is 40% stenosed.   2nd Mrg lesion is 80% stenosed.   Mid Cx lesion is 90% stenosed.   Mid LAD lesion is 85% stenosed.   Ost LAD to Prox LAD lesion is 50% stenosed. 1.  Severe heavily calcified three-vessel coronary artery disease. 2.  Left ventricular angiography was not performed.  EF was 50 to 55% by echo.  Normal left ventricular end-diastolic pressure at 6 mmHg. 3.  Mild aortic stenosis with peak to peak gradient of 16 mmHg. 3.  Very difficult procedure via the right radial artery due to tortuosity of the innominate artery and difficult engagement of the coronary arteries in spite of using a long sheath. Recommendations: Given diffuse calcified three-vessel coronary artery disease, recommend evaluation for CABG. Resume heparin at 6 PM.  Result Date: 07/13/2021   Prox RCA lesion is 90% stenosed.   Prox RCA to Mid RCA lesion is 90% stenosed.   Dist RCA lesion is 30% stenosed.   Prox Cx lesion is 40% stenosed.   2nd Mrg lesion is 80% stenosed.   Mid Cx lesion is 90% stenosed.   Mid LAD lesion is 85% stenosed.   Ost LAD to Prox LAD lesion is 50% stenosed. 1.  Severe heavily calcified three-vessel coronary artery disease. 2.  Left ventricular angiography was not performed.  EF was 50 to 55% by echo.  Normal left ventricular end-diastolic pressure at 6 mmHg. 3.  Mild aortic stenosis with peak to peak gradient of 16 mmHg. Recommendations: Given diffuse calcified three-vessel coronary artery disease, recommend evaluation for CABG. Resume heparin at 6 PM.   DG Chest Port 1 View  Result Date: 07/11/2021 CLINICAL DATA:  Shortness of  breath EXAM: PORTABLE CHEST 1 VIEW COMPARISON:  07/18/2021 FINDINGS: Prior CABG. Cardiomegaly. Small left pleural effusion with left lower lobe atelectasis or infiltrate. No confluent opacity on the right. No acute bony abnormality. IMPRESSION: Left effusion with left lower lobe atelectasis or infiltrate, both increasing since prior study. Cardiomegaly. Electronically Signed   By: Rolm Baptise M.D.   On: 07/28/2021 17:25   DG Chest Port 1 View  Result Date: 07/17/2021 CLINICAL DATA:  CABG 07/15/2021 EXAM: PORTABLE CHEST 1 VIEW COMPARISON:  Prior chest x-ray 07/16/2021 FINDINGS: Interval removal of Swan-Ganz catheter. Right IJ Cordis sheath remains in place. Left-sided chest tube and mediastinal drain remain in place. Patient is status post median sternotomy with evidence of prior multivessel CABG. Stable cardiac and mediastinal contours. No large effusion or pneumothorax. Low inspiratory volumes with bibasilar atelectasis. IMPRESSION: 1. Interval removal of Swan-Ganz catheter. Right IJ Cordis remains in place. 2. Mediastinal and left-sided chest tubes remain in place. 3. Low lung volumes with persistent bibasilar atelectasis. Electronically Signed   By: Jacqulynn Cadet M.D.   On: 07/17/2021 07:54   DG Chest Port 1 View  Result Date: 07/16/2021 CLINICAL DATA:  Chest pain EXAM: PORTABLE CHEST 1 VIEW COMPARISON:  07/15/2021 FINDINGS: 0607 hours The cardio pericardial silhouette is enlarged. Interval extubation and NG tube removal. Midline mediastinal/pericardial drain again noted. Left chest tube again noted without appreciable left-sided pneumothorax. Right IJ pulmonary  artery catheter tip is in the proximal right main pulmonary artery persistent bibasilar atelectasis, left greater than right. IMPRESSION: Stable exam. No evidence for left-sided pneumothorax with left chest tube in situ. Electronically Signed   By: Misty Stanley M.D.   On: 07/16/2021 08:33   DG Chest Port 1 View  Result Date:  07/15/2021 CLINICAL DATA:  Status post CABG. EXAM: PORTABLE CHEST 1 VIEW COMPARISON:  CT examination dated July 14, 2021 FINDINGS: Endotracheal tube approximately 4 cm above the carina. Feeding tube with distal tip in the esophagus. Right IJ access Swan-Ganz catheter with distal tip in the main pulmonary artery. The heart is markedly enlarged. Small bilateral pleural effusions. Left-sided chest tube is noted. No appreciable pneumothorax. IMPRESSION: 1. Feeding tube with distal tip in the esophagus and side port at the level of the clavicular heads. It needs to be advanced. The remaining lines and tubes are in satisfactory position. 2. Cardiomegaly with small bilateral pleural effusions. Electronically Signed   By: Keane Police D.O.   On: 07/15/2021 14:42   DG Abd Portable 1V  Result Date: 07/19/2021 CLINICAL DATA:  Abdomen pain EXAM: PORTABLE ABDOMEN - 1 VIEW COMPARISON:  None. FINDINGS: Mild air-filled small and large bowel. No definitive obstruction. No radiopaque calculi. Left hip replacement IMPRESSION: Mild air-filled small and large bowel without obstructive pattern, question mild ileus Electronically Signed   By: Donavan Foil M.D.   On: 07/19/2021 17:27   CT ANGIO CHEST AORTA W/CM & OR WO/CM  Result Date: 07/14/2021 CLINICAL DATA:  Aortic atherosclerosis EXAM: CT ANGIOGRAPHY CHEST WITH CONTRAST TECHNIQUE: Multidetector CT imaging of the chest was performed using the standard protocol during bolus administration of intravenous contrast. Multiplanar CT image reconstructions and MIPs were obtained to evaluate the vascular anatomy. CONTRAST:  131mL OMNIPAQUE IOHEXOL 350 MG/ML SOLN COMPARISON:  Chest radiographs done on 07/11/2021 FINDINGS: Cardiovascular: There are scattered coronary artery calcifications. There is homogeneous enhancement in thoracic aorta. There is ectasia of ascending thoracic aorta measuring 4.5 cm. There are no intraluminal filling defects in the pulmonary artery branches.  Mediastinum/Nodes: No significant lymphadenopathy seen. Lungs/Pleura: There is no focal pulmonary consolidation. There is subtle crowding of markings in the posterior lower lung fields. Small linear densities seen in the lingula and right middle lobe. Upper Abdomen: There is fatty infiltration in the liver. Surgical clips are seen in gallbladder fossa. There is 3.8 cm cyst in the upper pole of right kidney. There is 12 mm hypervascular structure in the spleen, possibly hemangioma. Musculoskeletal: Unremarkable. Review of the MIP images confirms the above findings. IMPRESSION: There is no evidence of thoracic aortic dissection. There is no evidence of pulmonary artery embolism. Coronary artery calcifications are seen. There is ectasia of ascending thoracic aorta measuring 4.5 cm. There is no focal pulmonary consolidation. There is no pleural effusion or pneumothorax. Fatty liver. Possible 12 mm hemangioma is seen in the spleen. Right renal cyst. Electronically Signed   By: Elmer Picker M.D.   On: 07/14/2021 11:23   ECHOCARDIOGRAM COMPLETE  Result Date: 08/01/2021    ECHOCARDIOGRAM REPORT   Patient Name:   JOSHUAN BOLANDER Date of Exam: 08/01/2021 Medical Rec #:  [id number]    Height:       71.0 in Accession #:    [id number]   Weight:       195.0 lb Date of Birth:  11-10-1945     BSA:          2.086 m Patient Age:    64 years  BP:           139/78 mmHg Patient Gender: M            HR:           81 bpm. Exam Location:  Inpatient Procedure: 2D Echo, Cardiac Doppler and Color Doppler Indications:    Ischemic CM  History:        Patient has prior history of Echocardiogram examinations, most                 recent 07/12/2021. CAD, Arrythmias:Atrial Fibrillation,                 Signs/Symptoms:Chest Pain; Risk Factors:Hypertension.  Sonographer:    Glo Herring Referring Phys: [id number] [place]  1. Mid and basal inferior wall hypokinesis . Left ventricular ejection fraction, by estimation, is 45  to 50%. The left ventricle has mildly decreased function. The left ventricle demonstrates regional wall motion abnormalities (see scoring diagram/findings for description). Left ventricular diastolic parameters are consistent with Grade I diastolic dysfunction (impaired relaxation).  2. Right ventricular systolic function is normal. The right ventricular size is normal.  3. The mitral valve is abnormal. Trivial mitral valve regurgitation. No evidence of mitral stenosis. Moderate mitral annular calcification.  4. The aortic valve is tricuspid. There is moderate calcification of the aortic valve. There is moderate thickening of the aortic valve. Aortic valve regurgitation is not visualized. Mild aortic valve stenosis.  5. The inferior vena cava is normal in size with greater than 50% respiratory variability, suggesting right atrial pressure of 3 mmHg. FINDINGS  Left Ventricle: Mid and basal inferior wall hypokinesis. Left ventricular ejection fraction, by estimation, is 45 to 50%. The left ventricle has mildly decreased function. The left ventricle demonstrates regional wall motion abnormalities. The left ventricular internal cavity size was normal in size. There is no left ventricular hypertrophy. Left ventricular diastolic parameters are consistent with Grade I diastolic dysfunction (impaired relaxation). Right Ventricle: The right ventricular size is normal. No increase in right ventricular wall thickness. Right ventricular systolic function is normal. Left Atrium: Left atrial size was normal in size. Right Atrium: Right atrial size was normal in size. Pericardium: There is no evidence of pericardial effusion. Mitral Valve: The mitral valve is abnormal. Moderate mitral annular calcification. Trivial mitral valve regurgitation. No evidence of mitral valve stenosis. Tricuspid Valve: The tricuspid valve is normal in structure. Tricuspid valve regurgitation is not demonstrated. No evidence of tricuspid stenosis. Aortic  Valve: The aortic valve is tricuspid. There is moderate calcification of the aortic valve. There is moderate thickening of the aortic valve. Aortic valve regurgitation is not visualized. Mild aortic stenosis is present. Aortic valve mean gradient measures 5.8 mmHg. Aortic valve peak gradient measures 10.0 mmHg. Aortic valve area, by VTI measures 1.59 cm. Pulmonic Valve: The pulmonic valve was normal in structure. Pulmonic valve regurgitation is not visualized. No evidence of pulmonic stenosis. Aorta: The aortic root is normal in size and structure. Venous: The inferior vena cava is normal in size with greater than 50% respiratory variability, suggesting right atrial pressure of 3 mmHg. IAS/Shunts: No atrial level shunt detected by color flow Doppler.  LEFT VENTRICLE PLAX 2D LVOT diam:     2.10 cm     Diastology LV SV:         52          LV e' medial:    6.15 cm/s LV SV Index:   25  LV E/e' medial:  9.9 LVOT Area:     3.46 cm    LV e' lateral:   5.48 cm/s                            LV E/e' lateral: 11.1  LV Volumes (MOD) LV vol d, MOD A2C: 73.1 ml LV vol d, MOD A4C: 82.9 ml LV vol s, MOD A2C: 42.8 ml LV vol s, MOD A4C: 52.4 ml LV SV MOD A2C:     30.3 ml LV SV MOD A4C:     82.9 ml LV SV MOD BP:      28.4 ml RIGHT VENTRICLE            IVC RV S prime:     6.90 cm/s  IVC diam: 1.70 cm LEFT ATRIUM             Index LA Vol (A2C):   69.2 ml 33.17 ml/m LA Vol (A4C):   34.2 ml 16.40 ml/m LA Biplane Vol: 49.7 ml 23.83 ml/m  AORTIC VALVE AV Area (Vmax):    1.50 cm AV Area (Vmean):   1.37 cm AV Area (VTI):     1.59 cm AV Vmax:           158.25 cm/s AV Vmean:          114.450 cm/s AV VTI:            0.324 m AV Peak Grad:      10.0 mmHg AV Mean Grad:      5.8 mmHg LVOT Vmax:         68.55 cm/s LVOT Vmean:        45.275 cm/s LVOT VTI:          0.149 m LVOT/AV VTI ratio: 0.46  AORTA Ao Root diam: 3.70 cm MITRAL VALVE               TRICUSPID VALVE MV Area (PHT): 2.92 cm    TR Peak grad:   17.5 mmHg MV Decel Time:  260 msec    TR Vmax:        209.00 cm/s MV E velocity: 60.80 cm/s MV A velocity: 86.10 cm/s  SHUNTS MV E/A ratio:  0.71        Systemic VTI:  0.15 m                            Systemic Diam: 2.10 cm Jenkins Rouge MD Electronically signed by Jenkins Rouge MD Signature Date/Time: 08/01/2021/11:41:39 AM    Final    ECHOCARDIOGRAM COMPLETE  Result Date: 07/12/2021    ECHOCARDIOGRAM REPORT   Patient Name:   Brian Sullivan Date of Exam: 07/12/2021 Medical Rec #:  [id number]    Height:       71.0 in Accession #:    [id number]   Weight:       203.0 lb Date of Birth:  18-Mar-1946     BSA:          2.122 m Patient Age:    55 years     BP:           135/90 mmHg Patient Gender: M            HR:           68 bpm. Exam Location:  Inpatient Procedure: 2D Echo, Cardiac Doppler, Color Doppler and Intracardiac  Opacification Agent Indications:    R07.9* Chest pain, unspecified  History:        Patient has prior history of Echocardiogram examinations, most                 recent 04/09/2020. CAD, Abnormal ECG; Signs/Symptoms:Chest Pain.  Sonographer:    Glo Herring Referring Phys: [id number] [place]  1. Left ventricular ejection fraction, by estimation, is 50 to 55%. The left ventricle has low normal function. The left ventricle has no regional wall motion abnormalities. Left ventricular diastolic parameters are indeterminate.  2. Right ventricular systolic function is normal. The right ventricular size is normal.  3. The mitral valve is normal in structure. No evidence of mitral valve regurgitation. No evidence of mitral stenosis.  4. The aortic valve is thickened and moderately calcified. Unable to determine level of aortic stenosis, AV doppler not well assessed. Unable to perform planimetry due to poor visibility of the aortic valve leaflet tips. May need a repeat imaging for better interogation of the aortic valve if clinical picture depicts. The aortic valve is calcified. Aortic valve regurgitation is not  visualized. No aortic stenosis is present.  5. Aortic dilatation noted. There is mild dilatation of the aortic root, measuring 39 mm. There is borderline dilatation of the ascending aorta, measuring 37 mm.  6. The inferior vena cava is normal in size with greater than 50% respiratory variability, suggesting right atrial pressure of 3 mmHg. FINDINGS  Left Ventricle: Left ventricular ejection fraction, by estimation, is 50 to 55%. The left ventricle has low normal function. The left ventricle has no regional wall motion abnormalities. The left ventricular internal cavity size was normal in size. There is no left ventricular hypertrophy. Left ventricular diastolic parameters are indeterminate. Right Ventricle: The right ventricular size is normal. No increase in right ventricular wall thickness. Right ventricular systolic function is normal. Left Atrium: Left atrial size was normal in size. Right Atrium: Right atrial size was normal in size. Pericardium: There is no evidence of pericardial effusion. Mitral Valve: The mitral valve is normal in structure. No evidence of mitral valve regurgitation. No evidence of mitral valve stenosis. MV peak gradient, 3.5 mmHg. The mean mitral valve gradient is 1.0 mmHg. Tricuspid Valve: The tricuspid valve is normal in structure. Tricuspid valve regurgitation is not demonstrated. No evidence of tricuspid stenosis. Aortic Valve: The aortic valve is thickened and moderately calcified. Unable to determine level of aortic stenosis, AV doppler not well assessed. Unable to perform planimetry due to poor visibility of the aortic valve leaflet tips. May need a repeat imaging for better interogation of the aortic valve if clinical picture depicts. The aortic valve is calcified. Aortic valve regurgitation is not visualized. No aortic stenosis is present. Aortic valve mean gradient measures 6.7 mmHg. Aortic valve peak gradient measures 12.5 mmHg. Pulmonic Valve: The pulmonic valve was normal in  structure. Pulmonic valve regurgitation is not visualized. No evidence of pulmonic stenosis. Aorta: Aortic dilatation noted. There is mild dilatation of the aortic root, measuring 39 mm. There is borderline dilatation of the ascending aorta, measuring 37 mm. Venous: The inferior vena cava is normal in size with greater than 50% respiratory variability, suggesting right atrial pressure of 3 mmHg. IAS/Shunts: No atrial level shunt detected by color flow Doppler.   Diastology LV e' medial:    4.65 cm/s LV E/e' medial:  14.5 LV e' lateral:   5.48 cm/s LV E/e' lateral: 12.3  RIGHT VENTRICLE RV Basal diam:  4.00  cm RV S prime:     13.70 cm/s LEFT ATRIUM           Index        RIGHT ATRIUM           Index LA Vol (A2C): 50.0 ml 23.56 ml/m  RA Area:     19.30 cm LA Vol (A4C): 42.0 ml 19.79 ml/m  RA Volume:   61.70 ml  29.08 ml/m  AORTIC VALVE AV Vmax:           176.67 cm/s AV Vmean:          122.333 cm/s AV VTI:            0.341 m AV Peak Grad:      12.5 mmHg AV Mean Grad:      6.7 mmHg LVOT Vmax:         63.15 cm/s LVOT Vmean:        45.250 cm/s LVOT VTI:          0.133 m LVOT/AV VTI ratio: 0.39  AORTA Ao Root diam: 3.90 cm Ao Asc diam:  3.70 cm MITRAL VALVE MV Area (PHT): 3.33 cm    SHUNTS MV Peak grad:  3.5 mmHg    Systemic VTI: 0.13 m MV Mean grad:  1.0 mmHg MV Vmax:       0.94 m/s MV Vmean:      50.0 cm/s MV Decel Time: 228 msec MV E velocity: 67.30 cm/s MV A velocity: 93.00 cm/s MV E/A ratio:  0.72 Kardie Tobb DO Electronically signed by Berniece Salines DO Signature Date/Time: 07/12/2021/2:24:59 PM    Final    ECHO INTRAOPERATIVE TEE  Result Date: 07/16/2021  *INTRAOPERATIVE TRANSESOPHAGEAL REPORT *  Patient Name:   Brian Sullivan Date of Exam: 07/15/2021 Medical Rec #:  [id number]    Height:       71.0 in Accession #:    [id number]   Weight:       203.0 lb Date of Birth:  11-Nov-1945     BSA:          2.12 m Patient Age:    59 years     BP:           156/95 mmHg Patient Gender: M            HR:           62 bpm. Exam  Location:  Inpatient Transesophogeal exam was perform intraoperatively during surgical procedure. Patient was closely monitored under general anesthesia during the entirety of examination. 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 POST-OP IMPRESSIONS Overall, there were no significant changes from pre-bypass. PRE-OP FINDINGS  Left Ventricle: The left ventricle has mildly reduced systolic function, with an ejection fraction of 45-50%. The cavity size was normal. There is no increase in left ventricular wall thickness. There is no left ventricular hypertrophy. Right Ventricle: The right ventricle has normal systolic function. The cavity was normal. There is no increase in right ventricular wall thickness. Left Atrium: Left atrial size was normal in size. No left atrial/left atrial appendage thrombus was detected. Right Atrium: Right atrial size was normal in size. Interatrial Septum: No atrial level shunt detected  by color flow Doppler. Pericardium: There is no evidence of pericardial effusion. Mitral Valve: The mitral valve is normal in structure. Mitral valve regurgitation is mild by color flow Doppler. The MR jet is centrally-directed. There is no evidence of mitral stenosis. Both mitral valve leaflets are mildly thickened. Tricuspid Valve: The tricuspid valve was normal in structure. Tricuspid valve regurgitation is trivial by color flow Doppler. Aortic Valve: The aortic valve is tricuspid. Aortic valve regurgitation is trivial by color flow Doppler. 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 Mean PG 9.0 mmHg. There is moderate calcification and thickening throughout the aortic valve. All 3 leaflets are mildly restricted in mobility. Pulmonic Valve: The pulmonic valve  was normal in structure. Pulmonic valve regurgitation is trivial by color flow Doppler. Aorta: The aortic root, ascending aorta and aortic arch are normal in size and structure. +-------------+--------++  AORTIC VALVE             +-------------+--------++  AV Mean Grad: 9.0 mmHg   +-------------+--------++  Albertha Ghee MD Electronically signed by Albertha Ghee MD Signature Date/Time: 07/16/2021/7:23:55 AM    Final    VAS US DOPPLER PRE CABG  Result Date: 07/14/2021 PREOPERATIVE VASCULAR EVALUATION Patient Name:  Brian Sullivan  Date of Exam:   07/14/2021 Medical Rec #: [id number]     Accession #:    [id number] Date of Birth: [birth year]      Patient Gender: M Patient Age:   22 years Exam Location:  [facility] Procedure:      VAS US DOPPLER PRE CABG Referring Phys: [place] VANTRIGT --------------------------------------------------------------------------------  Indications:      Pre-CABG. Risk Factors:     Hypertension, hyperlipidemia, coronary artery disease. Comparison Study: no prior Performing Technologist: Archie Patten RVS  Examination Guidelines: A complete evaluation includes B-mode imaging, spectral Doppler, color Doppler, and power Doppler as needed of all accessible portions of each vessel. Bilateral testing is considered an integral part of a complete examination. Limited examinations for reoccurring indications may be performed as noted.  Right Carotid Findings: +----------+--------+--------+--------+------------+--------+             PSV cm/s EDV cm/s Stenosis Describe     Comments  +----------+--------+--------+--------+------------+--------+  CCA Prox   54       15                heterogenous           +----------+--------+--------+--------+------------+--------+  CCA Distal 66       15                heterogenous           +----------+--------+--------+--------+------------+--------+  ICA Prox   76       21       1-39%    heterogenous            +----------+--------+--------+--------+------------+--------+  ICA Distal 81       20                                       +----------+--------+--------+--------+------------+--------+  ECA        209                                               +----------+--------+--------+--------+------------+--------+ +----------+--------+-------+--------+------------+  PSV cm/s EDV cms Describe Arm Pressure  +----------+--------+-------+--------+------------+  Subclavian 77                                      +----------+--------+-------+--------+------------+ +---------+--------+--------+--------------+  Vertebral PSV cm/s EDV cm/s Not identified  +---------+--------+--------+--------------+ Left Carotid Findings: +----------+--------+--------+--------+------------+--------+             PSV cm/s EDV cm/s Stenosis Describe     Comments  +----------+--------+--------+--------+------------+--------+  CCA Prox   82       20                heterogenous           +----------+--------+--------+--------+------------+--------+  CCA Distal 70       12                heterogenous           +----------+--------+--------+--------+------------+--------+  ICA Prox   63       20       1-39%    heterogenous           +----------+--------+--------+--------+------------+--------+  ICA Distal 61       19                                       +----------+--------+--------+--------+------------+--------+  ECA        154                                               +----------+--------+--------+--------+------------+--------+ +----------+--------+--------+--------+------------+  Subclavian PSV cm/s EDV cm/s Describe Arm Pressure  +----------+--------+--------+--------+------------+             142                                      +----------+--------+--------+--------+------------+ +---------+--------+--------+--------------+  Vertebral PSV cm/s EDV cm/s Not identified  +---------+--------+--------+--------------+  ABI Findings:  +--------+------------------+-----+---------+--------+  Right    Rt Pressure (mmHg) Index Waveform  Comment   +--------+------------------+-----+---------+--------+  Brachial                          triphasic           +--------+------------------+-----+---------+--------+  ATA                               triphasic           +--------+------------------+-----+---------+--------+  PTA                               triphasic           +--------+------------------+-----+---------+--------+ +--------+------------------+-----+---------+-------+  Left     Lt Pressure (mmHg) Index Waveform  Comment  +--------+------------------+-----+---------+-------+  Brachial                          triphasic          +--------+------------------+-----+---------+-------+  ATA  triphasic          +--------+------------------+-----+---------+-------+  PTA                               triphasic          +--------+------------------+-----+---------+-------+  Right Doppler Findings: +--------+--------+-----+---------+--------+  Site     Pressure Index Doppler   Comments  +--------+--------+-----+---------+--------+  Brachial                triphasic           +--------+--------+-----+---------+--------+  Radial                  biphasic            +--------+--------+-----+---------+--------+  Ulnar                   biphasic            +--------+--------+-----+---------+--------+  Left Doppler Findings: +--------+--------+-----+---------+--------+  Site     Pressure Index Doppler   Comments  +--------+--------+-----+---------+--------+  Brachial                triphasic           +--------+--------+-----+---------+--------+  Radial                  triphasic           +--------+--------+-----+---------+--------+  Ulnar                   biphasic            +--------+--------+-----+---------+--------+  Summary: Right Carotid: Velocities in the right ICA are consistent with a 1-39% stenosis. Left Carotid:  Velocities in the left ICA are consistent with a 1-39% stenosis. Vertebrals: Bilateral vertebral arteries were not visualized. Right Upper Extremity: Doppler waveforms remain within normal limits with right radial compression. Doppler waveforms remain within normal limits with right ulnar compression. Left Upper Extremity: Doppler waveforms remain within normal limits with left radial compression. Doppler waveform obliterate with left ulnar compression.  Electronically signed by Jamelle Haring on 07/14/2021 at 4:10:14 PM.    Final     Microbiology Recent Results (from the past 240 hour(s))  Resp Panel by RT-PCR (Flu A&B, Covid) Nasopharyngeal Swab     Status: None   Collection Time: 07/10/2021  5:59 PM   Specimen: Nasopharyngeal Swab; Nasopharyngeal(NP) swabs in vial transport medium  Result Value Ref Range Status   SARS Coronavirus 2 by RT PCR NEGATIVE NEGATIVE Final    Comment: (NOTE) SARS-CoV-2 target nucleic acids are NOT DETECTED.  The SARS-CoV-2 RNA is generally detectable in upper respiratory specimens during the acute phase of infection. The lowest concentration of SARS-CoV-2 viral copies this assay can detect is 138 copies/mL. A negative result does not preclude SARS-Cov-2 infection and should not be used as the sole basis for treatment or other patient management decisions. A negative result may occur with  improper specimen collection/handling, submission of specimen other than nasopharyngeal swab, presence of viral mutation(s) within the areas targeted by this assay, and inadequate number of viral copies(<138 copies/mL). A negative result must be combined with clinical observations, patient history, and epidemiological information. The expected result is Negative.  Fact Sheet for Patients:  EntrepreneurPulse.com.au  Fact Sheet for Healthcare Providers:  IncredibleEmployment.be  This test is no t yet approved or cleared by the [place] FDA  and  has been authorized for detection and/or diagnosis of SARS-CoV-2  by FDA under an Emergency Use Authorization (EUA). This EUA will remain  in effect (meaning this test can be used) for the duration of the COVID-19 declaration under Section 564(b)(1) of the Act, 21 U.S.C.section 360bbb-3(b)(1), unless the authorization is terminated  or revoked sooner.       Influenza A by PCR NEGATIVE NEGATIVE Final   Influenza B by PCR NEGATIVE NEGATIVE Final    Comment: (NOTE) The Xpert Xpress SARS-CoV-2/FLU/RSV plus assay is intended as an aid in the diagnosis of influenza from Nasopharyngeal swab specimens and should not be used as a sole basis for treatment. Nasal washings and aspirates are unacceptable for Xpert Xpress SARS-CoV-2/FLU/RSV testing.  Fact Sheet for Patients: EntrepreneurPulse.com.au  Fact Sheet for Healthcare Providers: IncredibleEmployment.be  This test is not yet approved or cleared by the [place] FDA and has been authorized for detection and/or diagnosis of SARS-CoV-2 by FDA under an Emergency Use Authorization (EUA). This EUA will remain in effect (meaning this test can be used) for the duration of the COVID-19 declaration under Section 564(b)(1) of the Act, 21 U.S.C. section 360bbb-3(b)(1), unless the authorization is terminated or revoked.  Performed at [facility], [place] [address] [place], [place] [address]     Lab Basic Metabolic Panel: Recent Labs  Lab 07/29/2021 1640 07/31/21 0548 08/01/21 0326 08/02/21 0305 08/02/21 2348  NA 135 135 136 136 131*  K 4.2 4.1 4.3 3.9 5.8*  CL 101 105 107 104 100  CO2 25 23 21* 25 20*  GLUCOSE 105* 95 98 111* 165*  BUN 8 9 10 10 15   CREATININE 1.08 0.98 0.87 0.98 1.21  CALCIUM 8.9 8.2* 8.4* 8.5* 8.8*  MG  --   --  1.9 2.2 2.1   Liver Function Tests: Recent Labs  Lab 07/10/2021 1640  AST 19  ALT 16  ALKPHOS 85  BILITOT 0.8  PROT 6.9  ALBUMIN 3.1*   No results  for input(s): LIPASE, AMYLASE in the last 168 hours. No results for input(s): AMMONIA in the last 168 hours. CBC: Recent Labs  Lab 07/14/2021 1640 08/02/21 2348  WBC 11.3* 13.2*  NEUTROABS 7.4 9.9*  HGB 12.2* 10.7*  HCT 37.7* 33.3*  MCV 92.4 91.5  PLT 343 386   Cardiac Enzymes: No results for input(s): CKTOTAL, CKMB, CKMBINDEX, TROPONINI in the last 168 hours. Sepsis Labs: Recent Labs  Lab 07/24/2021 1640 08/02/21 2348  WBC 11.3* 13.2*  LATICACIDVEN  --  3.2*     Cheney Gosch T. Quentin Ore, MD, [facility], [facility] Cardiac Electrophysiology

## 2021-08-10 NOTE — Progress Notes (Signed)
Valuables given to the wife .Post mortem done Patient was brought down to the morgue by Linzie Collin NT.

## 2021-08-10 NOTE — Progress Notes (Signed)
Pt.still c/o nausea & dry heaves & abd.pain.MD on call Dr.Sullivan was called & made aware.& said he will order something .

## 2021-08-10 NOTE — Progress Notes (Signed)
Dr.Sullivan came to see pt.& ordered stat EKG  & cardiac [place] at bedside.& ordered stat labs.

## 2021-08-10 NOTE — Significant Event (Signed)
I was called to the patient's bedside 10:49 pm regarding bruising on his chest. The patient had just come back from undergoing a CT A/P for ongoing abdominal pain and nausea. I spoke with the radiologist who was concerned that there was a large pericardial effusion and possible dehiscence of his sternotomy. He recommended a chest CT. When I arrived to the patient's bedside immediately, the patient was noted to be A&Ox1 but alert, laying flat in bed, normotensive and tachycardic to the 120s. His PEx was notable for bruising along his prior sternotomy scar. I obtained an EKG which showed sinus tachycardia. My bedside POCUS showed a small to moderate pericardial effusion. Given the patient's tachycardia and general appearance, I ordered STAT BMP, CBC, lactate and STAT Chest CT to better characterize the chest pathology. Prior to the patient getting to the CT scanner her coded.   I was paged back to the patient's room at ~12:08 am for a code blue. The patient reportedly became unresponsive and CPR was initiated. The patient was coded for ~30 minutes. 8 rounds of Epi and 2 AMPs of bicarb were given for PEA arrest. He underwent 2 defibrillations at 200 J and 250 J for Vfib and VT respectively. 1L of IVF was given wide open. The patient was intubated at the bedside. Given the concern for an effusion, Dr. Ali Lowe attempted a bedside pericardiocentesis which yielded some bloody fluid aspirated. Unfortunately, we were unable to achieve ROSC and the patient was pronounced deceased at 12:38 am.The patient's wife was notified and Chaplain services were provided.

## 2021-08-10 NOTE — Progress Notes (Signed)
Follow up call done again to MD on call for pt's c/o nausea & dry heaves & made him aware that pt.can't tolerate the oral contrast for CT of abd.& said he will order now.

## 2021-08-10 NOTE — Progress Notes (Signed)
Pt.was pronounced dead by Dr.Sullivan.

## 2021-08-10 NOTE — Progress Notes (Signed)
Pt's wife called to the room & saying pt.is having seizures.Martin Majestic to the room & found pt.is pale looking & nonresponsive.Pt's pulse is weak .Started chest compression & called code blue.

## 2021-08-10 DEATH — deceased

## 2021-08-12 MED FILL — Medication: Qty: 1 | Status: AC

## 2021-08-15 ENCOUNTER — Ambulatory Visit: Payer: Medicare Other | Admitting: Cardiothoracic Surgery

## 2021-08-23 ENCOUNTER — Ambulatory Visit: Payer: Medicare Other | Admitting: Student

## 2021-11-02 ENCOUNTER — Ambulatory Visit: Payer: Medicare Other | Admitting: Cardiology

## 2022-07-03 IMAGING — CT CT ABD-PELV W/ CM
2 of 5 series · 13 of 46 positions shown, 15 images · IV contrast (APPLIED)
Comparison: Chest radiograph dated 07/30/2021 and CT dated
07/14/2021.
COMPARISON: Chest radiograph dated 07/30/2021 and CT dated
07/14/2021.

Addendum:
CLINICAL DATA: Left lower quadrant abdominal pain.

EXAM:
CT ABDOMEN AND PELVIS WITH CONTRAST
TECHNIQUE: Multidetector CT imaging of the abdomen and pelvis was performed
using the standard protocol following bolus administration of
intravenous contrast.

[Series 3: abd/ pelvis 5.0 i30f 2 (person_name) · axial · 0.98mm/px · z∈[+692,+1142]mm · 10 of 101 slices shown, 12 images]
[im 6/101  soft-tissue]
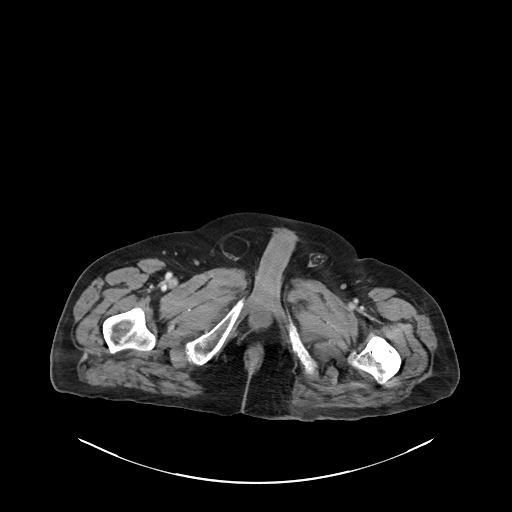
[im 6/101  bone]
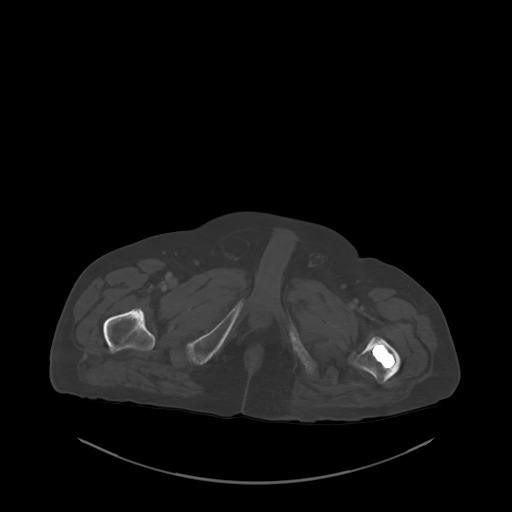
[im 16/101  soft-tissue]
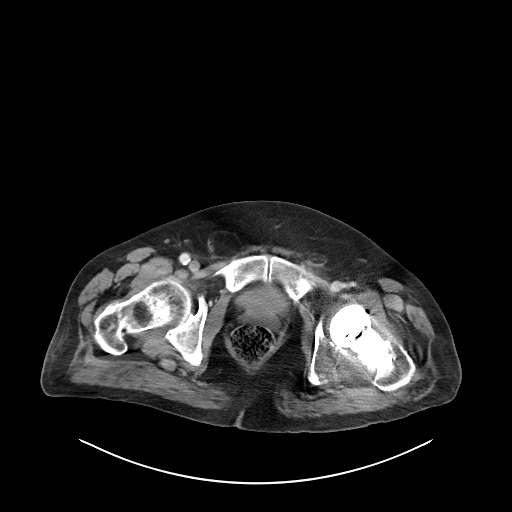
[im 26/101  soft-tissue]
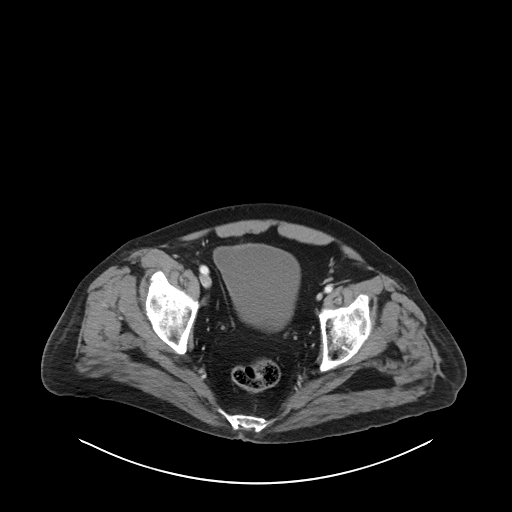
[im 36/101  soft-tissue]
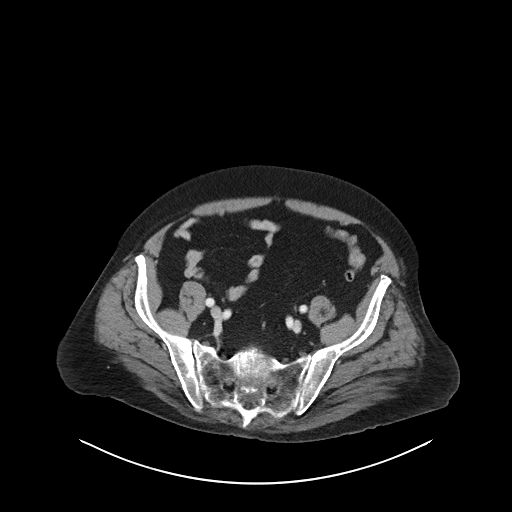
[im 46/101  soft-tissue]
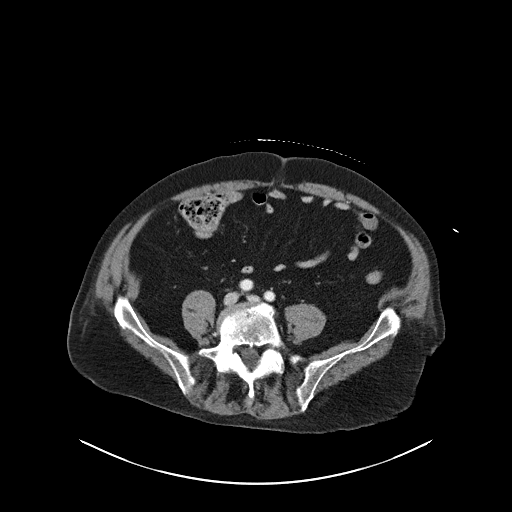
[im 56/101  soft-tissue]
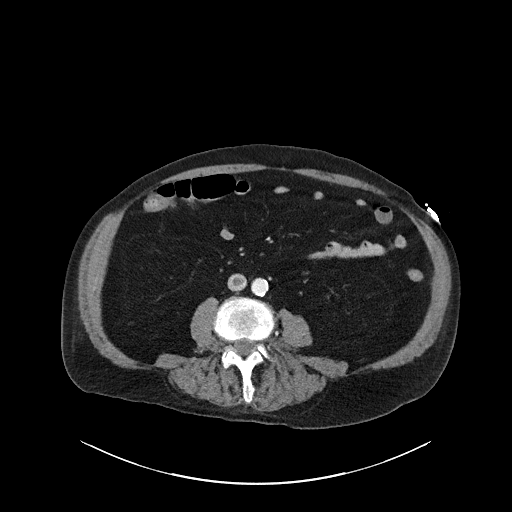
[im 66/101  soft-tissue]
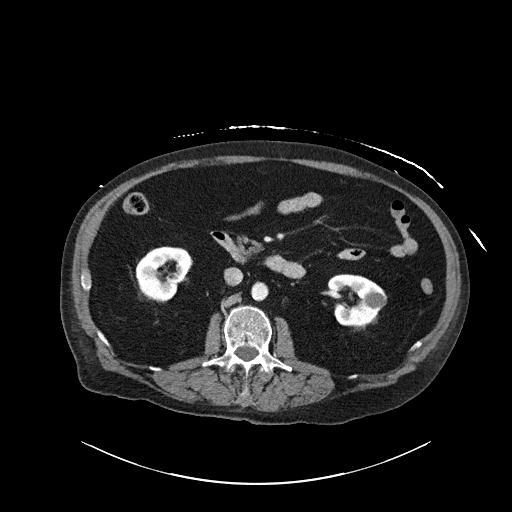
[im 76/101  soft-tissue]
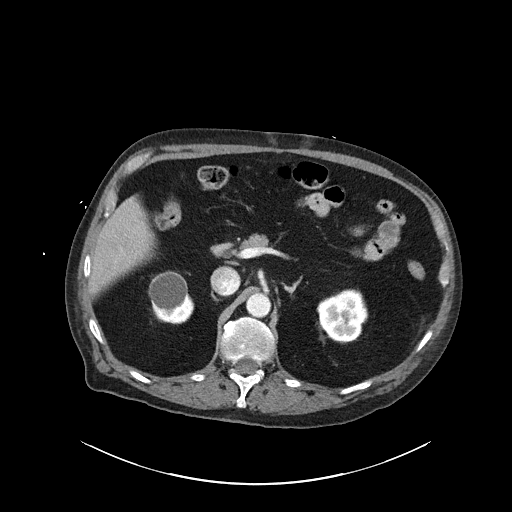
[im 86/101  soft-tissue]
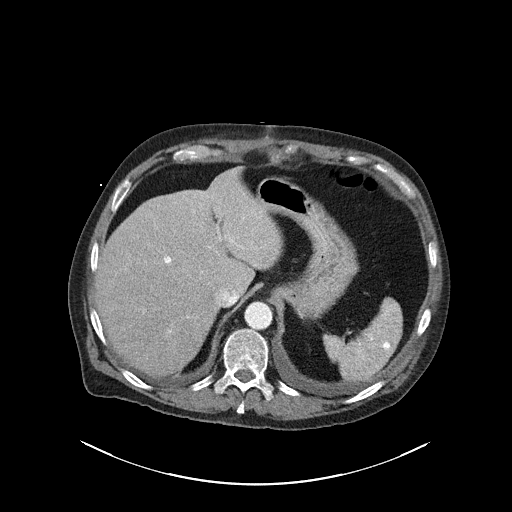
[im 86/101  bone]
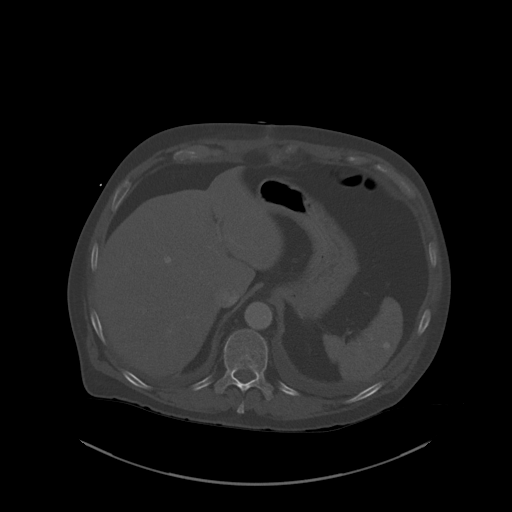
[im 96/101  soft-tissue]
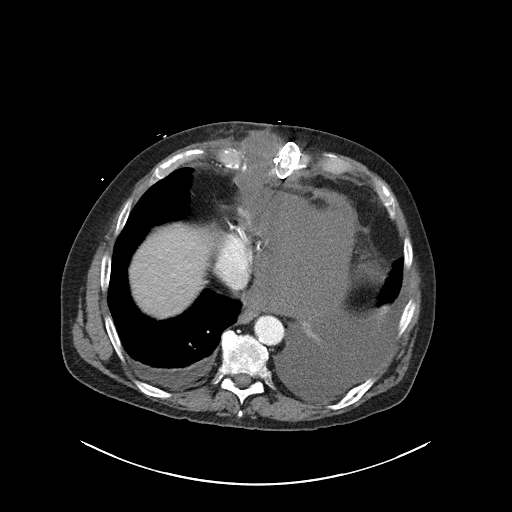

[Series 7: coronal soft tissue · coronal · 0.96mm/px · 3 of 102 slices shown]
[im 34/102  soft-tissue]
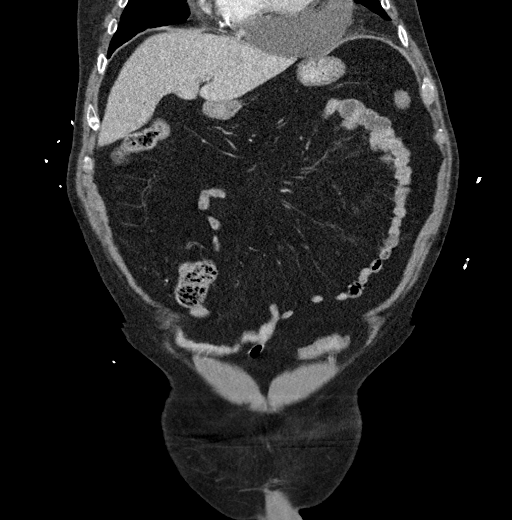
[im 45/102  soft-tissue]
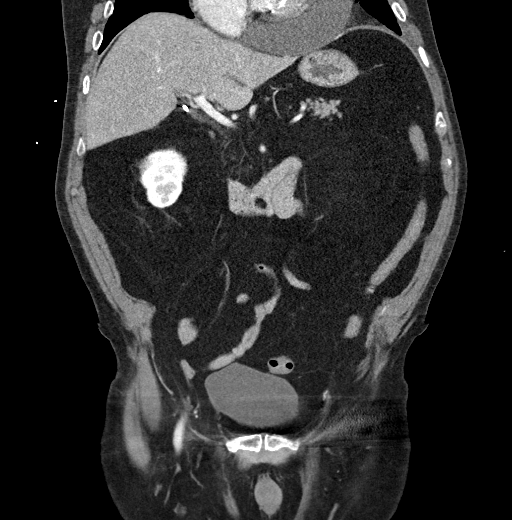
[im 57/102  soft-tissue]
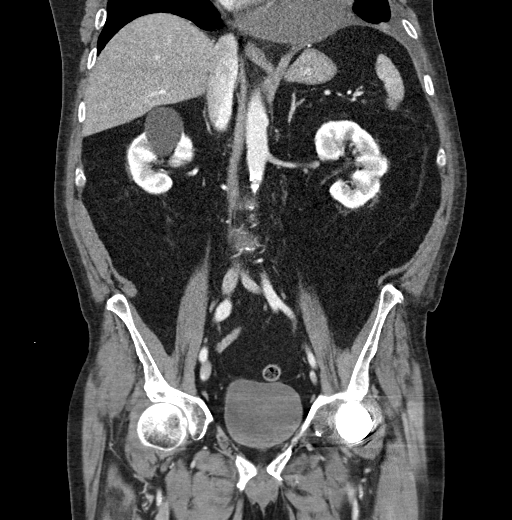

[13 of 46 positions shown; findings below may reference images not displayed]

RADIATION DOSE REDUCTION: This exam was performed according to the
departmental dose-optimization program which includes automated
exposure control, adjustment of the mA and/or kV according to
patient size and/or use of iterative reconstruction technique.

CONTRAST:  100mL OMNIPAQUE IOHEXOL 300 MG/ML  SOLN
FINDINGS: Lower chest: Partially visualized moderate left and small right
pleural effusions. There is compressive atelectasis of the
visualized left lower lobe. There is a large pericardial effusion
which is partially visualized and measures up to 4 cm in thickness
posterior to the heart. There is a partially visualized open
sternotomy with approximately 3.3 cm distraction gap. There is
extension of pericardial fluid into the open sternotomy and anterior
chest wall. Coronary vascular calcification as well as calcification
of the mitral annulus. Chest CT is recommended for better evaluation
of the lungs.

No intra-abdominal free air or free fluid.

Hepatobiliary: Fatty liver. No intrahepatic biliary dilatation.
Cholecystectomy.

Pancreas: Unremarkable. No pancreatic ductal dilatation or
surrounding inflammatory changes.

Spleen: Small calcified splenic granuloma. The spleen is otherwise
unremarkable.

Adrenals/Urinary Tract: The adrenal glands are unremarkable. There
is no hydronephrosis on either side. There is symmetric enhancement
and excretion of contrast by both kidneys. Small bilateral renal
cysts measure up to 2.5 cm in the upper pole of the right kidney.
The visualized ureters and urinary bladder appear unremarkable.

Stomach/Bowel: Scattered sigmoid diverticula without active
inflammatory changes. There is no bowel obstruction or active
inflammation. The appendix is normal.

Vascular/Lymphatic: Mild aortoiliac atherosclerotic disease.
Nonocclusive clot noted in the infrarenal IVC extending into the
left common iliac vein. No portal venous gas. There is no
adenopathy.

Reproductive: The prostate and seminal vesicles are grossly
unremarkable. No pelvic mass.

Other: Small fat containing umbilical and right inguinal hernia.

Musculoskeletal: Degenerative changes of the spine. Total left hip
arthroplasty. No acute osseous pathology.
IMPRESSION: 1. Nonocclusive clot in the infrarenal IVC.
2. Partially visualized large pericardial effusion extending into
the open sternotomy. Chest CT may provide better evaluation.
3. Partially visualized moderate left and small right pleural
effusions with compressive atelectasis of the visualized left lower
lobe.
4. Fatty liver.
5. Sigmoid diverticulosis. No bowel obstruction. Normal appendix.
6. Aortic Atherosclerosis (HP9BL-MB1.1).

These results were called by telephone at the time of interpretation
on 08/02/2021 at [DATE] to the patient's nurse, [REDACTED], who
verbally acknowledged these results.

ADDENDUM:
Please note the 2 lower most sternotomy wires are seen which do not
appear to connect to the right side of the sternum. The visualized
sternotomy is open. The fluid within the sternotomy is likely a
seroma and less likely extension of pericardial fluid.

Partially visualized pericardial effusion as described. Clinical
correlation is recommended to exclude cardiac tamponade. Further
evaluation with chest CT or echocardiogram is recommended.

These results were called by telephone at the time of interpretation
on 08/02/2021 at [DATE] to Dr. Tayo, who verbally acknowledged
these results.

*** End of Addendum ***
RADIATION DOSE REDUCTION: This exam was performed according to the
departmental dose-optimization program which includes automated
exposure control, adjustment of the mA and/or kV according to
patient size and/or use of iterative reconstruction technique.

CONTRAST:  100mL OMNIPAQUE IOHEXOL 300 MG/ML  SOLN
FINDINGS: Lower chest: Partially visualized moderate left and small right
pleural effusions. There is compressive atelectasis of the
visualized left lower lobe. There is a large pericardial effusion
which is partially visualized and measures up to 4 cm in thickness
posterior to the heart. There is a partially visualized open
sternotomy with approximately 3.3 cm distraction gap. There is
extension of pericardial fluid into the open sternotomy and anterior
chest wall. Coronary vascular calcification as well as calcification
of the mitral annulus. Chest CT is recommended for better evaluation
of the lungs.

No intra-abdominal free air or free fluid.

Hepatobiliary: Fatty liver. No intrahepatic biliary dilatation.
Cholecystectomy.

Pancreas: Unremarkable. No pancreatic ductal dilatation or
surrounding inflammatory changes.

Spleen: Small calcified splenic granuloma. The spleen is otherwise
unremarkable.

Adrenals/Urinary Tract: The adrenal glands are unremarkable. There
is no hydronephrosis on either side. There is symmetric enhancement
and excretion of contrast by both kidneys. Small bilateral renal
cysts measure up to 2.5 cm in the upper pole of the right kidney.
The visualized ureters and urinary bladder appear unremarkable.

Stomach/Bowel: Scattered sigmoid diverticula without active
inflammatory changes. There is no bowel obstruction or active
inflammation. The appendix is normal.

Vascular/Lymphatic: Mild aortoiliac atherosclerotic disease.
Nonocclusive clot noted in the infrarenal IVC extending into the
left common iliac vein. No portal venous gas. There is no
adenopathy.

Reproductive: The prostate and seminal vesicles are grossly
unremarkable. No pelvic mass.

Other: Small fat containing umbilical and right inguinal hernia.

Musculoskeletal: Degenerative changes of the spine. Total left hip
arthroplasty. No acute osseous pathology.
IMPRESSION: 1. Nonocclusive clot in the infrarenal IVC.
2. Partially visualized large pericardial effusion extending into
the open sternotomy. Chest CT may provide better evaluation.
3. Partially visualized moderate left and small right pleural
effusions with compressive atelectasis of the visualized left lower
lobe.
4. Fatty liver.
5. Sigmoid diverticulosis. No bowel obstruction. Normal appendix.
6. Aortic Atherosclerosis (HP9BL-MB1.1).

These results were called by telephone at the time of interpretation
on 08/02/2021 at [DATE] to the patient's nurse, [REDACTED], who
verbally acknowledged these results.
